# Patient Record
Sex: Male | Born: 1970 | Race: White | Hispanic: No | Marital: Single | Smoking: Current some day smoker
Health system: Southern US, Community
[De-identification: ages and names within clinical notes are randomized; demographics above are authoritative.]

## PROBLEM LIST (undated history)

## (undated) DIAGNOSIS — I509 Heart failure, unspecified: Secondary | ICD-10-CM

## (undated) DIAGNOSIS — M109 Gout, unspecified: Secondary | ICD-10-CM

## (undated) DIAGNOSIS — I10 Essential (primary) hypertension: Secondary | ICD-10-CM

## (undated) HISTORY — PX: HERNIA REPAIR: SHX51

## (undated) HISTORY — DX: Heart failure, unspecified: I50.9

---

## 2007-06-30 ENCOUNTER — Emergency Department (HOSPITAL_COMMUNITY): Admission: EM | Admit: 2007-06-30 | Discharge: 2007-06-30 | Payer: Self-pay | Admitting: Emergency Medicine

## 2008-01-22 ENCOUNTER — Emergency Department (HOSPITAL_COMMUNITY): Admission: EM | Admit: 2008-01-22 | Discharge: 2008-01-22 | Payer: Self-pay | Admitting: Emergency Medicine

## 2008-08-18 ENCOUNTER — Emergency Department (HOSPITAL_COMMUNITY): Admission: EM | Admit: 2008-08-18 | Discharge: 2008-08-19 | Payer: Self-pay | Admitting: Emergency Medicine

## 2008-12-04 ENCOUNTER — Emergency Department (HOSPITAL_COMMUNITY): Admission: EM | Admit: 2008-12-04 | Discharge: 2008-12-04 | Payer: Self-pay | Admitting: Emergency Medicine

## 2009-01-19 ENCOUNTER — Emergency Department (HOSPITAL_COMMUNITY): Admission: EM | Admit: 2009-01-19 | Discharge: 2009-01-19 | Payer: Self-pay | Admitting: Emergency Medicine

## 2010-06-17 ENCOUNTER — Emergency Department (HOSPITAL_COMMUNITY)
Admission: EM | Admit: 2010-06-17 | Discharge: 2010-06-17 | Payer: Self-pay | Source: Home / Self Care | Admitting: Emergency Medicine

## 2010-11-01 LAB — URINALYSIS, ROUTINE W REFLEX MICROSCOPIC
Specific Gravity, Urine: 1.028 (ref 1.005–1.030)
pH: 7.5 (ref 5.0–8.0)

## 2014-10-16 ENCOUNTER — Emergency Department (HOSPITAL_COMMUNITY): Payer: Self-pay

## 2014-10-16 ENCOUNTER — Encounter (HOSPITAL_COMMUNITY): Payer: Self-pay | Admitting: Emergency Medicine

## 2014-10-16 ENCOUNTER — Emergency Department (HOSPITAL_COMMUNITY)
Admission: EM | Admit: 2014-10-16 | Discharge: 2014-10-16 | Disposition: A | Discharge status: Home / Self Care | Payer: Self-pay | Attending: Emergency Medicine | Admitting: Emergency Medicine

## 2014-10-16 DIAGNOSIS — Y9289 Other specified places as the place of occurrence of the external cause: Secondary | ICD-10-CM | POA: Insufficient documentation

## 2014-10-16 DIAGNOSIS — W293XXA Contact with powered garden and outdoor hand tools and machinery, initial encounter: Secondary | ICD-10-CM | POA: Insufficient documentation

## 2014-10-16 DIAGNOSIS — Y998 Other external cause status: Secondary | ICD-10-CM | POA: Insufficient documentation

## 2014-10-16 DIAGNOSIS — S61213A Laceration without foreign body of left middle finger without damage to nail, initial encounter: Secondary | ICD-10-CM | POA: Insufficient documentation

## 2014-10-16 DIAGNOSIS — Z23 Encounter for immunization: Secondary | ICD-10-CM | POA: Insufficient documentation

## 2014-10-16 DIAGNOSIS — Z720 Tobacco use: Secondary | ICD-10-CM | POA: Insufficient documentation

## 2014-10-16 DIAGNOSIS — Y9389 Activity, other specified: Secondary | ICD-10-CM | POA: Insufficient documentation

## 2014-10-16 MED ORDER — CEPHALEXIN 500 MG PO CAPS
500.0000 mg | ORAL_CAPSULE | Freq: Once | ORAL | Status: AC
  Administered 2014-10-16: 500 mg via ORAL
  Filled 2014-10-16: qty 1

## 2014-10-16 MED ORDER — CEPHALEXIN 500 MG PO CAPS: 500.0000 mg | ORAL_CAPSULE | Freq: Four times a day (QID) | ORAL | Status: DC

## 2014-10-16 MED ORDER — OXYCODONE-ACETAMINOPHEN 5-325 MG PO TABS: 1.0000 | ORAL_TABLET | ORAL | Status: DC | PRN

## 2014-10-16 MED ORDER — LIDOCAINE HCL (PF) 1 % IJ SOLN
30.0000 mL | Freq: Once | INTRAMUSCULAR | Status: AC
  Administered 2014-10-16: 30 mL via INTRADERMAL
  Filled 2014-10-16: qty 30

## 2014-10-16 MED ORDER — TETANUS-DIPHTH-ACELL PERTUSSIS 5-2.5-18.5 LF-MCG/0.5 IM SUSP
0.5000 mL | Freq: Once | INTRAMUSCULAR | Status: AC
  Administered 2014-10-16: 0.5 mL via INTRAMUSCULAR
  Filled 2014-10-16: qty 0.5

## 2014-10-16 MED ORDER — OXYCODONE-ACETAMINOPHEN 5-325 MG PO TABS
1.0000 | ORAL_TABLET | Freq: Once | ORAL | Status: AC
Start: 2014-10-16 — End: 2014-10-16
  Administered 2014-10-16: 1 via ORAL
  Filled 2014-10-16: qty 1

## 2014-10-16 NOTE — Discharge Instructions (Signed)
1. Medications: Tylenol or ibuprofen for pain, usual home medications 2. Treatment: ice for swelling, keep wound clean with warm soap and water and keep bandage dry, do not submerge in water for 24 hours 3. Follow Up: Please see Dr. Merlyn LotKuzma in 5 days for wound check or sooner if you have concerns. Return to the emergency department for increased redness, drainage of pus from the wound   WOUND CARE  Keep area clean and dry for 24 hours. Do not remove bandage, if applied.  After 24 hours, remove bandage and wash wound gently with mild soap and warm water. Reapply a new bandage after cleaning wound, if directed.   Continue daily cleansing with soap and water until stitches/staples are removed.  Do not apply any ointments or creams to the wound while stitches/staples are in place, as this may cause delayed healing. Return if you experience any of the following signs of infection: Swelling, redness, pus drainage, streaking, fever >101.0 F  Return if you experience excessive bleeding that does not stop after 15-20 minutes of constant, firm pressure.

## 2014-10-16 NOTE — ED Notes (Signed)
Bed: [id number] Expected date:  Expected time:  Means of arrival:  Comments: For T1

## 2014-10-16 NOTE — ED Notes (Addendum)
Pt using chainsaw to cut a branch and the chainsaw bounced back cutting down left middle finger. Pt diaphoretic and pale.

## 2014-10-16 NOTE — ED Provider Notes (Signed)
CSN: [id number]     Arrival date & time 10/16/14  1506 History   First MD Initiated Contact with Patient 10/16/14 1544     Chief Complaint  Patient presents with  . Finger Injury     (Consider location/radiation/quality/duration/timing/severity/associated sxs/prior Treatment) The history is provided by the patient and medical records. No language interpreter was used.     Kirk Lyons is a 44 y.o. male  with no major medical Hx presents to the Emergency Department complaining of acute laceration to the left little finger onset PTA.  Pt reports he was using a chainsaw when it bounced back and hit his left middle finger.  Pt reports pain to the finger; but no pain to the hand or wrist. He reports full range of motion of all fingers of the hand and wrist. He reports cleaning the wound with water but with large amount of blood reported to the ER for sutures. Unknown last tetanus. Patient denies history of diabetes, steroid use, HIV or other immunosuppression.  He denies history of recurrent infections.  Patient reports he felt "woozy" after it happened but did not pass out.  He reports he was given oxycodone on arrival here in the emergency department which improved his pain significantly and he now feels normal.  Patient denies fever or chills, nausea or vomiting.  History reviewed. No pertinent past medical history. History reviewed. No pertinent past surgical history. No family history on file. History  Substance Use Topics  . Smoking status: Current Some Day Smoker  . Smokeless tobacco: Not on file  . Alcohol Use: Yes     Comment: social    Review of Systems  Constitutional: Negative for fever.  Gastrointestinal: Negative for nausea and vomiting.  Skin: Positive for wound.  Allergic/Immunologic: Negative for immunocompromised state.  Neurological: Negative for weakness and numbness.  Hematological: Does not bruise/bleed easily.  Psychiatric/Behavioral: The patient is not  nervous/anxious.       Allergies  Erythromycin  Home Medications   Prior to Admission medications   Medication Sig Start Date End Date Taking? Authorizing Provider  feeding supplement (ENSURE CLINICAL STRENGTH) LIQD Take 237 mLs by mouth daily as needed (nutrition).   Yes Historical Provider, MD  Multiple Vitamins-Minerals (EMERGEN-C VITAMIN C) PACK Take 1 Package by mouth as needed (immune system support).   Yes Historical Provider, MD  cephALEXin (KEFLEX) 500 MG capsule Take 1 capsule (500 mg total) by mouth 4 (four) times daily. 10/16/14   Rolene Andrades, PA-C  oxyCODONE-acetaminophen (PERCOCET/ROXICET) 5-325 MG per tablet Take 1-2 tablets by mouth every 4 (four) hours as needed for severe pain. 10/16/14   Carman Essick, PA-C   BP 147/102 mmHg  Pulse 80  Temp(Src) 98.1 F (36.7 C) (Oral)  Resp 20  Wt 205 lb (92.987 kg)  SpO2 96% Physical Exam  Constitutional: He is oriented to person, place, and time. He appears well-developed and well-nourished. No distress.  HENT:  Head: Normocephalic and atraumatic.  Eyes: Conjunctivae are normal. No scleral icterus.  Neck: Normal range of motion.  Cardiovascular: Normal rate, regular rhythm, normal heart sounds and intact distal pulses.   No murmur heard. Capillary refill < 3 sec  Pulmonary/Chest: Effort normal. No respiratory distress.  Musculoskeletal: Normal range of motion. He exhibits no edema.  Full range of motion of all fingers on the left hand including left middle finger 9 cm laceration along the medial portion of the left finger  Neurological: He is alert and oriented to person, place,  and time.  Sensation: Intact to dull and sharp throughout finger with subjectively decreased sensation and wound edges Strength: 5/5 with resisted flexion extension abduction and adduction of the left middle finger  Skin: Skin is warm and dry. He is not diaphoretic.  Psychiatric: He has a normal mood and affect.  Nursing note and  vitals reviewed.   ED Course  LACERATION REPAIR Date/Time: 10/16/2014 6:39 PM Performed by: Dierdre Forth Authorized by: Dierdre Forth Consent: Verbal consent obtained. Risks and benefits: risks, benefits and alternatives were discussed Consent given by: patient Patient understanding: patient states understanding of the procedure being performed Patient consent: the patient's understanding of the procedure matches consent given Procedure consent: procedure consent matches procedure scheduled Relevant documents: relevant documents present and verified Site marked: the operative site was marked Imaging studies: imaging studies available Required items: required blood products, implants, devices, and special equipment available Patient identity confirmed: verbally with patient and arm band Time out: Immediately prior to procedure a "time out" was called to verify the correct patient, procedure, equipment, support staff and site/side marked as required. Body area: upper extremity Location details: left long finger Laceration length: 9 cm Foreign bodies: no foreign bodies Tendon involvement: none Nerve involvement: superficial Vascular damage: no Anesthesia: digital block Local anesthetic: lidocaine 1% without epinephrine Anesthetic total: 9 ml Patient sedated: no Preparation: Patient was prepped and draped in the usual sterile fashion. Irrigation solution: saline Irrigation method: syringe Amount of cleaning: extensive Debridement: minimal Wound skin closure material used: 4-0 vicryl rapide. Number of sutures: 10 Technique: simple Approximation: close Approximation difficulty: complex Dressing: pressure dressing and splint Patient tolerance: Patient tolerated the procedure well with no immediate complications   (including critical care time) Labs Review Labs Reviewed - No data to display  Imaging Review Dg Hand Complete Left  10/16/2014   CLINICAL DATA:   Finger injury  EXAM: LEFT HAND - COMPLETE 3+ VIEW  COMPARISON:  None.  FINDINGS: Three views of the left hand submitted. No acute fracture or subluxation. Study is limited by bandage material artifact third finger.  IMPRESSION: No acute fracture or subluxation. Bandage material artifact third finger.   Electronically Signed   By: Natasha Mead M.D.   On: 10/16/2014 15:33     EKG Interpretation None      MDM   Final diagnoses:  Laceration of left middle finger w/o foreign body w/o damage to nail, initial encounter  Contact with chainsaw as cause of accidental injury   Kirk Lyons presents with large laceration to the left middle finger from chainsaw. Nursing note reports patient was diaphoretic and pale on arrival however his skin is warm and dry and he is alert and oriented upon my assessment.  Full range of motion and sensation intact. No evidence of significant nerve damage or tendon displacement. No involvement of the fingernail.  Wound repaired without difficulty. Tetanus updated. Patient will be discharged home with Keflex for prophylaxis.  Dissolvable sutures placed and patient started to follow-up with hand surgery for wound check.  I have personally reviewed patient's vitals, nursing note and any pertinent labs or imaging.  I performed an undressed physical exam.    It has been determined that no acute conditions requiring further emergency intervention are present at this time. The patient/guardian have been advised of the diagnosis and plan. I reviewed all labs and imaging including any potential incidental findings. We have discussed signs and symptoms that warrant return to the ED and they are listed in the  discharge instructions.    Vital signs are stable at discharge.   BP 147/102 mmHg  Pulse 80  Temp(Src) 98.1 F (36.7 C) (Oral)  Resp 20  Wt 205 lb (92.987 kg)  SpO2 96%        Dierdre ForthHannah Jaaliyah Lucatero, PA-C 10/16/14 1844  Tilden FossaElizabeth Rees, MD 10/16/14 2333

## 2015-02-23 ENCOUNTER — Encounter (HOSPITAL_COMMUNITY): Payer: Self-pay | Admitting: Emergency Medicine

## 2015-02-23 ENCOUNTER — Emergency Department (HOSPITAL_COMMUNITY)
Admission: EM | Admit: 2015-02-23 | Discharge: 2015-02-23 | Disposition: A | Discharge status: Home / Self Care | Payer: Self-pay | Attending: Emergency Medicine | Admitting: Emergency Medicine

## 2015-02-23 DIAGNOSIS — K047 Periapical abscess without sinus: Secondary | ICD-10-CM | POA: Insufficient documentation

## 2015-02-23 DIAGNOSIS — Z792 Long term (current) use of antibiotics: Secondary | ICD-10-CM | POA: Insufficient documentation

## 2015-02-23 DIAGNOSIS — Z87891 Personal history of nicotine dependence: Secondary | ICD-10-CM | POA: Insufficient documentation

## 2015-02-23 MED ORDER — TRAMADOL HCL 50 MG PO TABS
50.0000 mg | ORAL_TABLET | Freq: Once | ORAL | Status: AC
  Administered 2015-02-23: 50 mg via ORAL
  Filled 2015-02-23: qty 1

## 2015-02-23 MED ORDER — PENICILLIN V POTASSIUM 500 MG PO TABS
500.0000 mg | ORAL_TABLET | Freq: Once | ORAL | Status: AC
  Administered 2015-02-23: 500 mg via ORAL
  Filled 2015-02-23: qty 1

## 2015-02-23 MED ORDER — PENICILLIN V POTASSIUM 500 MG PO TABS: 500.0000 mg | ORAL_TABLET | Freq: Four times a day (QID) | ORAL | Status: DC

## 2015-02-23 NOTE — ED Notes (Signed)
Pt is c/o dental pain on the left lower side  Pt states he has swelling in his jaw  Pt is c/o pain to his jaw up to his ear and to his eye on the left side

## 2015-02-23 NOTE — ED Notes (Signed)
Bed: WTR9 Expected date:  Expected time:  Means of arrival:  Comments: 

## 2015-02-23 NOTE — Progress Notes (Signed)
EDCM spoke to patient at bedside. Patient confirms he does not have a pcp or insurance living in [place].  EDCM provide patient with pamphlet to [facility], informed patient of services there and walk in times.  EDCM also provided patient with list of pcps who accept self pay patients, list of discount pharmacies and websites needymeds.org and GoodRX.com for medication assistance, phone number to inquire about the orange card, phone number to inquire about Mediciad, phone number to inquire about the Affordable Care Act, financial resources in the community such as local churches, salvation army, urban ministries, and dental assistance for uninsured patients.  Patient thankful for resources.  No further EDCM needs at this time.  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  He reports he has "family money" assisting him.  Patient reports he has his own dentist.  [facility] informed patient that his penicillin rx is free at [facility].

## 2015-02-23 NOTE — Discharge Instructions (Signed)
Dental Abscess A dental abscess is a collection of infected fluid (pus) from a bacterial infection in the inner part of the tooth (pulp). It usually occurs at the end of the tooth's root.  CAUSES   Severe tooth decay.  Trauma to the tooth that allows bacteria to enter into the pulp, such as a broken or chipped tooth. SYMPTOMS   Severe pain in and around the infected tooth.  Swelling and redness around the abscessed tooth or in the mouth or face.  Tenderness.  Pus drainage.  Bad breath.  Bitter taste in the mouth.  Difficulty swallowing.  Difficulty opening the mouth.  Nausea.  Vomiting.  Chills.  Swollen neck glands. DIAGNOSIS   A medical and dental history will be taken.  An examination will be performed by tapping on the abscessed tooth.  X-rays may be taken of the tooth to identify the abscess. TREATMENT The goal of treatment is to eliminate the infection. You may be prescribed antibiotic medicine to stop the infection from spreading. A root canal may be performed to save the tooth. If the tooth cannot be saved, it may be pulled (extracted) and the abscess may be drained.  HOME CARE INSTRUCTIONS  Only take over-the-counter or prescription medicines for pain, fever, or discomfort as directed by your caregiver.  Rinse your mouth (gargle) often with salt water ( tsp salt in 8 oz [250 ml] of warm water) to relieve pain or swelling.  Do not drive after taking pain medicine (narcotics).  Do not apply heat to the outside of your face.  Return to your dentist for further treatment as directed. SEEK MEDICAL CARE IF:  Your pain is not helped by medicine.  Your pain is getting worse instead of better. SEEK IMMEDIATE MEDICAL CARE IF:  You have a fever or persistent symptoms for more than 2-3 days.  You have a fever and your symptoms suddenly get worse.  You have chills or a very bad headache.  You have problems breathing or swallowing.  You have trouble  opening your mouth.  You have swelling in the neck or around the eye. Document Released: 07/03/2005 Document Revised: 03/27/2012 Document Reviewed: 10/11/2010 [facility] Patient Information 2015 [place], [place]. This information is not intended to replace advice given to you by your health care provider. Make sure you discuss any questions you have with your health care provider. You have been given a prescription for penicillin is my understanding that is free at [employer].  You just  need to present the prescription You've also been given a list of resources

## 2015-02-23 NOTE — ED Provider Notes (Signed)
CSN: [id number]     Arrival date & time 02/23/15  2208 History  This chart was scribed for Earley Favor, NP, working with Marily Memos, MD by Octavia Heir, ED Scribe. This patient was seen in room WTR9/WTR9 and the patient's care was started at 10:34 PM.    Chief Complaint  Patient presents with  . Dental Pain      The history is provided by the patient. No language interpreter was used.   HPI Comments: Kirk Lyons is a 44 y.o. male who presents to the Emergency Department complaining of sudden onset, gradual worsening lower left sided dental pain onset 2 weeks ago. He notes associated swelling in his jaw and reports the pain radiates up his left jaw, ear and eye. Pt went to the dentist and was told her had an abscess on the lower left side of his mouth. He was not given an antibiotic and notes he cannot afford to go back.  History reviewed. No pertinent past medical history. History reviewed. No pertinent past surgical history. History reviewed. No pertinent family history. History  Substance Use Topics  . Smoking status: Former Games developer  . Smokeless tobacco: Not on file  . Alcohol Use: Yes     Comment: social    Review of Systems  HENT: Positive for dental problem and facial swelling.   All other systems reviewed and are negative.     Allergies  Erythromycin  Home Medications   Prior to Admission medications   Medication Sig Start Date End Date Taking? Authorizing Provider  cephALEXin (KEFLEX) 500 MG capsule Take 1 capsule (500 mg total) by mouth 4 (four) times daily. 10/16/14   Hannah Muthersbaugh, PA-C  feeding supplement (ENSURE CLINICAL STRENGTH) LIQD Take 237 mLs by mouth daily as needed (nutrition).    Historical Provider, MD  Multiple Vitamins-Minerals (EMERGEN-C VITAMIN C) PACK Take 1 Package by mouth as needed (immune system support).    Historical Provider, MD  oxyCODONE-acetaminophen (PERCOCET/ROXICET) 5-325 MG per tablet Take 1-2 tablets by mouth every 4 (four)  hours as needed for severe pain. 10/16/14   Hannah Muthersbaugh, PA-C   Triage vitals: BP 142/110 mmHg  Pulse 89  Temp(Src) 98.8 F (37.1 C) (Oral)  Resp 18  SpO2 98% Physical Exam  Constitutional: He is oriented to person, place, and time. He appears well-developed and well-nourished. No distress.  HENT:  Head: Normocephalic and atraumatic.  Submental nodes, minimal swelling on the lateral side of the 1st and 2nd molar on left bottom  Eyes: Conjunctivae and EOM are normal.  Neck: Normal range of motion. Neck supple.  Anterior cervical nodes  Cardiovascular: Normal rate, regular rhythm and normal heart sounds.   Pulmonary/Chest: Effort normal and breath sounds normal.  Musculoskeletal: Normal range of motion. He exhibits no edema.  Lymphadenopathy:    He has cervical adenopathy.  Neurological: He is alert and oriented to person, place, and time.  Skin: Skin is warm and dry.  Psychiatric: He has a normal mood and affect. His behavior is normal.  Nursing note and vitals reviewed.   ED Course  Procedures  DIAGNOSTIC STUDIES: Oxygen Saturation is 98% on RA, normal by my interpretation.  COORDINATION OF CARE:  10:36 PM Discussed treatment plan which includes antibiotics and resource list of other dentists with pt at bedside and pt agreed to plan.  Labs Review Labs Reviewed - No data to display  Imaging Review No results found.   EKG Interpretation None     Patient was given  a prescription for penicillin, which from my understanding is free of hair is Agricultural consultant.  He's been informed of this, the case manager has also given him a list of resources MDM   Final diagnoses:  None   I personally performed the services described in this documentation, which was scribed in my presence. The recorded information has been reviewed and is accurate.   Earley Favor, NP 02/23/15 2245  Earley Favor, NP 02/23/15 1610  Marily Memos, MD 02/25/15 [id number]

## 2016-11-23 ENCOUNTER — Emergency Department (HOSPITAL_COMMUNITY)
Admission: EM | Admit: 2016-11-23 | Discharge: 2016-11-23 | Disposition: A | Discharge status: Home / Self Care | Payer: Self-pay | Attending: Emergency Medicine | Admitting: Emergency Medicine

## 2016-11-23 ENCOUNTER — Encounter (HOSPITAL_COMMUNITY): Payer: Self-pay

## 2016-11-23 DIAGNOSIS — F419 Anxiety disorder, unspecified: Secondary | ICD-10-CM | POA: Insufficient documentation

## 2016-11-23 DIAGNOSIS — Z87891 Personal history of nicotine dependence: Secondary | ICD-10-CM | POA: Insufficient documentation

## 2016-11-23 DIAGNOSIS — R0981 Nasal congestion: Secondary | ICD-10-CM | POA: Insufficient documentation

## 2016-11-23 MED ORDER — HYDROXYZINE HCL 50 MG PO TABS: 50.0000 mg | ORAL_TABLET | Freq: Four times a day (QID) | ORAL | 0 refills | Status: DC | PRN

## 2016-11-23 MED ORDER — HYDROXYZINE HCL 25 MG PO TABS
50.0000 mg | ORAL_TABLET | Freq: Once | ORAL | Status: AC
  Administered 2016-11-23: 50 mg via ORAL
  Filled 2016-11-23: qty 2

## 2016-11-23 NOTE — Discharge Instructions (Signed)
Continue to stay well-hydrated. Use vistaril as directed as needed for congestion as well as anxiety. Continue to alternate between Tylenol and Ibuprofen for pain or fever. Use netipot and flonase to help with nasal congestion.  Follow up with [facility] in 5-7 days for recheck of ongoing symptoms and to establish medical care, and for ongoing management of your anxiety and congestion. Return to emergency department for emergent changing or worsening of symptoms.

## 2016-11-23 NOTE — ED Triage Notes (Signed)
Pt from home via EMS with complaints of anxiety x 25 years but states this has gotten worse over the past 3-4 months. Pt states he has untreated sleep apnea and he is scared to sleep which exacerbates his anxiety.
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
## 2016-11-23 NOTE — ED Notes (Signed)
Pt reports greatly decreased anxiety.  Getting ride home w/a friend.

## 2019-05-10 ENCOUNTER — Other Ambulatory Visit: Payer: Self-pay

## 2019-05-10 ENCOUNTER — Encounter: Payer: Self-pay | Admitting: Emergency Medicine

## 2019-05-10 ENCOUNTER — Emergency Department
Admission: EM | Admit: 2019-05-10 | Discharge: 2019-05-10 | Disposition: A | Discharge status: Home / Self Care | Payer: Self-pay | Attending: Emergency Medicine | Admitting: Emergency Medicine

## 2019-05-10 DIAGNOSIS — Z79899 Other long term (current) drug therapy: Secondary | ICD-10-CM | POA: Insufficient documentation

## 2019-05-10 DIAGNOSIS — L03012 Cellulitis of left finger: Secondary | ICD-10-CM | POA: Insufficient documentation

## 2019-05-10 DIAGNOSIS — Z87891 Personal history of nicotine dependence: Secondary | ICD-10-CM | POA: Insufficient documentation

## 2019-05-10 DIAGNOSIS — I10 Essential (primary) hypertension: Secondary | ICD-10-CM | POA: Insufficient documentation

## 2019-05-10 MED ORDER — CEPHALEXIN 500 MG PO CAPS
500.0000 mg | ORAL_CAPSULE | Freq: Once | ORAL | Status: AC
Start: 2019-05-10 — End: 2019-05-10
  Administered 2019-05-10: 21:00:00 500 mg via ORAL
  Filled 2019-05-10: qty 1

## 2019-05-10 MED ORDER — CEPHALEXIN 500 MG PO CAPS: 500.0000 mg | ORAL_CAPSULE | Freq: Three times a day (TID) | ORAL | 0 refills | Status: DC

## 2019-05-10 NOTE — ED Provider Notes (Signed)
[facility] Emergency Department Provider Note ____________________________________________  Time seen: 1936  I have reviewed the triage vital signs and the nursing notes.  HISTORY  Chief Complaint  Hand Pain  HPI Kirk Lyons is a 48 y.o. male presents himself to the ED for evaluation of pain to the  cuticle of the fourth digit on his left hand.  He describes pain over the last 4 days.  He admits that when the cuticle initially got infected, he did with a sterile needle and express purulent drainage.  It was improving until he subsequently bumped the finger, and recollected.  He presents now with significant swelling to the cuticle of the fourth finger.  He denies any other injury at this time.  History reviewed. No pertinent past medical history.  There are no active problems to display for this patient.   Past Surgical History:  Procedure Laterality Date  . HERNIA REPAIR      Prior to Admission medications   Medication Sig Start Date End Date Taking? Authorizing Provider  cephALEXin (KEFLEX) 500 MG capsule Take 1 capsule (500 mg total) by mouth 3 (three) times daily. 05/10/19   Kaarin Pardy, Charlesetta Ivory, PA-C  feeding supplement (ENSURE CLINICAL STRENGTH) LIQD Take 237 mLs by mouth daily as needed (nutrition).    [provider]  Multiple Vitamins-Minerals (EMERGEN-C VITAMIN C) PACK Take 1 Package by mouth as needed (immune system support).    [provider]    Allergies Erythromycin  No family history on file.  Social History Social History   Tobacco Use  . Smoking status: Former Games developer  . Smokeless tobacco: Never Used  Substance Use Topics  . Alcohol use: Yes    Comment: social  . Drug use: No    Review of Systems  Constitutional: Negative for fever. Cardiovascular: Negative for chest pain. Respiratory: Negative for shortness of breath. Musculoskeletal: Negative for back pain.  Left ring finger pain as above. Skin:  Negative for rash. Neurological: Negative for headaches, focal weakness or numbness. ____________________________________________  PHYSICAL EXAM:  VITAL SIGNS: ED Triage Vitals  Enc Vitals Group     BP 05/10/19 1817 (!) 156/100     Pulse Rate 05/10/19 1817 100     Resp 05/10/19 1817 20     Temp 05/10/19 1817 98.7 F (37.1 C)     Temp Source 05/10/19 1817 Oral     SpO2 05/10/19 1817 96 %     Weight 05/10/19 1818 240 lb (108.9 kg)     Height 05/10/19 1818 6\' 2"  (1.88 m)     Head Circumference --      Peak Flow --      Pain Score 05/10/19 1818 6     Pain Loc --      Pain Edu? --      Excl. in GC? --     Constitutional: Alert and oriented. Well appearing and in no distress. Head: Normocephalic and atraumatic. Eyes: Conjunctivae are normal.  Normal extraocular movements Cardiovascular: Normal rate, regular rhythm. Normal distal pulses. Respiratory: Normal respiratory effort. No wheezes/rales/rhonchi. Musculoskeletal: left hand with focal erythema and paronychia to the cuticle. Local edema to the distal finger. Nontender with normal range of motion in all extremities.  Neurologic:  Normal gait without ataxia. Normal speech and language. No gross focal neurologic deficits are appreciated. Skin:  Skin is warm, dry and intact. No rash noted. Psychiatric: Mood and affect are normal. Patient exhibits appropriate insight and judgment. ____________________________________________  PROCEDURES  Keflex 500 mg PO .Marland KitchenIncision and Drainage  Date/Time: 05/10/2019 8:16 PM Performed by: Melvenia Needles, PA-C Authorized by: Melvenia Needles, PA-C   Consent:    Consent obtained:  Verbal   Consent given by:  Patient   Risks discussed:  Bleeding, infection and incomplete drainage   Alternatives discussed:  Alternative treatment Location:    Indications for incision and drainage: paronychia.   Location:  Upper extremity   Upper extremity location:  Finger   Finger location:   L ring finger Pre-procedure details:    Skin preparation:  Chloraprep Anesthesia (see MAR for exact dosages):    Anesthesia method:  None Procedure type:    Complexity:  Simple Procedure details:    Needle aspiration: no     Incision types:  Stab incision   Incision depth:  Dermal   Scalpel size: 18 G needle.   Wound management:  Irrigated with saline   Drainage:  Purulent   Drainage amount:  Scant   Wound treatment:  Wound left open   Packing materials:  None Post-procedure details:    Patient tolerance of procedure:  Tolerated well, no immediate complications  ____________________________________________  INITIAL IMPRESSION / ASSESSMENT AND PLAN / ED COURSE  Patient with ED evaluation of left finger pain and swelling at the cuticle.  Patient presents with focal pus collection to the cuticle and surrounding erythema.  The paronychia is drained using a local incision with a 19-gauge needle.  Patient is placed on Keflex empirically, because his injury has been persistent and recurrent for the last 4 weeks.  He will keep the wound clean, dry, covered, and return to the ED as needed. He will return to the ED for further evaluation of BP. He declined treatment at this time.   Kirk Lyons was evaluated in Emergency Department on 05/10/2019 for the symptoms described in the history of present illness. He was evaluated in the context of the global COVID-19 pandemic, which necessitated consideration that the patient might be at risk for infection with the SARS-CoV-2 virus that causes COVID-19. Institutional protocols and algorithms that pertain to the evaluation of patients at risk for COVID-19 are in a state of rapid change based on information released by regulatory bodies including the CDC and federal and state organizations. These policies and algorithms were followed during the patient's care in the ED. ____________________________________________  FINAL CLINICAL IMPRESSION(S) / ED  DIAGNOSES  Final diagnoses:  Paronychia of left ring finger  Uncontrolled hypertension      Jennye Runquist, Dannielle Karvonen, PA-C 05/10/19 2052    Duffy Bruce, MD 05/12/19 [phone]

## 2019-05-10 NOTE — ED Triage Notes (Signed)
Pain and swelling around 4th digit nailbed L hand x 4 days.

## 2019-05-10 NOTE — Discharge Instructions (Signed)
Keep the wound clean, dry, and covered. Take the antibiotic as directed. Consider epsom-salt soaks to promote healing. Return as needed for worsening swelling to the finger pad.

## 2019-06-11 ENCOUNTER — Encounter: Payer: Self-pay | Admitting: Emergency Medicine

## 2019-06-11 ENCOUNTER — Other Ambulatory Visit: Payer: Self-pay

## 2019-06-11 DIAGNOSIS — Z79899 Other long term (current) drug therapy: Secondary | ICD-10-CM | POA: Insufficient documentation

## 2019-06-11 DIAGNOSIS — M549 Dorsalgia, unspecified: Secondary | ICD-10-CM | POA: Insufficient documentation

## 2019-06-11 DIAGNOSIS — Z87891 Personal history of nicotine dependence: Secondary | ICD-10-CM | POA: Insufficient documentation

## 2019-06-11 DIAGNOSIS — I10 Essential (primary) hypertension: Secondary | ICD-10-CM | POA: Insufficient documentation

## 2019-06-11 DIAGNOSIS — J189 Pneumonia, unspecified organism: Secondary | ICD-10-CM | POA: Insufficient documentation

## 2019-06-11 LAB — BASIC METABOLIC PANEL
Anion gap: 11 (ref 5–15)
BUN: 12 mg/dL (ref 6–20)
CO2: 25 mmol/L (ref 22–32)
Calcium: 8.9 mg/dL (ref 8.9–10.3)
Chloride: 104 mmol/L (ref 98–111)
Creatinine, Ser: 1.02 mg/dL (ref 0.61–1.24)
GFR calc Af Amer: >60 mL/min (ref >60)
GFR calc non Af Amer: >60 mL/min (ref >60)
Glucose, Bld: 138 mg/dL — ABNORMAL HIGH (ref 70–99)
Potassium: 3.8 mmol/L (ref 3.5–5.1)
Sodium: 140 mmol/L (ref 135–145)

## 2019-06-11 LAB — CBC
HCT: 47.6 % (ref 39.0–52.0)
Hemoglobin: 16.2 g/dL (ref 13.0–17.0)
MCH: 32 pg (ref 26.0–34.0)
MCHC: 34 g/dL (ref 30.0–36.0)
MCV: 94.1 fL (ref 80.0–100.0)
Platelets: 342 10*3/uL (ref 150–400)
RBC: 5.06 MIL/uL (ref 4.22–5.81)
RDW: 11.9 % (ref 11.5–15.5)
WBC: 11.1 10*3/uL — ABNORMAL HIGH (ref 4.0–10.5)
nRBC: 0 % (ref 0.0–0.2)

## 2019-06-11 LAB — TROPONIN I (HIGH SENSITIVITY): Troponin I (High Sensitivity): 13 ng/L (ref <18)

## 2019-06-11 NOTE — ED Triage Notes (Signed)
Pt c/o mid back pain, states hx of pneumonia and feels the same. Pt states fever this am. Denies SOB

## 2019-06-12 ENCOUNTER — Emergency Department
Admission: EM | Admit: 2019-06-12 | Discharge: 2019-06-12 | Disposition: A | Discharge status: Home / Self Care | Payer: Self-pay | Attending: Emergency Medicine | Admitting: Emergency Medicine

## 2019-06-12 ENCOUNTER — Emergency Department: Payer: Self-pay

## 2019-06-12 DIAGNOSIS — R509 Fever, unspecified: Secondary | ICD-10-CM

## 2019-06-12 DIAGNOSIS — R0602 Shortness of breath: Secondary | ICD-10-CM

## 2019-06-12 DIAGNOSIS — I10 Essential (primary) hypertension: Secondary | ICD-10-CM

## 2019-06-12 DIAGNOSIS — J189 Pneumonia, unspecified organism: Secondary | ICD-10-CM

## 2019-06-12 LAB — TROPONIN I (HIGH SENSITIVITY): Troponin I (High Sensitivity): 10 ng/L (ref <18)

## 2019-06-12 LAB — URINALYSIS, COMPLETE (UACMP) WITH MICROSCOPIC
Bacteria, UA: NONE SEEN
Bilirubin Urine: NEGATIVE
Glucose, UA: NEGATIVE mg/dL
Hgb urine dipstick: NEGATIVE
Ketones, ur: NEGATIVE mg/dL
Leukocytes,Ua: NEGATIVE
Nitrite: NEGATIVE
Protein, ur: 100 mg/dL — AB
Specific Gravity, Urine: 1.024 (ref 1.005–1.030)
pH: 5 (ref 5.0–8.0)

## 2019-06-12 LAB — LACTIC ACID, PLASMA: Lactic Acid, Venous: 1.5 mmol/L (ref 0.5–1.9)

## 2019-06-12 MED ORDER — HYDROCOD POLST-CPM POLST ER 10-8 MG/5ML PO SUER
5.0000 mL | Freq: Once | ORAL | Status: AC
  Administered 2019-06-12: 5 mL via ORAL
  Filled 2019-06-12: qty 5

## 2019-06-12 MED ORDER — HYDROCOD POLST-CPM POLST ER 10-8 MG/5ML PO SUER: 5.0000 mL | Freq: Two times a day (BID) | ORAL | 0 refills | Status: DC

## 2019-06-12 MED ORDER — SODIUM CHLORIDE 0.9 % IV BOLUS
1000.0000 mL | Freq: Once | INTRAVENOUS | Status: AC
  Administered 2019-06-12: 06:00:00 1000 mL via INTRAVENOUS

## 2019-06-12 MED ORDER — ALBUTEROL SULFATE HFA 108 (90 BASE) MCG/ACT IN AERS: 2.0000 | INHALATION_SPRAY | RESPIRATORY_TRACT | 0 refills | Status: DC | PRN

## 2019-06-12 MED ORDER — SODIUM CHLORIDE 0.9 % IV SOLN
1.0000 g | Freq: Once | INTRAVENOUS | Status: AC
  Administered 2019-06-12: 06:00:00 1 g via INTRAVENOUS
  Filled 2019-06-12: qty 10

## 2019-06-12 MED ORDER — AMOXICILLIN-POT CLAVULANATE 875-125 MG PO TABS: 1.0000 | ORAL_TABLET | Freq: Two times a day (BID) | ORAL | 0 refills | Status: DC

## 2019-06-12 NOTE — ED Notes (Signed)
Pt placed on 2L due to desat while sleeping. Pt awakens easily, once again states hx of sleep apnea.

## 2019-06-12 NOTE — ED Notes (Signed)
Pt awake and calling for a ride at this time.

## 2019-06-12 NOTE — ED Notes (Signed)
Pt verbalizes understanding of d/c instructions, medications and follow up 

## 2019-06-12 NOTE — Discharge Instructions (Addendum)
1.  Take antibiotic as prescribed (Augmentin 875 mg twice daily x7 days). 2.  Please recheck your blood pressure after you are feeling better. 3.  You may use Tussionex as needed for cough. 4.  Use Albuterol inhaler 2 puffs every 4 hours as needed for cough/wheezing/difficulty breathing. 5.  Return to the ER for worsening symptoms, persistent vomiting, difficulty breathing or other concerns.

## 2019-06-12 NOTE — ED Notes (Addendum)
This RN at bedside at this time obtaining D/C VS. Pt noted to be 88-92% on RA. EDP made aware. Per EDP leave pt off O2 and he will come to bedside to assess patient. EDP at also aware that patient remains hypertensive at this time, per EDP follow up with PCP.

## 2019-06-12 NOTE — ED Notes (Signed)
This RN to bedside at this time, introduced self to patient. Pt noted to be asleep, states hx of sleep apnea, c/o HA, states "it's everyday when I wake up I have a headache because of the sleep apnea". Pt denies any further needs, upon awakening pt's O2 sats back to 95%.

## 2019-06-12 NOTE — ED Provider Notes (Signed)
[facility] Emergency Department Provider Note   ____________________________________________   First MD Initiated Contact with Patient 06/12/19 [phone]     (approximate)  I have reviewed the triage vital signs and the nursing notes.   HISTORY  Chief Complaint Back Pain and Fever    HPI Kirk Lyons is a 48 y.o. male who presents to the ED from home with a chief complaint of mid back pain.  Patient reports a 1.5-week history of cough and chest tightness.  Subjective fever and mid back pain today.  History of pneumonia with similar symptoms.  Denies COVID-19 exposure.  Denies wheezing, shortness of breath, abdominal pain, nausea, vomiting or diarrhea.  Denies recent travel or trauma.       Past medical history Pneumonia  There are no active problems to display for this patient.   Past Surgical History:  Procedure Laterality Date  . HERNIA REPAIR      Prior to Admission medications   Medication Sig Start Date End Date Taking? Authorizing Provider  albuterol (VENTOLIN HFA) 108 (90 Base) MCG/ACT inhaler Inhale 2 puffs into the lungs every 4 (four) hours as needed for wheezing or shortness of breath. 06/12/19   Irean Hong, MD  amoxicillin-clavulanate (AUGMENTIN) 875-125 MG tablet Take 1 tablet by mouth 2 (two) times daily. 06/12/19   Irean Hong, MD  cephALEXin (KEFLEX) 500 MG capsule Take 1 capsule (500 mg total) by mouth 3 (three) times daily. 05/10/19   Menshew, Charlesetta Ivory, PA-C  chlorpheniramine-HYDROcodone (TUSSIONEX PENNKINETIC ER) 10-8 MG/5ML SUER Take 5 mLs by mouth 2 (two) times daily. 06/12/19   Irean Hong, MD  feeding supplement (ENSURE CLINICAL STRENGTH) LIQD Take 237 mLs by mouth daily as needed (nutrition).    [provider]  Multiple Vitamins-Minerals (EMERGEN-C VITAMIN C) PACK Take 1 Package by mouth as needed (immune system support).    [provider]    Allergies Erythromycin  No family history on file.   Social History Social History   Tobacco Use  . Smoking status: Former Games developer  . Smokeless tobacco: Never Used  Substance Use Topics  . Alcohol use: Yes    Comment: social  . Drug use: No    Review of Systems  Constitutional: Positive for subjective fever. Eyes: No visual changes. ENT: No sore throat. Cardiovascular: Positive for chest tightness. Respiratory: Positive for nonproductive cough.  Denies shortness of breath. Gastrointestinal: No abdominal pain.  No nausea, no vomiting.  No diarrhea.  No constipation. Genitourinary: Negative for dysuria. Musculoskeletal: Negative for back pain. Skin: Negative for rash. Neurological: Negative for headaches, focal weakness or numbness.   ____________________________________________   PHYSICAL EXAM:  VITAL SIGNS: ED Triage Vitals  Enc Vitals Group     BP 06/11/19 2207 (!) 147/98     Pulse Rate 06/11/19 2207 (!) 115     Resp 06/11/19 2207 16     Temp 06/11/19 2207 98.4 F (36.9 C)     Temp Source 06/11/19 2207 Oral     SpO2 06/11/19 2207 94 %     Weight --      Height --      Head Circumference --      Peak Flow --      Pain Score 06/11/19 2205 3     Pain Loc --      Pain Edu? --      Excl. in GC? --     Constitutional: Alert and oriented. Well appearing and in  no acute distress. Eyes: Conjunctivae are normal. PERRL. EOMI. Head: Atraumatic. Nose: No congestion/rhinnorhea. Mouth/Throat: Mucous membranes are moist.  Oropharynx non-erythematous. Neck: No stridor.  Supple neck without meningismus. Cardiovascular: Normal rate, regular rhythm. Grossly normal heart sounds.  Good peripheral circulation. Respiratory: Normal respiratory effort.  No retractions. Lungs slightly diminished bibasilarly. Gastrointestinal: Soft and nontender to light or deep palpation. No distention. No abdominal bruits. No CVA tenderness. Musculoskeletal: No lower extremity tenderness nor edema.  No joint effusions. Neurologic:  Normal speech  and language. No gross focal neurologic deficits are appreciated. No gait instability. Skin:  Skin is warm, dry and intact. No rash noted.  No petechiae. Psychiatric: Mood and affect are normal. Speech and behavior are normal.  ____________________________________________   LABS (all labs ordered are listed, but only abnormal results are displayed)  Labs Reviewed  BASIC METABOLIC PANEL - Abnormal; Notable for the following components:      Result Value   Glucose, Bld 138 (*)    All other components within normal limits  CBC - Abnormal; Notable for the following components:   WBC 11.1 (*)    All other components within normal limits  URINALYSIS, COMPLETE (UACMP) WITH MICROSCOPIC - Abnormal; Notable for the following components:   Color, Urine YELLOW (*)    APPearance HAZY (*)    Protein, ur 100 (*)    All other components within normal limits  CULTURE, BLOOD (ROUTINE X 2)  CULTURE, BLOOD (ROUTINE X 2)  LACTIC ACID, PLASMA  LACTIC ACID, PLASMA  TROPONIN I (HIGH SENSITIVITY)  TROPONIN I (HIGH SENSITIVITY)  TROPONIN I (HIGH SENSITIVITY)   ____________________________________________  EKG  ED ECG REPORT I, , J, the attending physician, personally viewed and interpreted this ECG.   Date: 06/12/2019  EKG Time: 2210  Rate: 117  Rhythm: sinus tachycardia  Axis: Normal  Intervals:none  ST&T Change: Nonspecific  ____________________________________________  RADIOLOGY  ED MD interpretation: Bibasilar pneumonia  Official radiology report(s): Dg Chest 1 View  Result Date: 06/12/2019 CLINICAL DATA:  Mid back pain, history of pneumonia with similar sensation EXAM: CHEST  1 VIEW COMPARISON:  Radiograph 08/18/2008 FINDINGS: Streaky areas of opacity are present in the bases with some mild airways thickening. More bandlike opacity in the right lung base is compatible with subsegmental atelectasis. No pneumothorax or effusion. The cardiomediastinal contours are unremarkable.  No acute osseous or soft tissue abnormality. IMPRESSION: 1. Streaky areas of opacity in the lung bases could reflect atelectasis or early infection. 2. Subsegmental atelectasis in the right lung base. Electronically Signed   By: Lovena Le M.D.   On: 06/12/2019 04:33    ____________________________________________   PROCEDURES  Procedure(s) performed (including Critical Care):  Procedures   ____________________________________________   INITIAL IMPRESSION / ASSESSMENT AND PLAN / ED COURSE  As part of my medical decision making, I reviewed the following data within the [facility] notes reviewed and incorporated, Labs reviewed, EKG interpreted, Old chart reviewed, Radiograph reviewed and Notes from prior ED visits     Kirk Lyons was evaluated in Emergency Department on 06/12/2019 for the symptoms described in the history of present illness. He was evaluated in the context of the global COVID-19 pandemic, which necessitated consideration that the patient might be at risk for infection with the SARS-CoV-2 virus that causes COVID-19. Institutional protocols and algorithms that pertain to the evaluation of patients at risk for COVID-19 are in a state of rapid change based on information released by regulatory bodies including the CDC  and federal and state organizations. These policies and algorithms were followed during the patient's care in the ED.    48 year old otherwise healthy male who presents to the ED with a 1.5-week history of cough, subjective fever and mid back pain today. Differential includes, but is not limited to, viral syndrome, bronchitis including COPD exacerbation, pneumonia, reactive airway disease including asthma, CHF including exacerbation with or without pulmonary/interstitial edema, pneumothorax, ACS, thoracic trauma, and pulmonary embolism.  Laboratory results unremarkable.  Will repeat times troponin.  Chest x-ray suspicious for  developing bibasilar infiltrates.  Will obtain blood cultures x2, lactic acid.  Initiate IV antibiotics with Rocephin (patient is allergic to Ezithromycin so we will hold off giving Azithromycin).   Clinical Course as of Jun 11 700  Thu Jun 12, 2019  [id number] Repeat troponin unremarkable.  Patient will be discharged after completion of IV antibiotics.  He will be discharged home on Augmentin, Tussionex, albuterol inhaler as needed.  He will have his blood pressure rechecked once he is over this illness.  Strict return precautions given.  Patient verbalizes understanding and agrees with plan of care.   [JS]    Clinical Course User Index [JS] Irean HongSung,  J, MD     ____________________________________________   FINAL CLINICAL IMPRESSION(S) / ED DIAGNOSES  Final diagnoses:  Community acquired pneumonia, unspecified laterality  Essential hypertension     ED Discharge Orders         Ordered    amoxicillin-clavulanate (AUGMENTIN) 875-125 MG tablet  2 times daily     06/12/19 0655    albuterol (VENTOLIN HFA) 108 (90 Base) MCG/ACT inhaler  Every 4 hours PRN     06/12/19 0656    chlorpheniramine-HYDROcodone (TUSSIONEX PENNKINETIC ER) 10-8 MG/5ML SUER  2 times daily     06/12/19 [id number]           Note:  This document was prepared using Dragon voice recognition software and may include unintentional dictation errors.   Irean HongSung,  J, MD 06/12/19 [phone]

## 2019-06-17 LAB — CULTURE, BLOOD (ROUTINE X 2)
Culture: NO GROWTH
Culture: NO GROWTH
Special Requests: ADEQUATE
Special Requests: ADEQUATE

## 2019-07-27 ENCOUNTER — Other Ambulatory Visit: Payer: Self-pay

## 2019-07-27 ENCOUNTER — Emergency Department: Payer: 59

## 2019-07-27 DIAGNOSIS — Z87891 Personal history of nicotine dependence: Secondary | ICD-10-CM | POA: Insufficient documentation

## 2019-07-27 DIAGNOSIS — Z20822 Contact with and (suspected) exposure to covid-19: Secondary | ICD-10-CM | POA: Insufficient documentation

## 2019-07-27 DIAGNOSIS — I10 Essential (primary) hypertension: Secondary | ICD-10-CM | POA: Diagnosis not present

## 2019-07-27 DIAGNOSIS — R0602 Shortness of breath: Secondary | ICD-10-CM | POA: Insufficient documentation

## 2019-07-27 DIAGNOSIS — H9209 Otalgia, unspecified ear: Secondary | ICD-10-CM | POA: Diagnosis present

## 2019-07-27 DIAGNOSIS — Z79899 Other long term (current) drug therapy: Secondary | ICD-10-CM | POA: Diagnosis not present

## 2019-07-27 DIAGNOSIS — J029 Acute pharyngitis, unspecified: Secondary | ICD-10-CM | POA: Diagnosis not present

## 2019-07-27 DIAGNOSIS — H6991 Unspecified Eustachian tube disorder, right ear: Secondary | ICD-10-CM | POA: Insufficient documentation

## 2019-07-27 NOTE — ED Triage Notes (Signed)
Patient states he is short of breath, has bilateral ear pain and history of pneumonia.

## 2019-07-28 ENCOUNTER — Emergency Department
Admission: EM | Admit: 2019-07-28 | Discharge: 2019-07-28 | Disposition: A | Discharge status: Home / Self Care | Payer: 59 | Attending: Emergency Medicine | Admitting: Emergency Medicine

## 2019-07-28 DIAGNOSIS — H6981 Other specified disorders of Eustachian tube, right ear: Secondary | ICD-10-CM

## 2019-07-28 DIAGNOSIS — I10 Essential (primary) hypertension: Secondary | ICD-10-CM

## 2019-07-28 DIAGNOSIS — R0602 Shortness of breath: Secondary | ICD-10-CM

## 2019-07-28 DIAGNOSIS — J029 Acute pharyngitis, unspecified: Secondary | ICD-10-CM

## 2019-07-28 LAB — CBC
HCT: 50 % (ref 39.0–52.0)
Hemoglobin: 17.1 g/dL — ABNORMAL HIGH (ref 13.0–17.0)
MCH: 32 pg (ref 26.0–34.0)
MCHC: 34.2 g/dL (ref 30.0–36.0)
MCV: 93.5 fL (ref 80.0–100.0)
Platelets: 288 10*3/uL (ref 150–400)
RBC: 5.35 MIL/uL (ref 4.22–5.81)
RDW: 12.1 % (ref 11.5–15.5)
WBC: 10 10*3/uL (ref 4.0–10.5)
nRBC: 0 % (ref 0.0–0.2)

## 2019-07-28 LAB — COMPREHENSIVE METABOLIC PANEL
ALT: 37 U/L (ref 0–44)
AST: 33 U/L (ref 15–41)
Albumin: 4.1 g/dL (ref 3.5–5.0)
Alkaline Phosphatase: 62 U/L (ref 38–126)
Anion gap: 13 (ref 5–15)
BUN: 14 mg/dL (ref 6–20)
CO2: 23 mmol/L (ref 22–32)
Calcium: 9.2 mg/dL (ref 8.9–10.3)
Chloride: 103 mmol/L (ref 98–111)
Creatinine, Ser: 1.3 mg/dL — ABNORMAL HIGH (ref 0.61–1.24)
GFR calc Af Amer: >60 mL/min (ref >60)
GFR calc non Af Amer: >60 mL/min (ref >60)
Glucose, Bld: 180 mg/dL — ABNORMAL HIGH (ref 70–99)
Potassium: 4.1 mmol/L (ref 3.5–5.1)
Sodium: 139 mmol/L (ref 135–145)
Total Bilirubin: 0.7 mg/dL (ref 0.3–1.2)
Total Protein: 7.5 g/dL (ref 6.5–8.1)

## 2019-07-28 LAB — TROPONIN I (HIGH SENSITIVITY)
Troponin I (High Sensitivity): 12 ng/L (ref <18)
Troponin I (High Sensitivity): 13 ng/L (ref <18)

## 2019-07-28 MED ORDER — AMOXICILLIN 500 MG PO CAPS: 500.0000 mg | ORAL_CAPSULE | Freq: Three times a day (TID) | ORAL | 0 refills | Status: DC

## 2019-07-28 MED ORDER — HYDROCOD POLST-CPM POLST ER 10-8 MG/5ML PO SUER: 5.0000 mL | Freq: Two times a day (BID) | ORAL | 0 refills | Status: DC

## 2019-07-28 MED ORDER — PREDNISONE 20 MG PO TABS
60.0000 mg | ORAL_TABLET | Freq: Once | ORAL | Status: AC
  Administered 2019-07-28: 60 mg via ORAL
  Filled 2019-07-28: qty 3

## 2019-07-28 MED ORDER — METHYLPREDNISOLONE 4 MG PO TBPK: ORAL_TABLET | ORAL | 0 refills | Status: DC

## 2019-07-28 MED ORDER — AMOXICILLIN 500 MG PO CAPS
500.0000 mg | ORAL_CAPSULE | Freq: Once | ORAL | Status: AC
  Administered 2019-07-28: 500 mg via ORAL
  Filled 2019-07-28: qty 1

## 2019-07-28 NOTE — ED Provider Notes (Signed)
[facility] Emergency Department Provider Note   ____________________________________________   First MD Initiated Contact with Patient 07/28/19 0157     (approximate)  I have reviewed the triage vital signs and the nursing notes.   HISTORY  Chief Complaint Otalgia and Shortness of Breath ("I'm panting")    HPI Kirk Lyons is a 49 y.o. male who presents to the ED from home with a chief complaint of shortness of breath, ear pain, sore throat for several days.  States he has a history of pneumonia and was seen in the ED approximately 1 month ago.  Prescribed doxycycline, prednisone and amlodipine.  He only took the doxycycline because he did not have insurance at the time.  The other prescriptions are still at the pharmacy.  His insurance kicked in today and he will be able to pick the other medicines up.  Also complaining of cough.  Denies fever, chest pain, abdominal pain, nausea, vomiting, diarrhea.  To his knowledge, patient has not had Covid 19 exposure.      Past medical history Hypertension  There are no problems to display for this patient.   Past Surgical History:  Procedure Laterality Date  . HERNIA REPAIR      Prior to Admission medications   Medication Sig Start Date End Date Taking? Authorizing Provider  albuterol (VENTOLIN HFA) 108 (90 Base) MCG/ACT inhaler Inhale 2 puffs into the lungs every 4 (four) hours as needed for wheezing or shortness of breath. 06/12/19   Irean Hong, MD  amoxicillin-clavulanate (AUGMENTIN) 875-125 MG tablet Take 1 tablet by mouth 2 (two) times daily. 06/12/19   Irean Hong, MD  cephALEXin (KEFLEX) 500 MG capsule Take 1 capsule (500 mg total) by mouth 3 (three) times daily. 05/10/19   Menshew, Charlesetta Ivory, PA-C  chlorpheniramine-HYDROcodone (TUSSIONEX PENNKINETIC ER) 10-8 MG/5ML SUER Take 5 mLs by mouth 2 (two) times daily. 06/12/19   Irean Hong, MD  feeding supplement (ENSURE CLINICAL STRENGTH) LIQD  Take 237 mLs by mouth daily as needed (nutrition).    [provider]  Multiple Vitamins-Minerals (EMERGEN-C VITAMIN C) PACK Take 1 Package by mouth as needed (immune system support).    [provider]    Allergies Erythromycin  No family history on file.  Social History Social History   Tobacco Use  . Smoking status: Former Games developer  . Smokeless tobacco: Never Used  Substance Use Topics  . Alcohol use: Yes    Comment: social  . Drug use: No    Review of Systems  Constitutional: No fever/chills Eyes: No visual changes. ENT: Positive for ear pain and sore throat. Cardiovascular: Denies chest pain. Respiratory: Positive for cough and shortness of breath. Gastrointestinal: No abdominal pain.  No nausea, no vomiting.  No diarrhea.  No constipation. Genitourinary: Negative for dysuria. Musculoskeletal: Negative for back pain. Skin: Negative for rash. Neurological: Negative for headaches, focal weakness or numbness.   ____________________________________________   PHYSICAL EXAM:  VITAL SIGNS: ED Triage Vitals [07/27/19 2335]  Enc Vitals Group     BP (!) 151/106     Pulse Rate (!) 117     Resp 20     Temp 97.7 F (36.5 C)     Temp Source Oral     SpO2 94 %     Weight 250 lb (113.4 kg)     Height 6\' 2"  (1.88 m)     Head Circumference      Peak Flow  Pain Score 4     Pain Loc      Pain Edu?      Excl. in GC?     Constitutional: Alert and oriented. Well appearing and in no acute distress. Eyes: Conjunctivae are normal. PERRL. EOMI. Head: Atraumatic. Ears: Fluid behind right TM. Nose: No congestion/rhinnorhea. Mouth/Throat: Mucous membranes are moist.  Oropharynx moderately erythematous without tonsillar exudate, swelling or peritonsillar abscess.  There is no hoarse or muffled voice.  There is no drooling. Neck: No stridor.  Supple neck without meningismus. Hematological/Lymphatic/Immunilogical: Shotty anterior right cervical  lymphadenopathy. Cardiovascular: Normal rate, regular rhythm. Grossly normal heart sounds.  Good peripheral circulation. Respiratory: Normal respiratory effort.  No retractions. Lungs CTAB. Gastrointestinal: Soft and nontender. No distention. No abdominal bruits. No CVA tenderness. Musculoskeletal: No lower extremity tenderness nor edema.  No joint effusions. Neurologic:  Normal speech and language. No gross focal neurologic deficits are appreciated. No gait instability. Skin:  Skin is warm, dry and intact. No rash noted.  No petechiae. Psychiatric: Mood and affect are normal. Speech and behavior are normal.  ____________________________________________   LABS (all labs ordered are listed, but only abnormal results are displayed)  Labs Reviewed  CBC - Abnormal; Notable for the following components:      Result Value   Hemoglobin 17.1 (*)    All other components within normal limits  COMPREHENSIVE METABOLIC PANEL - Abnormal; Notable for the following components:   Glucose, Bld 180 (*)    Creatinine, Ser 1.30 (*)    All other components within normal limits  NOVEL CORONAVIRUS, NAA (HOSP ORDER, SEND-OUT TO REF LAB; TAT 18-24 HRS)  TROPONIN I (HIGH SENSITIVITY)  TROPONIN I (HIGH SENSITIVITY)   ____________________________________________  EKG  ED ECG REPORT I, Latria Mccarron J, the attending physician, personally viewed and interpreted this ECG.   Date: 07/28/2019  EKG Time: 2335  Rate: 122  Rhythm: sinus tachycardia  Axis: Normal  Intervals:none  ST&T Change: Nonspecific  ____________________________________________  RADIOLOGY  ED MD interpretation: No acute cardiopulmonary process  Official radiology report(s): DG Chest 2 View  Result Date: 07/28/2019 CLINICAL DATA:  Shortness of breath EXAM: CHEST - 2 VIEW COMPARISON:  06/12/2019 FINDINGS: The heart size and mediastinal contours are within normal limits. Both lungs are clear. The visualized skeletal structures are  unremarkable. IMPRESSION: No active cardiopulmonary disease. Electronically Signed   By: Deatra Robinson M.D.   On: 07/28/2019 00:09    ____________________________________________   PROCEDURES  Procedure(s) performed (including Critical Care):  Procedures   ____________________________________________   INITIAL IMPRESSION / ASSESSMENT AND PLAN / ED COURSE  As part of my medical decision making, I reviewed the following data within the electronic MEDICAL RECORD NUMBER Nursing notes reviewed and incorporated, Labs reviewed, EKG interpreted, Old chart reviewed, Radiograph reviewed and Notes from prior ED visits     Kirk Lyons was evaluated in Emergency Department on 07/28/2019 for the symptoms described in the history of present illness. He was evaluated in the context of the global COVID-19 pandemic, which necessitated consideration that the patient might be at risk for infection with the SARS-CoV-2 virus that causes COVID-19. Institutional protocols and algorithms that pertain to the evaluation of patients at risk for COVID-19 are in a state of rapid change based on information released by regulatory bodies including the CDC and federal and state organizations. These policies and algorithms were followed during the patient's care in the ED.    49 year old male who presents with sore throat, ear pain,  cough and shortness of breath. Differential includes, but is not limited to, viral syndrome, bronchitis including COPD exacerbation, pneumonia, reactive airway disease including asthma, CHF including exacerbation with or without pulmonary/interstitial edema, pneumothorax, ACS, thoracic trauma, and pulmonary embolism.  Laboratory results notable for AKI compared to previous.  Repeat troponins unremarkable.  Chest x-ray does not indicate pneumonia.  Will place on prednisone for eustachian tube dysfunction, Tussionex for cough, amoxicillin for pharyngitis.  Obtain send out Covid swab.  Encourage  patient to pick up his amlodipine prescription from the pharmacy and to started for hypertension.  Strict return precautions given.  Patient verbalizes understanding and agrees with plan of care.      ____________________________________________   FINAL CLINICAL IMPRESSION(S) / ED DIAGNOSES  Final diagnoses:  Eustachian tube dysfunction, right  Pharyngitis, unspecified etiology  Essential hypertension  SOB (shortness of breath)     ED Discharge Orders    None       Note:  This document was prepared using Dragon voice recognition software and may include unintentional dictation errors.   Paulette Blanch, MD 07/28/19 [phone]

## 2019-07-28 NOTE — Discharge Instructions (Signed)
1.  Quarantine yourself until you receive the results of your COVID-19 swab. 2.  Take antibiotic as prescribed (Amoxicillin 500 mg 3 times daily x7 days). 3.  Take steroid as prescribed (Medrol Dosepak). 4.  You may take cough medicine as needed (Tussionex). 5.  I encourage you to pick up your blood pressure medicine from the pharmacy. 6.  Return to the ER for worsening symptoms, persistent vomiting, difficulty breathing or other concerns.

## 2019-07-29 LAB — NOVEL CORONAVIRUS, NAA (HOSP ORDER, SEND-OUT TO REF LAB; TAT 18-24 HRS): SARS-CoV-2, NAA: NOT DETECTED

## 2019-08-01 ENCOUNTER — Other Ambulatory Visit: Payer: Self-pay

## 2019-08-01 ENCOUNTER — Emergency Department
Admission: EM | Admit: 2019-08-01 | Discharge: 2019-08-01 | Disposition: A | Discharge status: Home / Self Care | Payer: 59 | Attending: Emergency Medicine | Admitting: Emergency Medicine

## 2019-08-01 DIAGNOSIS — M10071 Idiopathic gout, right ankle and foot: Secondary | ICD-10-CM | POA: Insufficient documentation

## 2019-08-01 DIAGNOSIS — M79674 Pain in right toe(s): Secondary | ICD-10-CM | POA: Diagnosis present

## 2019-08-01 DIAGNOSIS — Z79899 Other long term (current) drug therapy: Secondary | ICD-10-CM | POA: Insufficient documentation

## 2019-08-01 DIAGNOSIS — F17200 Nicotine dependence, unspecified, uncomplicated: Secondary | ICD-10-CM | POA: Insufficient documentation

## 2019-08-01 LAB — CBC WITH DIFFERENTIAL/PLATELET
Abs Immature Granulocytes: 0.04 10*3/uL (ref 0.00–0.07)
Basophils Absolute: 0.1 10*3/uL (ref 0.0–0.1)
Basophils Relative: 1 %
Eosinophils Absolute: 0.2 10*3/uL (ref 0.0–0.5)
Eosinophils Relative: 2 %
HCT: 48 % (ref 39.0–52.0)
Hemoglobin: 16.5 g/dL (ref 13.0–17.0)
Immature Granulocytes: 0 %
Lymphocytes Relative: 24 %
Lymphs Abs: 2.4 10*3/uL (ref 0.7–4.0)
MCH: 32.3 pg (ref 26.0–34.0)
MCHC: 34.4 g/dL (ref 30.0–36.0)
MCV: 93.9 fL (ref 80.0–100.0)
Monocytes Absolute: 0.9 10*3/uL (ref 0.1–1.0)
Monocytes Relative: 9 %
Neutro Abs: 6.3 10*3/uL (ref 1.7–7.7)
Neutrophils Relative %: 64 %
Platelets: 275 10*3/uL (ref 150–400)
RBC: 5.11 MIL/uL (ref 4.22–5.81)
RDW: 11.9 % (ref 11.5–15.5)
WBC: 10.1 10*3/uL (ref 4.0–10.5)
nRBC: 0 % (ref 0.0–0.2)

## 2019-08-01 LAB — URIC ACID: Uric Acid, Serum: 9.1 mg/dL — ABNORMAL HIGH (ref 3.7–8.6)

## 2019-08-01 MED ORDER — PREDNISONE 20 MG PO TABS
60.0000 mg | ORAL_TABLET | Freq: Once | ORAL | Status: AC
  Administered 2019-08-01: 60 mg via ORAL
  Filled 2019-08-01: qty 3

## 2019-08-01 MED ORDER — COLCHICINE 0.6 MG PO TABS: 0.6000 mg | ORAL_TABLET | Freq: Two times a day (BID) | ORAL | 2 refills | Status: DC

## 2019-08-01 NOTE — ED Provider Notes (Signed)
Emergency Department Provider Note  ____________________________________________  Time seen: Approximately 4:23 PM  I have reviewed the triage vital signs and the nursing notes.   HISTORY  Chief Complaint Toe Pain   Historian Patient     HPI Kirk Lyons is a 49 y.o. male presents to the emergency department with right great toe pain for the past 2 days.  Patient has noticed redness and swelling of the right great toe.  He denies any history of gout.  States that his diet is primarily vegetarian and he drinks and smokes alcohol socially.  Denies experiencing similar symptoms in the past.  Denies taking hydrochlorothiazide or other antihypertensive medications.  Denies fever or chills at home.  No other alleviating measures have been attempted.   No past medical history on file.   Immunizations up to date:  Yes.     No past medical history on file.  There are no problems to display for this patient.   Past Surgical History:  Procedure Laterality Date  . HERNIA REPAIR      Prior to Admission medications   Medication Sig Start Date End Date Taking? Authorizing Provider  albuterol (VENTOLIN HFA) 108 (90 Base) MCG/ACT inhaler Inhale 2 puffs into the lungs every 4 (four) hours as needed for wheezing or shortness of breath. 06/12/19   Paulette Blanch, MD  amoxicillin (AMOXIL) 500 MG capsule Take 1 capsule (500 mg total) by mouth 3 (three) times daily. 07/28/19   Paulette Blanch, MD  amoxicillin-clavulanate (AUGMENTIN) 875-125 MG tablet Take 1 tablet by mouth 2 (two) times daily. 06/12/19   Paulette Blanch, MD  cephALEXin (KEFLEX) 500 MG capsule Take 1 capsule (500 mg total) by mouth 3 (three) times daily. 05/10/19   Menshew, Dannielle Karvonen, PA-C  chlorpheniramine-HYDROcodone (TUSSIONEX PENNKINETIC ER) 10-8 MG/5ML SUER Take 5 mLs by mouth 2 (two) times daily. 07/28/19   Paulette Blanch, MD  colchicine 0.6 MG tablet Take 1 tablet (0.6 mg total) by mouth 2 (two) times daily for 7 days.  08/01/19 08/08/19  Lannie Fields, PA-C  feeding supplement (ENSURE CLINICAL STRENGTH) LIQD Take 237 mLs by mouth daily as needed (nutrition).    [provider]  methylPREDNISolone (MEDROL DOSEPAK) 4 MG TBPK tablet Take as directed 07/28/19   Paulette Blanch, MD  Multiple Vitamins-Minerals (EMERGEN-C VITAMIN C) PACK Take 1 Package by mouth as needed (immune system support).    [provider]    Allergies Erythromycin  No family history on file.  Social History Social History   Tobacco Use  . Smoking status: Former Research scientist (life sciences)  . Smokeless tobacco: Never Used  Substance Use Topics  . Alcohol use: Yes    Comment: social  . Drug use: No     Review of Systems  Constitutional: No fever/chills Eyes:  No discharge ENT: No upper respiratory complaints. Respiratory: no cough. No SOB/ use of accessory muscles to breath Gastrointestinal:   No nausea, no vomiting.  No diarrhea.  No constipation. Musculoskeletal: Patient has right great toe pain.  Skin: Negative for rash, abrasions, lacerations, ecchymosis.   ____________________________________________   PHYSICAL EXAM:  VITAL SIGNS: ED Triage Vitals  Enc Vitals Group     BP 08/01/19 1526 (!) 137/104     Pulse Rate 08/01/19 1526 (!) 107     Resp 08/01/19 1526 16     Temp 08/01/19 1526 98.2 F (36.8 C)     Temp Source 08/01/19 1526 Oral     SpO2 08/01/19  1526 96 %     Weight --      Height --      Head Circumference --      Peak Flow --      Pain Score 08/01/19 1527 7     Pain Loc --      Pain Edu? --      Excl. in GC? --      Constitutional: Alert and oriented. Well appearing and in no acute distress. Eyes: Conjunctivae are normal. PERRL. EOMI. Head: Atraumatic. ENT: Cardiovascular: Normal rate, regular rhythm. Normal S1 and S2.  Good peripheral circulation. Respiratory: Normal respiratory effort without tachypnea or retractions. Lungs CTAB. Good air entry to the bases with no decreased or absent breath  sounds Gastrointestinal: Bowel sounds x 4 quadrants. Soft and nontender to palpation. No guarding or rigidity. No distention. Musculoskeletal: Full range of motion to all extremities. No obvious deformities noted Neurologic:  Normal for age. No gross focal neurologic deficits are appreciated.  Skin: Patient has erythema and edema of right great toe.  Psychiatric: Mood and affect are normal for age. Speech and behavior are normal.   ____________________________________________   LABS (all labs ordered are listed, but only abnormal results are displayed)  Labs Reviewed  URIC ACID - Abnormal; Notable for the following components:      Result Value   Uric Acid, Serum 9.1 (*)    All other components within normal limits  CBC WITH DIFFERENTIAL/PLATELET   ____________________________________________  EKG   ____________________________________________  RADIOLOGY   No results found.  ____________________________________________    PROCEDURES  Procedure(s) performed:     Procedures     Medications  predniSONE (DELTASONE) tablet 60 mg (60 mg Oral Given 08/01/19 1728)     ____________________________________________   INITIAL IMPRESSION / ASSESSMENT AND PLAN / ED COURSE  Pertinent labs & imaging results that were available during my care of the patient were reviewed by me and considered in my medical decision making (see chart for details).      Assessment and Plan: Right great toe pain.  49 year old male presents to the emergency department with right great toe pain for the past 2 days as well as erythema and edema.  Patient was mildly tachycardic at triage but vital signs were otherwise reassuring.  Differential diagnosis includes gout versus cellulitis.  We will obtain uric acid level and CBC and will reassess.  No leukocytosis on CBC.  Uric acid level was elevated.  Clinical and laboratory findings suggest gout.  Patient was discharged with colchicine.   Return precautions were given to return with new or worsening symptoms.  A work note was provided.  ____________________________________________  FINAL CLINICAL IMPRESSION(S) / ED DIAGNOSES  Final diagnoses:  Acute idiopathic gout involving toe of right foot      NEW MEDICATIONS STARTED DURING THIS VISIT:  ED Discharge Orders         Ordered    colchicine 0.6 MG tablet  2 times daily     08/01/19 1720              This chart was dictated using voice recognition software/Kirk. Despite best efforts to proofread, errors can occur which can change the meaning. Any change was purely unintentional.     Orvil Feil, PA-C 08/01/19 1739    Concha Se, MD 08/01/19 Paulo Fruit

## 2019-08-01 NOTE — ED Notes (Signed)
Pt states right toe pain

## 2019-08-01 NOTE — ED Triage Notes (Signed)
Pt reports that he woke up 2 days ago with great right toe pain, pt states that it hurts to even have the bed sheet touch it, denies hx of gout, denies hx of injury

## 2019-09-10 ENCOUNTER — Other Ambulatory Visit: Payer: Self-pay

## 2019-09-10 ENCOUNTER — Emergency Department
Admission: EM | Admit: 2019-09-10 | Discharge: 2019-09-10 | Disposition: A | Discharge status: Home / Self Care | Payer: 59 | Attending: Emergency Medicine | Admitting: Emergency Medicine

## 2019-09-10 DIAGNOSIS — M109 Gout, unspecified: Secondary | ICD-10-CM | POA: Diagnosis not present

## 2019-09-10 DIAGNOSIS — I10 Essential (primary) hypertension: Secondary | ICD-10-CM | POA: Diagnosis not present

## 2019-09-10 DIAGNOSIS — Z87891 Personal history of nicotine dependence: Secondary | ICD-10-CM | POA: Insufficient documentation

## 2019-09-10 DIAGNOSIS — M79671 Pain in right foot: Secondary | ICD-10-CM | POA: Diagnosis present

## 2019-09-10 DIAGNOSIS — Z79899 Other long term (current) drug therapy: Secondary | ICD-10-CM | POA: Diagnosis not present

## 2019-09-10 HISTORY — DX: Gout, unspecified: M10.9

## 2019-09-10 HISTORY — DX: Essential (primary) hypertension: I10

## 2019-09-10 MED ORDER — COLCHICINE 0.6 MG PO TABS
1.8000 mg | ORAL_TABLET | Freq: Once | ORAL | Status: AC
  Administered 2019-09-10: 1.8 mg via ORAL
  Filled 2019-09-10: qty 3

## 2019-09-10 MED ORDER — COLCHICINE 0.6 MG PO TABS: 0.6000 mg | ORAL_TABLET | Freq: Every day | ORAL | 1 refills | Status: DC

## 2019-09-10 MED ORDER — PREDNISONE 50 MG PO TABS: 50.0000 mg | ORAL_TABLET | Freq: Every day | ORAL | 0 refills | Status: DC

## 2019-09-10 NOTE — ED Provider Notes (Signed)
[facility] Emergency Department Provider Note  ____________________________________________  Time seen: Approximately 7:57 PM  I have reviewed the triage vital signs and the nursing notes.   HISTORY  Chief Complaint Foot Pain    HPI Kirk Lyons is a 49 y.o. male who presents the emergency department complaining of gout to the great toe of the right foot.  Patient has a history of gout, states that he is out of the colchicine.  Patient states that he was attempting to manage it at home with Tylenol Motrin, however the pain persisted.  Patient states that given the amount of pain to the great toe he has been able to walk or work.  He is also requesting a work note.  Patient states that in the past he has taken colchicine which typically rapidly resolves his symptoms.  Patient denies any other complaint at this time.         Past Medical History:  Diagnosis Date  . Gout   . Hypertension     There are no problems to display for this patient.   Past Surgical History:  Procedure Laterality Date  . HERNIA REPAIR      Prior to Admission medications   Medication Sig Start Date End Date Taking? Authorizing Provider  albuterol (VENTOLIN HFA) 108 (90 Base) MCG/ACT inhaler Inhale 2 puffs into the lungs every 4 (four) hours as needed for wheezing or shortness of breath. 06/12/19   Irean Hong, MD  amoxicillin (AMOXIL) 500 MG capsule Take 1 capsule (500 mg total) by mouth 3 (three) times daily. 07/28/19   Irean Hong, MD  amoxicillin-clavulanate (AUGMENTIN) 875-125 MG tablet Take 1 tablet by mouth 2 (two) times daily. 06/12/19   Irean Hong, MD  cephALEXin (KEFLEX) 500 MG capsule Take 1 capsule (500 mg total) by mouth 3 (three) times daily. 05/10/19   Menshew, Charlesetta Ivory, PA-C  chlorpheniramine-HYDROcodone (TUSSIONEX PENNKINETIC ER) 10-8 MG/5ML SUER Take 5 mLs by mouth 2 (two) times daily. 07/28/19   Irean Hong, MD  colchicine 0.6 MG tablet Take 1 tablet  (0.6 mg total) by mouth daily. Take 1 tab daily for at least 6 more days. If  Symptoms persist past 6 days continue to use until prescription is finished 09/10/19   Atalia Litzinger, Christiane Ha D, PA-C  feeding supplement (ENSURE CLINICAL STRENGTH) LIQD Take 237 mLs by mouth daily as needed (nutrition).    [provider]  methylPREDNISolone (MEDROL DOSEPAK) 4 MG TBPK tablet Take as directed 07/28/19   Irean Hong, MD  Multiple Vitamins-Minerals (EMERGEN-C VITAMIN C) PACK Take 1 Package by mouth as needed (immune system support).    [provider]  predniSONE (DELTASONE) 50 MG tablet Take 1 tablet (50 mg total) by mouth daily with breakfast. 09/10/19   Ahlia Lemanski, Delorise Royals, PA-C    Allergies Erythromycin  No family history on file.  Social History Social History   Tobacco Use  . Smoking status: Former Games developer  . Smokeless tobacco: Never Used  Substance Use Topics  . Alcohol use: Yes    Comment: social  . Drug use: No     Review of Systems  Constitutional: No fever/chills Eyes: No visual changes. No discharge ENT: No upper respiratory complaints. Cardiovascular: no chest pain. Respiratory: no cough. No SOB. Gastrointestinal: No abdominal pain.  No nausea, no vomiting.  No diarrhea.  No constipation. Musculoskeletal: Pain, swelling, erythema to the MTP joint of the great toe right foot, gout flare Skin: Negative for  rash, abrasions, lacerations, ecchymosis. Neurological: Negative for headaches, focal weakness or numbness. 10-point ROS otherwise negative.  ____________________________________________   PHYSICAL EXAM:  VITAL SIGNS: ED Triage Vitals  Enc Vitals Group     BP 09/10/19 1826 (!) 147/97     Pulse Rate 09/10/19 1826 (!) 114     Resp 09/10/19 1826 20     Temp 09/10/19 1826 98.3 F (36.8 C)     Temp Source 09/10/19 1826 Oral     SpO2 09/10/19 1826 95 %     Weight 09/10/19 1827 247 lb (112 kg)     Height 09/10/19 1827 6\' 2"  (1.88 m)     Head  Circumference --      Peak Flow --      Pain Score 09/10/19 1849 5     Pain Loc --      Pain Edu? --      Excl. in [place]? --      Constitutional: Alert and oriented. Well appearing and in no acute distress. Eyes: Conjunctivae are normal. PERRL. EOMI. Head: Atraumatic. ENT:      Ears:       Nose: No congestion/rhinnorhea.      Mouth/Throat: Mucous membranes are moist.  Neck: No stridor.    Cardiovascular: Normal rate, regular rhythm. Normal S1 and S2.  Good peripheral circulation. Respiratory: Normal respiratory effort without tachypnea or retractions. Lungs CTAB. Good air entry to the bases with no decreased or absent breath sounds. Musculoskeletal: Full range of motion to all extremities. No gross deformities appreciated.  Visualization of the right foot reveals erythema and edema about the MTP joint of the great toe right foot.  No overlying skin changes concerning for cellulitis.  Area is tender to palpation.  No palpable abnormalities.  Sensation intact all digits.  Capillary refill less than 2 seconds all digits. Neurologic:  Normal speech and language. No gross focal neurologic deficits are appreciated.  Skin:  Skin is warm, dry and intact. No rash noted. Psychiatric: Mood and affect are normal. Speech and behavior are normal. Patient exhibits appropriate insight and judgement.   ____________________________________________   LABS (all labs ordered are listed, but only abnormal results are displayed)  Labs Reviewed - No data to display ____________________________________________  EKG   ____________________________________________  RADIOLOGY   No results found.  ____________________________________________    PROCEDURES  Procedure(s) performed:    Procedures    Medications  colchicine tablet 1.8 mg (has no administration in time range)     ____________________________________________   INITIAL IMPRESSION / ASSESSMENT AND PLAN / ED COURSE  Pertinent  labs & imaging results that were available during my care of the patient were reviewed by me and considered in my medical decision making (see chart for details).  Review of the [place] CSRS was performed in accordance of the [place] prior to dispensing any controlled drugs.           Patient's diagnosis is consistent with gout of the great toe right foot.  Patient presented to emergency department complaining of scalp pain to the great toe.  Physical exam is consistent with same.  Differential includes cellulitis, septic joint, contusion or fracture.  Patient is out of his colchicine.  He states that this typically resolves his symptoms.  Patient is given first dosing of colchicine here in the emergency department, I will prescribe colchicine for at home use as well.  Patient will also be provided short course of steroids.  Follow-up primary care as needed.. Patient is  given ED precautions to return to the ED for any worsening or new symptoms.     ____________________________________________  FINAL CLINICAL IMPRESSION(S) / ED DIAGNOSES  Final diagnoses:  Acute gout involving toe of right foot, unspecified cause      NEW MEDICATIONS STARTED DURING THIS VISIT:  ED Discharge Orders         Ordered    colchicine 0.6 MG tablet  Daily     09/10/19 1959    predniSONE (DELTASONE) 50 MG tablet  Daily with breakfast     09/10/19 1959              This chart was dictated using voice recognition software/Dragon. Despite best efforts to proofread, errors can occur which can change the meaning. Any change was purely unintentional.    Lanette Hampshire 09/10/19 Nicholes Mango, MD 09/10/19 2356

## 2019-09-10 NOTE — ED Triage Notes (Signed)
Pt to the er for pain to the right big toe. Pt has a hx of gout and used to take colchicine but RX ran out. Pt states he has had to take prednisone in the past. Pt has swelling to the medial side of the left great toe.

## 2019-09-17 ENCOUNTER — Encounter: Payer: Self-pay | Admitting: Emergency Medicine

## 2019-09-17 ENCOUNTER — Emergency Department
Admission: EM | Admit: 2019-09-17 | Discharge: 2019-09-17 | Disposition: A | Discharge status: Home / Self Care | Payer: 59 | Attending: Emergency Medicine | Admitting: Emergency Medicine

## 2019-09-17 ENCOUNTER — Other Ambulatory Visit: Payer: Self-pay

## 2019-09-17 DIAGNOSIS — Z79899 Other long term (current) drug therapy: Secondary | ICD-10-CM | POA: Diagnosis not present

## 2019-09-17 DIAGNOSIS — I10 Essential (primary) hypertension: Secondary | ICD-10-CM | POA: Diagnosis not present

## 2019-09-17 DIAGNOSIS — R21 Rash and other nonspecific skin eruption: Secondary | ICD-10-CM

## 2019-09-17 DIAGNOSIS — Z87891 Personal history of nicotine dependence: Secondary | ICD-10-CM | POA: Diagnosis not present

## 2019-09-17 MED ORDER — AMOXICILLIN 500 MG PO CAPS
500.0000 mg | ORAL_CAPSULE | Freq: Once | ORAL | Status: AC
  Administered 2019-09-17: 21:00:00 500 mg via ORAL
  Filled 2019-09-17: qty 1

## 2019-09-17 MED ORDER — DEXAMETHASONE SODIUM PHOSPHATE 10 MG/ML IJ SOLN
10.0000 mg | Freq: Once | INTRAMUSCULAR | Status: AC
  Administered 2019-09-17: 10 mg via INTRAMUSCULAR
  Filled 2019-09-17: qty 1

## 2019-09-17 MED ORDER — AMOXICILLIN 500 MG PO TABS: 500.0000 mg | ORAL_TABLET | Freq: Three times a day (TID) | ORAL | 0 refills | Status: DC

## 2019-09-17 NOTE — ED Provider Notes (Signed)
[facility] Emergency Department Provider Note  ____________________________________________  Time seen: Approximately 8:16 PM  I have reviewed the triage vital signs and the nursing notes.   HISTORY  Chief Complaint Rash   HPI Kirk Lyons is a 49 y.o. male who presents to the emergency department for treatment and evaluation of rash that started approximately 5 days ago.  The rash is very pruritic.  It started on his chest and abdomen and has spread to his neck, back, and upper extremities.  He was recently treated for gout with colchicine.  Otherwise, no new medications or exposure to new hygiene products.  No one else in the house has similar symptoms.  Past Medical History:  Diagnosis Date  . Gout   . Hypertension     There are no problems to display for this patient.   Past Surgical History:  Procedure Laterality Date  . HERNIA REPAIR      Prior to Admission medications   Medication Sig Start Date End Date Taking? Authorizing Provider  albuterol (VENTOLIN HFA) 108 (90 Base) MCG/ACT inhaler Inhale 2 puffs into the lungs every 4 (four) hours as needed for wheezing or shortness of breath. 06/12/19   Paulette Blanch, MD  amoxicillin (AMOXIL) 500 MG tablet Take 1 tablet (500 mg total) by mouth 3 (three) times daily. 09/17/19   Shardae Kleinman, Johnette Abraham B, FNP  amoxicillin-clavulanate (AUGMENTIN) 875-125 MG tablet Take 1 tablet by mouth 2 (two) times daily. 06/12/19   Paulette Blanch, MD  cephALEXin (KEFLEX) 500 MG capsule Take 1 capsule (500 mg total) by mouth 3 (three) times daily. 05/10/19   Menshew, Dannielle Karvonen, PA-C  chlorpheniramine-HYDROcodone (TUSSIONEX PENNKINETIC ER) 10-8 MG/5ML SUER Take 5 mLs by mouth 2 (two) times daily. 07/28/19   Paulette Blanch, MD  colchicine 0.6 MG tablet Take 1 tablet (0.6 mg total) by mouth daily. Take 1 tab daily for at least 6 more days. If  Symptoms persist past 6 days continue to use until prescription is finished 09/10/19    Cuthriell, Roderic Palau D, PA-C  feeding supplement (ENSURE CLINICAL STRENGTH) LIQD Take 237 mLs by mouth daily as needed (nutrition).    [provider]  methylPREDNISolone (MEDROL DOSEPAK) 4 MG TBPK tablet Take as directed 07/28/19   Paulette Blanch, MD  Multiple Vitamins-Minerals (EMERGEN-C VITAMIN C) PACK Take 1 Package by mouth as needed (immune system support).    [provider]  predniSONE (DELTASONE) 50 MG tablet Take 1 tablet (50 mg total) by mouth daily with breakfast. 09/10/19   Cuthriell, Charline Bills, PA-C    Allergies Erythromycin  No family history on file.  Social History Social History   Tobacco Use  . Smoking status: Former Research scientist (life sciences)  . Smokeless tobacco: Never Used  Substance Use Topics  . Alcohol use: Yes    Comment: social  . Drug use: No    Review of Systems  Constitutional: Negative for fever. Respiratory: Negative for cough or shortness of breath.  Musculoskeletal: Negative for myalgias Skin: Positive for diffuse upper body rash Neurological: Negative for numbness or paresthesias. ____________________________________________   PHYSICAL EXAM:  VITAL SIGNS: ED Triage Vitals  Enc Vitals Group     BP 09/17/19 2007 (!) 142/100     Pulse Rate 09/17/19 2007 (!) 114     Resp 09/17/19 2007 (!) 29     Temp 09/17/19 2007 98 F (36.7 C)     Temp Source 09/17/19 2007 Oral     SpO2 09/17/19 2007  97 %     Weight 09/17/19 2005 270 lb (122.5 kg)     Height 09/17/19 2005 6\' 2"  (1.88 m)     Head Circumference --      Peak Flow --      Pain Score 09/17/19 2005 0     Pain Loc --      Pain Edu? --      Excl. in GC? --      Constitutional: Overall well appearing. Eyes: Conjunctivae are clear without discharge or drainage. Nose: No rhinorrhea noted. Mouth/Throat: Airway is patent.  Posterior oropharynx appears erythematous. Neck: No stridor. Unrestricted range of motion observed. Cardiovascular: Capillary refill is <3 seconds.  Respiratory:  Respirations are even and unlabored.. Musculoskeletal: Unrestricted range of motion observed. Neurologic: Awake, alert, and oriented x 4.  Skin: Sandpaperlike, erythematous maculopapular rash noted over trunk and upper extremities.  ____________________________________________   LABS (all labs ordered are listed, but only abnormal results are displayed)  Labs Reviewed - No data to display ____________________________________________  EKG  Not indicated. ____________________________________________  RADIOLOGY  Not indicated ____________________________________________   PROCEDURES  Procedures ____________________________________________   INITIAL IMPRESSION / ASSESSMENT AND PLAN / ED COURSE  HERSH MINNEY is a 49 y.o. male presenting to the emergency department for treatment and evaluation of rash as described above.  Plan will be to give Decadron injection while here and to cover for potential bacterial source due to the appearance of a scarlatiniform rash.  He will also be advised to take Benadryl to help with the itching.  He is to follow-up with primary care provider for symptoms that are not improving over the next couple days.  Differential diagnosis also included medication induced ITP, however rash does not appear as petechiae pityriasis rosea, viral exanthem   Medications  dexamethasone (DECADRON) injection 10 mg (10 mg Intramuscular Given 09/17/19 2038)  amoxicillin (AMOXIL) capsule 500 mg (500 mg Oral Given 09/17/19 2038)     Pertinent labs & imaging results that were available during my care of the patient were reviewed by me and considered in my medical decision making (see chart for details).  ____________________________________________   FINAL CLINICAL IMPRESSION(S) / ED DIAGNOSES  Final diagnoses:  Rash and nonspecific skin eruption    ED Discharge Orders         Ordered    amoxicillin (AMOXIL) 500 MG tablet  3 times daily     09/17/19 2032            Note:  This document was prepared using Dragon voice recognition software and may include unintentional dictation errors.   2033, FNP 09/17/19 2117    2118, MD 09/17/19 [phone]

## 2019-09-17 NOTE — ED Notes (Signed)
Pt to the er for pinpoint red rash over his arms bilaterally, chest and back. Pt recently placed on colchicine for gout. Pt has stopped taking the gout.

## 2019-09-17 NOTE — Discharge Instructions (Signed)
Please take benadryl for itching. Apply Aquaphor or similar ointment to the rash Pick up the prescription for amoxicillin and take it until finished. Return to the ER for symptoms that change or worsen if unable to schedule an appointment.

## 2019-09-17 NOTE — ED Triage Notes (Signed)
Patient ambulatory to triage with steady gait, without difficulty or distress noted; pt reports itchy generalize rash with no known cause since last Friday; using topical benadryl without relief

## 2019-10-05 ENCOUNTER — Emergency Department
Admission: EM | Admit: 2019-10-05 | Discharge: 2019-10-05 | Disposition: A | Discharge status: Home / Self Care | Payer: 59 | Attending: Emergency Medicine | Admitting: Emergency Medicine

## 2019-10-05 ENCOUNTER — Emergency Department: Payer: 59

## 2019-10-05 ENCOUNTER — Encounter: Payer: Self-pay | Admitting: Emergency Medicine

## 2019-10-05 ENCOUNTER — Other Ambulatory Visit: Payer: Self-pay

## 2019-10-05 DIAGNOSIS — Z87891 Personal history of nicotine dependence: Secondary | ICD-10-CM | POA: Insufficient documentation

## 2019-10-05 DIAGNOSIS — Z79899 Other long term (current) drug therapy: Secondary | ICD-10-CM | POA: Insufficient documentation

## 2019-10-05 DIAGNOSIS — I10 Essential (primary) hypertension: Secondary | ICD-10-CM | POA: Insufficient documentation

## 2019-10-05 DIAGNOSIS — M79674 Pain in right toe(s): Secondary | ICD-10-CM | POA: Diagnosis present

## 2019-10-05 DIAGNOSIS — R21 Rash and other nonspecific skin eruption: Secondary | ICD-10-CM | POA: Insufficient documentation

## 2019-10-05 DIAGNOSIS — M109 Gout, unspecified: Secondary | ICD-10-CM | POA: Diagnosis not present

## 2019-10-05 LAB — COMPREHENSIVE METABOLIC PANEL
ALT: 32 U/L (ref 0–44)
AST: 22 U/L (ref 15–41)
Albumin: 4.3 g/dL (ref 3.5–5.0)
Alkaline Phosphatase: 62 U/L (ref 38–126)
Anion gap: 9 (ref 5–15)
BUN: 16 mg/dL (ref 6–20)
CO2: 26 mmol/L (ref 22–32)
Calcium: 9.6 mg/dL (ref 8.9–10.3)
Chloride: 103 mmol/L (ref 98–111)
Creatinine, Ser: 1.04 mg/dL (ref 0.61–1.24)
GFR calc Af Amer: >60 mL/min (ref >60)
GFR calc non Af Amer: >60 mL/min (ref >60)
Glucose, Bld: 131 mg/dL — ABNORMAL HIGH (ref 70–99)
Potassium: 4.1 mmol/L (ref 3.5–5.1)
Sodium: 138 mmol/L (ref 135–145)
Total Bilirubin: 0.8 mg/dL (ref 0.3–1.2)
Total Protein: 7.5 g/dL (ref 6.5–8.1)

## 2019-10-05 LAB — CBC WITH DIFFERENTIAL/PLATELET
Abs Immature Granulocytes: 0.04 10*3/uL (ref 0.00–0.07)
Basophils Absolute: 0.1 10*3/uL (ref 0.0–0.1)
Basophils Relative: 1 %
Eosinophils Absolute: 0.3 10*3/uL (ref 0.0–0.5)
Eosinophils Relative: 2 %
HCT: 45.7 % (ref 39.0–52.0)
Hemoglobin: 15.2 g/dL (ref 13.0–17.0)
Immature Granulocytes: 0 %
Lymphocytes Relative: 21 %
Lymphs Abs: 2.7 10*3/uL (ref 0.7–4.0)
MCH: 31.9 pg (ref 26.0–34.0)
MCHC: 33.3 g/dL (ref 30.0–36.0)
MCV: 95.8 fL (ref 80.0–100.0)
Monocytes Absolute: 1.3 10*3/uL — ABNORMAL HIGH (ref 0.1–1.0)
Monocytes Relative: 10 %
Neutro Abs: 8.3 10*3/uL — ABNORMAL HIGH (ref 1.7–7.7)
Neutrophils Relative %: 66 %
Platelets: 288 10*3/uL (ref 150–400)
RBC: 4.77 MIL/uL (ref 4.22–5.81)
RDW: 12 % (ref 11.5–15.5)
WBC: 12.6 10*3/uL — ABNORMAL HIGH (ref 4.0–10.5)
nRBC: 0 % (ref 0.0–0.2)

## 2019-10-05 LAB — URIC ACID: Uric Acid, Serum: 8.8 mg/dL — ABNORMAL HIGH (ref 3.7–8.6)

## 2019-10-05 MED ORDER — OXYCODONE-ACETAMINOPHEN 7.5-325 MG PO TABS
1.0000 | ORAL_TABLET | Freq: Four times a day (QID) | ORAL | Status: DC | PRN
  Administered 2019-10-05: 1 via ORAL
  Filled 2019-10-05: qty 1

## 2019-10-05 MED ORDER — OXYCODONE-ACETAMINOPHEN 5-325 MG PO TABS: 1.0000 | ORAL_TABLET | Freq: Four times a day (QID) | ORAL | 0 refills | Status: DC | PRN

## 2019-10-05 MED ORDER — PREDNISONE 10 MG PO TABS: ORAL_TABLET | ORAL | 0 refills | Status: DC

## 2019-10-05 MED ORDER — TRIAMCINOLONE ACETONIDE 0.5 % EX OINT: 1.0000 "application " | TOPICAL_OINTMENT | Freq: Two times a day (BID) | CUTANEOUS | 1 refills | Status: DC

## 2019-10-05 MED ORDER — KETOROLAC TROMETHAMINE 30 MG/ML IJ SOLN
30.0000 mg | Freq: Once | INTRAMUSCULAR | Status: AC
  Administered 2019-10-05: 30 mg via INTRAVENOUS
  Filled 2019-10-05: qty 1

## 2019-10-05 NOTE — ED Notes (Signed)
See triage note  Presents with pain and swelling to right great toe and lateral foot  Denies any injury  But state has a hx of gout  Pain started late last pm

## 2019-10-05 NOTE — ED Provider Notes (Signed)
[facility] Emergency Department Provider Note  ____________________________________________   None    (approximate)  I have reviewed the triage vital signs and the nursing notes.   HISTORY  Chief Complaint Foot Pain and Rash   HPI Kirk Lyons is a 49 y.o. male presents to the ED with complaint of swelling and pain of his right great toe that started suddenly last evening.  He denies any recent injury.  He states that he has had horses stepped on his foot in the past but has never had it x-rayed.  He reports that he was told that this was gout.  He states that the last time he was in the ED he was given an injection and states he has had a rash on his arms since that time.  He denies any difficulty breathing with the rash and states that on his abdomen the rash has decreased somewhat.  Currently rates his pain as 9/10.       Past Medical History:  Diagnosis Date  . Gout   . Hypertension     There are no problems to display for this patient.   Past Surgical History:  Procedure Laterality Date  . HERNIA REPAIR      Prior to Admission medications   Medication Sig Start Date End Date Taking? Authorizing Provider  albuterol (VENTOLIN HFA) 108 (90 Base) MCG/ACT inhaler Inhale 2 puffs into the lungs every 4 (four) hours as needed for wheezing or shortness of breath. 06/12/19   Irean Hong, MD  feeding supplement (ENSURE CLINICAL STRENGTH) LIQD Take 237 mLs by mouth daily as needed (nutrition).    [provider]  Multiple Vitamins-Minerals (EMERGEN-C VITAMIN C) PACK Take 1 Package by mouth as needed (immune system support).    [provider]  oxyCODONE-acetaminophen (PERCOCET) 5-325 MG tablet Take 1 tablet by mouth every 6 (six) hours as needed for severe pain. 10/05/19 10/04/20  Tommi Rumps, PA-C  predniSONE (DELTASONE) 10 MG tablet Take 6 tablets  today, on day 2 take 5 tablets, day 3 take 4 tablets, day 4 take 3 tablets, day 5  take  2 tablets and 1 tablet the last day 10/05/19   Tommi Rumps, PA-C  triamcinolone ointment (KENALOG) 0.5 % Apply 1 application topically 2 (two) times daily. 10/05/19   Tommi Rumps, PA-C    Allergies Erythromycin  No family history on file.  Social History Social History   Tobacco Use  . Smoking status: Former Games developer  . Smokeless tobacco: Never Used  Substance Use Topics  . Alcohol use: Yes    Comment: social  . Drug use: No    Review of Systems Constitutional: No fever/chills Cardiovascular: Denies chest pain. Respiratory: Denies shortness of breath. Gastrointestinal: No abdominal pain.  No nausea, no vomiting. Genitourinary: Negative for dysuria. Musculoskeletal: Positive for right great toe pain. Skin: Positive for erythema right foot. Neurological: Negative for headaches, focal weakness or numbness. ____________________________________________   PHYSICAL EXAM:  VITAL SIGNS: ED Triage Vitals  Enc Vitals Group     BP 10/05/19 1017 (!) 161/118     Pulse Rate 10/05/19 1017 (!) 115     Resp 10/05/19 1017 16     Temp 10/05/19 1017 97.6 F (36.4 C)     Temp Source 10/05/19 1017 Oral     SpO2 10/05/19 1017 94 %     Weight 10/05/19 1018 270 lb (122.5 kg)     Height 10/05/19 1018 6\' 2"  (1.88  m)     Head Circumference --      Peak Flow --      Pain Score 10/05/19 1017 9     Pain Loc --      Pain Edu? --      Excl. in [place]? --     Constitutional: Alert and oriented. Well appearing and in no acute distress. Eyes: Conjunctivae are normal.  Head: Atraumatic. Nose: No congestion/rhinnorhea. Neck: No stridor.   Cardiovascular: Normal rate, regular rhythm. Grossly normal heart sounds.  Good peripheral circulation. Respiratory: Normal respiratory effort.  No retractions. Lungs CTAB. Gastrointestinal: Soft and nontender. No distention. Musculoskeletal: Right foot there is moderate tenderness, erythema and edema noted at the MP joint of the first digit.   Patient is unable to tolerate touching in this area or the sheet to touch his foot.  Pulses present.  Skin is intact.  Warm to touch.  No drainage is present. Neurologic:  Normal speech and language. No gross focal neurologic deficits are appreciated.  Skin:  Skin is warm, dry and intact.  Patient also has a diffuse papular erythematous rash to his upper arms bilaterally with some involvement on his abdomen.  His back has completely cleared.  No vesicles, drainage, warmth is noted. Psychiatric: Mood and affect are normal. Speech and behavior are normal.  ____________________________________________   LABS (all labs ordered are listed, but only abnormal results are displayed)  Labs Reviewed  CBC WITH DIFFERENTIAL/PLATELET - Abnormal; Notable for the following components:      Result Value   WBC 12.6 (*)    Neutro Abs 8.3 (*)    Monocytes Absolute 1.3 (*)    All other components within normal limits  COMPREHENSIVE METABOLIC PANEL - Abnormal; Notable for the following components:   Glucose, Bld 131 (*)    All other components within normal limits  URIC ACID - Abnormal; Notable for the following components:   Uric Acid, Serum 8.8 (*)    All other components within normal limits     RADIOLOGY  Official radiology report(s): DG Foot Complete Right  Result Date: 10/05/2019 CLINICAL DATA:  Presents with pain and swelling to right great toe, 1st MTP joint and medial foot. Denies any injury. But state has a hx of gout. EXAM: RIGHT FOOT COMPLETE - 3+ VIEW COMPARISON:  Right foot radiographs 01/22/2008 FINDINGS: There is no evidence of fracture or dislocation. There are mild degenerative changes at the toe IP joint. No significant degenerative or erosive changes at the first MTP joint. There is soft tissue swelling about the first MCP joint. IMPRESSION: Soft tissue swelling at the first MTP joint without underlying osseous abnormality. Electronically Signed   By: Audie Pinto M.D.   On:  10/05/2019 12:37    ____________________________________________   PROCEDURES  Procedure(s) performed (including Critical Care):  Procedures   ____________________________________________   INITIAL IMPRESSION / ASSESSMENT AND PLAN / ED COURSE  As part of my medical decision making, I reviewed the following data within the electronic MEDICAL RECORD NUMBER Notes from prior ED visits and [place] Controlled Substance Database  49 year old male presents to the ED with complaint of sudden onset last evening of his right great toe becoming extremely painful and swelling.  Patient denies any recent injury.  He states he has a history of gout.  He also on his last visit was given some medicine that he believes caused the rash to his arms.  He states that it continues to itch without any improvement.  He  denies any difficulty breathing.  Patient also states that the colchicine that he was given a prescription for was over $100 and he is unable to afford this today.  Patient was given Percocet p.o. while in the ED along with IV Toradol.  Pain was controlled and patient was reassured that his lab work indicates that he does have uric acid elevation.  X-ray was negative for acute bony injury.  Patient is encouraged to follow-up with his PCP for further medication.  He was discharged on a prescription for Percocet every 6 hours as needed for pain, prednisone 6-day taper and Kenalog cream to apply to his arms for his rash.  ____________________________________________   FINAL CLINICAL IMPRESSION(S) / ED DIAGNOSES  Final diagnoses:  Acute gout involving toe of right foot, unspecified cause     ED Discharge Orders         Ordered    oxyCODONE-acetaminophen (PERCOCET) 5-325 MG tablet  Every 6 hours PRN     10/05/19 1315    predniSONE (DELTASONE) 10 MG tablet     10/05/19 1315    triamcinolone ointment (KENALOG) 0.5 %  2 times daily     10/05/19 1329           Note:  This document was prepared using  Dragon voice recognition software and may include unintentional dictation errors.    Tommi Rumps, PA-C 10/05/19 1524    Sharyn Creamer, MD 10/06/19 2138

## 2019-10-05 NOTE — ED Triage Notes (Signed)
Pt to ED via POV c/o right foot pain. Pt states that he has hx/o gout. Pt also having issue with rash on his arms and abdomen. Pt states that rash has been there for several months. Pt is in NAD

## 2019-10-05 NOTE — Discharge Instructions (Addendum)
Follow-up with your primary care provider or can no clinic acute care if any continued problems.  Your blood pressure was elevated in triage at 161/118 which may be from pain.  You should have your blood pressure rechecked by your primary care provider.  A prescription for prednisone for the next 6 days was sent to your pharmacy.  Take this medication as instructed starting today and tapering down by 1 tablet each day.  Also a prescription for pain medication was sent to the pharmacy.  This is 1 every 6 hours as needed for pain.  Also elevate your foot often which will reduce some of the swelling and pain.  A list of foods that are common to increase your chances for gout is also listed.  Avoid these foods of all possible.

## 2019-10-27 ENCOUNTER — Emergency Department
Admission: EM | Admit: 2019-10-27 | Discharge: 2019-10-27 | Disposition: A | Discharge status: Home / Self Care | Payer: 59 | Attending: Emergency Medicine | Admitting: Emergency Medicine

## 2019-10-27 ENCOUNTER — Encounter: Payer: Self-pay | Admitting: Emergency Medicine

## 2019-10-27 ENCOUNTER — Emergency Department: Payer: 59

## 2019-10-27 ENCOUNTER — Other Ambulatory Visit: Payer: Self-pay

## 2019-10-27 DIAGNOSIS — M79671 Pain in right foot: Secondary | ICD-10-CM | POA: Insufficient documentation

## 2019-10-27 DIAGNOSIS — R0789 Other chest pain: Secondary | ICD-10-CM | POA: Diagnosis not present

## 2019-10-27 DIAGNOSIS — Z79899 Other long term (current) drug therapy: Secondary | ICD-10-CM | POA: Insufficient documentation

## 2019-10-27 DIAGNOSIS — R05 Cough: Secondary | ICD-10-CM | POA: Insufficient documentation

## 2019-10-27 DIAGNOSIS — I10 Essential (primary) hypertension: Secondary | ICD-10-CM | POA: Insufficient documentation

## 2019-10-27 DIAGNOSIS — Z20822 Contact with and (suspected) exposure to covid-19: Secondary | ICD-10-CM | POA: Insufficient documentation

## 2019-10-27 DIAGNOSIS — M109 Gout, unspecified: Secondary | ICD-10-CM | POA: Insufficient documentation

## 2019-10-27 DIAGNOSIS — R059 Cough, unspecified: Secondary | ICD-10-CM

## 2019-10-27 LAB — TROPONIN I (HIGH SENSITIVITY)
Troponin I (High Sensitivity): 16 ng/L (ref <18)
Troponin I (High Sensitivity): 14 ng/L (ref <18)

## 2019-10-27 LAB — CBC
HCT: 44.7 % (ref 39.0–52.0)
Hemoglobin: 15.5 g/dL (ref 13.0–17.0)
MCH: 32.2 pg (ref 26.0–34.0)
MCHC: 34.7 g/dL (ref 30.0–36.0)
MCV: 92.7 fL (ref 80.0–100.0)
Platelets: 301 10*3/uL (ref 150–400)
RBC: 4.82 MIL/uL (ref 4.22–5.81)
RDW: 12.2 % (ref 11.5–15.5)
WBC: 10.2 10*3/uL (ref 4.0–10.5)
nRBC: 0 % (ref 0.0–0.2)

## 2019-10-27 LAB — BASIC METABOLIC PANEL
Anion gap: 10 (ref 5–15)
BUN: 11 mg/dL (ref 6–20)
CO2: 22 mmol/L (ref 22–32)
Calcium: 9 mg/dL (ref 8.9–10.3)
Chloride: 106 mmol/L (ref 98–111)
Creatinine, Ser: 0.87 mg/dL (ref 0.61–1.24)
GFR calc Af Amer: >60 mL/min (ref >60)
GFR calc non Af Amer: >60 mL/min (ref >60)
Glucose, Bld: 153 mg/dL — ABNORMAL HIGH (ref 70–99)
Potassium: 4.1 mmol/L (ref 3.5–5.1)
Sodium: 138 mmol/L (ref 135–145)

## 2019-10-27 MED ORDER — AMLODIPINE BESYLATE 5 MG PO TABS
10.0000 mg | ORAL_TABLET | Freq: Once | ORAL | Status: AC
  Administered 2019-10-27: 10 mg via ORAL
  Filled 2019-10-27: qty 2

## 2019-10-27 MED ORDER — PREDNISONE 20 MG PO TABS
40.0000 mg | ORAL_TABLET | Freq: Once | ORAL | Status: AC
  Administered 2019-10-27: 40 mg via ORAL
  Filled 2019-10-27: qty 2

## 2019-10-27 MED ORDER — IBUPROFEN 600 MG PO TABS
600.0000 mg | ORAL_TABLET | ORAL | Status: AC
  Administered 2019-10-27: 19:00:00 600 mg via ORAL
  Filled 2019-10-27: qty 1

## 2019-10-27 MED ORDER — PREDNISONE 20 MG PO TABS: 40.0000 mg | ORAL_TABLET | Freq: Every day | ORAL | 0 refills | Status: DC

## 2019-10-27 NOTE — ED Notes (Signed)
Pt states that he was seen for gout recently but that his foot is still hurting. Pt also complains of chest pressure and also has narcolepsy.

## 2019-10-27 NOTE — ED Triage Notes (Signed)
Pt reports CP for the last 3 days that is tight in nature and some SOB. Pt reports no fevers but has some phlem

## 2019-10-27 NOTE — ED Provider Notes (Signed)
[facility] Emergency Department Provider Note   ____________________________________________   First MD Initiated Contact with Patient 10/27/19 1808     (approximate)   I have reviewed the triage vital signs and the nursing notes.   HISTORY  Chief Complaint Chest Pain, Shortness of Breath, Foot Pain, and Gout    HPI A 49 year old patient with a history of hypertension and obesity presents for evaluation of chest pain. Initial onset of pain was more than 6 hours ago. The patient's chest pain is described as heaviness/pressure/tightness and is not worse with exertion. The patient's chest pain is not middle- or left-sided, is not well-localized, is not sharp and does not radiate to the arms/jaw/neck. The patient does not complain of nausea and denies diaphoresis. The patient has no history of stroke, has no history of peripheral artery disease, has not smoked in the past 90 days, denies any history of treated diabetes, has no relevant family history of coronary artery disease (first degree relative at less than age 73) and has no history of hypercholesterolemia.   For about 3 days now he has been experiencing a slight cough.  Associated with that he has been feeling slightly fatigued.  Sometimes having a slight sense of tightness in his chest as well  He also reports that he has gout flareup in his right great toe.  This is happened to him several times and usually responds to prednisone.  Denies any fevers or chills.  Still able to walk on it, but does feel sore and swollen where he has had gout before  Additionally, patient tells me that for 7 months now he has suffered from narcolepsy.  He will frequently be sleepy at work, and his job told him that he needed to go home and be evaluated because he would frequently be falling asleep on the job  He does report that he feels sleepy, but reports this is true of him for the last several months.  Fall asleep frequently  sometimes even while standing at work  Past Medical History:  Diagnosis Date  . Gout   . Hypertension     There are no problems to display for this patient.   Past Surgical History:  Procedure Laterality Date  . HERNIA REPAIR      Prior to Admission medications   Medication Sig Start Date End Date Taking? Authorizing Provider  albuterol (VENTOLIN HFA) 108 (90 Base) MCG/ACT inhaler Inhale 2 puffs into the lungs every 4 (four) hours as needed for wheezing or shortness of breath. 06/12/19   Irean Hong, MD  feeding supplement (ENSURE CLINICAL STRENGTH) LIQD Take 237 mLs by mouth daily as needed (nutrition).    [provider]  Multiple Vitamins-Minerals (EMERGEN-C VITAMIN C) PACK Take 1 Package by mouth as needed (immune system support).    [provider]  oxyCODONE-acetaminophen (PERCOCET) 5-325 MG tablet Take 1 tablet by mouth every 6 (six) hours as needed for severe pain. 10/05/19 10/04/20  Tommi Rumps, PA-C  predniSONE (DELTASONE) 10 MG tablet Take 6 tablets  today, on day 2 take 5 tablets, day 3 take 4 tablets, day 4 take 3 tablets, day 5 take  2 tablets and 1 tablet the last day 10/05/19   Tommi Rumps, PA-C  triamcinolone ointment (KENALOG) 0.5 % Apply 1 application topically 2 (two) times daily. 10/05/19   Tommi Rumps, PA-C    Allergies Erythromycin  History reviewed. No pertinent family history.  Social History Social History  Tobacco Use  . Smoking status: Former Research scientist (life sciences)  . Smokeless tobacco: Never Used  Substance Use Topics  . Alcohol use: Yes    Comment: social  . Drug use: No    Review of Systems Constitutional: No fever/chills Eyes: No visual changes. ENT: No sore throat. Cardiovascular: Denies chest pain but reports he has had a sense of tightness that does not radiate for the last 3 days.   Respiratory: Denies shortness of breath.  Slight dry cough Gastrointestinal: No abdominal pain.   Genitourinary: Negative for  dysuria. Musculoskeletal: Negative for back pain.  See HPI regarding right first toe Skin: Negative for rash. Neurological: Negative for headaches, areas of focal weakness or numbness.    ____________________________________________   PHYSICAL EXAM:  VITAL SIGNS: ED Triage Vitals  Enc Vitals Group     BP 10/27/19 1535 (!) 148/107     Pulse Rate 10/27/19 1535 (!) 105     Resp 10/27/19 1535 18     Temp 10/27/19 1535 97.8 F (36.6 C)     Temp Source 10/27/19 1535 Oral     SpO2 10/27/19 1535 95 %     Weight 10/27/19 1530 265 lb (120.2 kg)     Height 10/27/19 1530 6\' 2"  (1.88 m)     Head Circumference --      Peak Flow --      Pain Score 10/27/19 1530 6     Pain Loc --      Pain Edu? --      Excl. in [place]? --     Constitutional: Alert and oriented. Well appearing and in no acute distress.  He is resting in the hallway pleasant.  Fully alert conversant.  Able to hold a full conversation with me without nodding off or falling asleep. Eyes: Conjunctivae are normal. Head: Atraumatic. Nose: No congestion/rhinnorhea. Mouth/Throat: Mucous membranes are moist. Neck: No stridor.  Cardiovascular: Normal rate, regular rhythm. Grossly normal heart sounds.  Good peripheral circulation. Respiratory: Normal respiratory effort.  No retractions. Lungs CTAB. Gastrointestinal: Soft and nontender. No distention. Musculoskeletal: No lower extremity tenderness nor edema except his right foot first toe at the MTP joint is slightly swollen and just slightly warm to touch, he reports it is tender to range of motion.  There is no overlying redness streaking up the foot or signs of superinfection or abscess.  Remainder of the foot toes and bilateral major joints of lower extremities appear normal. Neurologic:  Normal speech and language. No gross focal neurologic deficits are appreciated.  Skin:  Skin is warm, dry and intact. No rash noted. Psychiatric: Mood and affect are normal. Speech and behavior are  normal.  ____________________________________________   LABS (all labs ordered are listed, but only abnormal results are displayed)  Labs Reviewed  BASIC METABOLIC PANEL - Abnormal; Notable for the following components:      Result Value   Glucose, Bld 153 (*)    All other components within normal limits  SARS CORONAVIRUS 2 (TAT 6-24 HRS)  CBC  TROPONIN I (HIGH SENSITIVITY)  TROPONIN I (HIGH SENSITIVITY)  TROPONIN I (HIGH SENSITIVITY)   ____________________________________________  EKG  Reviewed interpreted by me at 1550 Heart rate 100 QRS 99 QTc 460 Sinus tachycardia, no evidence of acute ischemia or ectopy. ____________________________________________  RADIOLOGY  DG Chest 2 View  Result Date: 10/27/2019 CLINICAL DATA:  Chest pain for 3 days, shortness of breath, hypertension, previous tobacco abuse EXAM: CHEST - 2 VIEW COMPARISON:  07/27/2019 FINDINGS: Frontal and lateral views  of the chest demonstrate a stable cardiac silhouette. Continued bilateral areas of scarring with flattening of the hemidiaphragms compatible with emphysema. No airspace disease, effusion, or pneumothorax. No acute bony abnormalities. IMPRESSION: 1. Stable emphysema, no acute process. Electronically Signed   By: Sharlet Salina M.D.   On: 10/27/2019 16:02    Imaging reviewed negative for acute changes.  No acute airspace disease ____________________________________________   PROCEDURES  Procedure(s) performed: None  Procedures  Critical Care performed: No  ____________________________________________   INITIAL IMPRESSION / ASSESSMENT AND PLAN / ED COURSE  Pertinent labs & imaging results that were available during my care of the patient were reviewed by me and considered in my medical decision making (see chart for details).   1) HEAR Score: 2   Patient reports atypical chest symptoms tightness across the front of the chest.  Is been persisting for 3 days.  Nonischemic EKG atypical.   Associate also a slight dry cough.  Chest x-ray negative for acute infiltrate.  Will send Covid test.  Suspect likely some sort of either allergy induced cough or possible viral etiology but overall patient appears quite nontoxic stable.  Normal work of breathing  2) possible gout.  Clinical examination and previous history consistent with gout.  Will treat with NSAIDs and steroid  3): "Narcolepsy" I do not know the patient has a true history of narcolepsy, he is fully alert oriented conversant with me.  He does not appear to be acutely fatigued.  He does report that he will frequently feel very tired and not off while standing at work.  At this point I do not see any evidence of encephalopathy or delirium.  He has normal work of breathing and does not appear that he would have hypercapnia and reports this has been an issue for 7 months.  Discussed with patient recommend he make follow-up plans with sleep center or neurology for this and he is in agreement with that plan.  ----------------------------------------- 7:55 PM on 10/27/2019 -----------------------------------------  Patient reports he would like to be able to go home now.  Discussed with him his blood pressure still quite high, but he does not wish to stay to have additional blood pressure lowering.  Discussed recommendations for follow-up, return precautions, and treatment recommendations.  Will place on prednisone as well for gout.  He reports he does not need a refill of his blood pressure medicine, he has it at home he just needs to remember to take it.  Return precautions and treatment recommendations and follow-up discussed with the patient who is agreeable with the plan.  Vitals:   10/27/19 1817 10/27/19 2016  BP: (!) 174/128 (!) 183/116  Pulse: 98 90  Resp: (!) 22   Temp:    SpO2: 96% 96%        ____________________________________________   FINAL CLINICAL IMPRESSION(S) / ED DIAGNOSES  Final diagnoses:  Cough    Acute gout involving toe of right foot, unspecified cause        Note:  This document was prepared using Dragon voice recognition software and may include unintentional dictation errors       Sharyn Creamer, MD 10/27/19 2022

## 2019-10-28 LAB — SARS CORONAVIRUS 2 (TAT 6-24 HRS): SARS Coronavirus 2: NEGATIVE

## 2019-11-10 ENCOUNTER — Other Ambulatory Visit: Payer: Self-pay

## 2019-11-10 ENCOUNTER — Emergency Department
Admission: EM | Admit: 2019-11-10 | Discharge: 2019-11-10 | Disposition: A | Discharge status: Home / Self Care | Payer: 59 | Attending: Emergency Medicine | Admitting: Emergency Medicine

## 2019-11-10 ENCOUNTER — Encounter: Payer: Self-pay | Admitting: Emergency Medicine

## 2019-11-10 ENCOUNTER — Emergency Department: Payer: 59

## 2019-11-10 DIAGNOSIS — M25562 Pain in left knee: Secondary | ICD-10-CM | POA: Insufficient documentation

## 2019-11-10 DIAGNOSIS — Y990 Civilian activity done for income or pay: Secondary | ICD-10-CM | POA: Diagnosis not present

## 2019-11-10 DIAGNOSIS — Y939 Activity, unspecified: Secondary | ICD-10-CM | POA: Diagnosis not present

## 2019-11-10 DIAGNOSIS — I10 Essential (primary) hypertension: Secondary | ICD-10-CM | POA: Insufficient documentation

## 2019-11-10 DIAGNOSIS — Z87891 Personal history of nicotine dependence: Secondary | ICD-10-CM | POA: Insufficient documentation

## 2019-11-10 DIAGNOSIS — W19XXXA Unspecified fall, initial encounter: Secondary | ICD-10-CM | POA: Insufficient documentation

## 2019-11-10 DIAGNOSIS — Z0279 Encounter for issue of other medical certificate: Secondary | ICD-10-CM | POA: Insufficient documentation

## 2019-11-10 DIAGNOSIS — Y9289 Other specified places as the place of occurrence of the external cause: Secondary | ICD-10-CM | POA: Diagnosis not present

## 2019-11-10 NOTE — ED Notes (Signed)
See triage note  Presents s/p fall  States he tripped over something  Landed on left knee  No swelling noted at this time  States he has had some swelling over the weekend

## 2019-11-10 NOTE — ED Triage Notes (Signed)
Pt had mechanical fall 2 days ago. Left knee and ankle pain. Ambulatory. Declined wheelchair. Wants to get checked out and note for work

## 2019-11-10 NOTE — ED Triage Notes (Signed)
First nurse note- Pt tripped and fell.  C/o knee and ankle pain.

## 2019-11-10 NOTE — ED Provider Notes (Signed)
[facility] Emergency Department Provider Note  ____________________________________________  Time seen: Approximately 3:16 PM  I have reviewed the triage vital signs and the nursing notes.   HISTORY  Chief Complaint Fall    HPI Kirk Lyons is a 49 y.o. male that presents to the emergency department for evaluation of left knee pain after fall 2 days ago at work.  Patient wanted to go back to work today but was told by his job that he needed a note to return to work.  He has been walking without difficulty.  He states that his knee is sore.  He would like to return to work.  No additional injuries.  Patient is also requesting a printed copy of his Covid results from 2 weeks ago.   Past Medical History:  Diagnosis Date  . Gout   . Hypertension     There are no problems to display for this patient.   Past Surgical History:  Procedure Laterality Date  . HERNIA REPAIR      Prior to Admission medications   Medication Sig Start Date End Date Taking? Authorizing Provider  albuterol (VENTOLIN HFA) 108 (90 Base) MCG/ACT inhaler Inhale 2 puffs into the lungs every 4 (four) hours as needed for wheezing or shortness of breath. 06/12/19   Irean Hong, MD  feeding supplement (ENSURE CLINICAL STRENGTH) LIQD Take 237 mLs by mouth daily as needed (nutrition).    [provider]  Multiple Vitamins-Minerals (EMERGEN-C VITAMIN C) PACK Take 1 Package by mouth as needed (immune system support).    [provider]  predniSONE (DELTASONE) 20 MG tablet Take 2 tablets (40 mg total) by mouth daily with breakfast. 10/27/19   Sharyn Creamer, MD  triamcinolone ointment (KENALOG) 0.5 % Apply 1 application topically 2 (two) times daily. 10/05/19   Tommi Rumps, PA-C    Allergies Erythromycin  History reviewed. No pertinent family history.  Social History Social History   Tobacco Use  . Smoking status: Former Games developer  . Smokeless tobacco: Never Used   Substance Use Topics  . Alcohol use: Yes    Comment: social  . Drug use: No     Review of Systems  Respiratory: No SOB. Gastrointestinal: No nausea, no vomiting.  Musculoskeletal: Positive for knee soreness. Skin: Negative for rash, abrasions, lacerations, ecchymosis. Neurological: Negative for headaches, numbness or tingling   ____________________________________________   PHYSICAL EXAM:  VITAL SIGNS: ED Triage Vitals  Enc Vitals Group     BP 11/10/19 1302 121/80     Pulse Rate 11/10/19 1302 (!) 110     Resp 11/10/19 1302 16     Temp 11/10/19 1302 97.9 F (36.6 C)     Temp Source 11/10/19 1302 Oral     SpO2 11/10/19 1302 95 %     Weight 11/10/19 1300 265 lb (120.2 kg)     Height 11/10/19 1300 6\' 2"  (1.88 m)     Head Circumference --      Peak Flow --      Pain Score 11/10/19 1300 0     Pain Loc --      Pain Edu? --      Excl. in GC? --      Constitutional: Alert and oriented. Well appearing and in no acute distress. Eyes: Conjunctivae are normal. PERRL. EOMI. Head: Atraumatic. ENT:      Ears:      Nose: No congestion/rhinnorhea.      Mouth/Throat: Mucous membranes are moist.  Neck:  No stridor.  Cardiovascular: Normal rate, regular rhythm.  Good peripheral circulation. Respiratory: Normal respiratory effort without tachypnea or retractions. Lungs CTAB. Good air entry to the bases with no decreased or absent breath sounds. Musculoskeletal: Full range of motion to all extremities. No gross deformities appreciated.  Full range of motion of left knee without difficulty.  No swelling.  Normal gait. Neurologic:  Normal speech and language. No gross focal neurologic deficits are appreciated.  Skin:  Skin is warm, dry and intact. No rash noted. Psychiatric: Mood and affect are normal. Speech and behavior are normal. Patient exhibits appropriate insight and judgement.   ____________________________________________   LABS (all labs ordered are listed, but only  abnormal results are displayed)  Labs Reviewed - No data to display ____________________________________________  EKG   ____________________________________________  RADIOLOGY Lexine Baton, personally viewed and evaluated these images (plain radiographs) as part of my medical decision making, as well as reviewing the written report by the radiologist.  DG Ankle Complete Left  Result Date: 11/10/2019 CLINICAL DATA:  Left ankle pain after fall 2 days ago. EXAM: LEFT ANKLE COMPLETE - 3+ VIEW COMPARISON:  Dec 04, 2008. FINDINGS: There is no evidence of fracture, dislocation, or joint effusion. Mild degenerative changes seen involving the talofibular joint. Soft tissues are unremarkable. 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 No acute abnormality seen in the left ankle. Electronically Signed   By: Lupita Raider M.D.   On: 11/10/2019 13:37   DG Knee Complete 4 Views Left  Result Date: 11/10/2019 CLINICAL DATA:  Acute left knee pain after fall 2 days ago. EXAM: LEFT KNEE - COMPLETE 4+ VIEW COMPARISON:  None. FINDINGS: No evidence of fracture, dislocation, or joint effusion. No evidence of arthropathy or other focal bone abnormality. Soft tissues are unremarkable. IMPRESSION: Negative. Electronically Signed   By: Lupita Raider M.D.   On: 11/10/2019 13:37    ____________________________________________    PROCEDURES  Procedure(s) performed:    Procedures    Medications - No data to display   ____________________________________________   INITIAL IMPRESSION / ASSESSMENT AND PLAN / ED COURSE  Pertinent labs & imaging results that were available during my care of the patient were reviewed by me and considered in my medical decision making (see chart for details).  Review of the [place] CSRS was performed in accordance of the [facility] prior to dispensing any controlled drugs.     Patient presented to emergency department for a work note after a fall 2  days ago.  Vital signs and exam are reassuring.  X-ray negative for acute bony abnormalities.  Work note was provided. Patient is to follow up with primary care as directed.  Referrals were given.  Patient is given ED precautions to return to the ED for any worsening or new symptoms.   KANYON SEIBOLD was evaluated in Emergency Department on 11/10/2019 for the symptoms described in the history of present illness. He was evaluated in the context of the global COVID-19 pandemic, which necessitated consideration that the patient might be at risk for infection with the SARS-CoV-2 virus that causes COVID-19. Institutional protocols and algorithms that pertain to the evaluation of patients at risk for COVID-19 are in a state of rapid change based on information released by regulatory bodies including the CDC and federal and state organizations. These policies and algorithms were followed during the patient's care in the ED.  ____________________________________________  FINAL CLINICAL IMPRESSION(S) / ED DIAGNOSES  Final diagnoses:  Fall, initial  encounter      NEW MEDICATIONS STARTED DURING THIS VISIT:  ED Discharge Orders    None          This chart was dictated using voice recognition software/Dragon. Despite best efforts to proofread, errors can occur which can change the meaning. Any change was purely unintentional.    Laban Emperor, PA-C 11/10/19 1618    Vanessa [place], MD 11/11/19 [phone]

## 2019-12-15 ENCOUNTER — Emergency Department
Admission: EM | Admit: 2019-12-15 | Discharge: 2019-12-15 | Disposition: A | Discharge status: Home / Self Care | Payer: 59 | Attending: Student in an Organized Health Care Education/Training Program | Admitting: Student in an Organized Health Care Education/Training Program

## 2019-12-15 ENCOUNTER — Other Ambulatory Visit: Payer: Self-pay

## 2019-12-15 DIAGNOSIS — Z79899 Other long term (current) drug therapy: Secondary | ICD-10-CM | POA: Insufficient documentation

## 2019-12-15 DIAGNOSIS — M109 Gout, unspecified: Secondary | ICD-10-CM | POA: Diagnosis not present

## 2019-12-15 DIAGNOSIS — M79674 Pain in right toe(s): Secondary | ICD-10-CM | POA: Diagnosis present

## 2019-12-15 DIAGNOSIS — Z87891 Personal history of nicotine dependence: Secondary | ICD-10-CM | POA: Diagnosis not present

## 2019-12-15 DIAGNOSIS — I10 Essential (primary) hypertension: Secondary | ICD-10-CM | POA: Insufficient documentation

## 2019-12-15 MED ORDER — TRAMADOL HCL 50 MG PO TABS: 50.0000 mg | ORAL_TABLET | Freq: Two times a day (BID) | ORAL | 0 refills | Status: DC | PRN

## 2019-12-15 MED ORDER — TRAMADOL HCL 50 MG PO TABS
50.0000 mg | ORAL_TABLET | Freq: Once | ORAL | Status: AC
  Administered 2019-12-15: 50 mg via ORAL
  Filled 2019-12-15: qty 1

## 2019-12-15 MED ORDER — LIDOCAINE 5 % EX PTCH
1.0000 | MEDICATED_PATCH | CUTANEOUS | Status: DC
  Administered 2019-12-15: 1 via TRANSDERMAL
  Filled 2019-12-15: qty 1

## 2019-12-15 NOTE — Discharge Instructions (Addendum)
Continue previous medications as directed.  Wear Lidoderm patch for 12 hours.

## 2019-12-15 NOTE — ED Triage Notes (Signed)
Pt c/o gout flare up in right great toe/foot for the past 2 days, has been taking prednisone and Colchicine with no relief.

## 2019-12-15 NOTE — ED Provider Notes (Signed)
[facility] Emergency Department Provider Note   ____________________________________________   First MD Initiated Contact with Patient 12/15/19 1056     (approximate)  I have reviewed the triage vital signs and the nursing notes.   HISTORY  Chief Complaint Gout    HPI Kirk Lyons is a 49 y.o. male patient complain of pain and edema to the right great toe for 2 days.  Patient was seen by urgent care clinic placed on prednisone and colchicine.  Patient stated no definitive relief with these medications.  Patient rates pain as 4/10 but increases to a 7/10 with weightbearing and ambulation.  Patient described the pain as "achy".         Past Medical History:  Diagnosis Date  . Gout   . Hypertension     There are no problems to display for this patient.   Past Surgical History:  Procedure Laterality Date  . HERNIA REPAIR      Prior to Admission medications   Medication Sig Start Date End Date Taking? Authorizing Provider  albuterol (VENTOLIN HFA) 108 (90 Base) MCG/ACT inhaler Inhale 2 puffs into the lungs every 4 (four) hours as needed for wheezing or shortness of breath. 06/12/19   Irean Hong, MD  feeding supplement (ENSURE CLINICAL STRENGTH) LIQD Take 237 mLs by mouth daily as needed (nutrition).    [provider]  Multiple Vitamins-Minerals (EMERGEN-C VITAMIN C) PACK Take 1 Package by mouth as needed (immune system support).    [provider]  traMADol (ULTRAM) 50 MG tablet Take 1 tablet (50 mg total) by mouth every 12 (twelve) hours as needed. 12/15/19   Joni Reining, PA-C  triamcinolone ointment (KENALOG) 0.5 % Apply 1 application topically 2 (two) times daily. 10/05/19   Tommi Rumps, PA-C    Allergies Erythromycin  No family history on file.  Social History Social History   Tobacco Use  . Smoking status: Former Games developer  . Smokeless tobacco: Never Used  Substance Use Topics  . Alcohol use: Yes   Comment: social  . Drug use: No    Review of Systems Constitutional: No fever/chills Eyes: No visual changes. ENT: No sore throat. Cardiovascular: Denies chest pain. Respiratory: Denies shortness of breath. Gastrointestinal: No abdominal pain.  No nausea, no vomiting.  No diarrhea.  No constipation. Genitourinary: Negative for dysuria. Musculoskeletal: Right great toe pain. Skin: Negative for rash.  Edema right great toe. Neurological: Negative for headaches, focal weakness or numbness. Endocrine:  Gout and hypertension Allergic/Immunilogical: Erythromycin ____________________________________________   PHYSICAL EXAM:  VITAL SIGNS: ED Triage Vitals  Enc Vitals Group     BP 12/15/19 1044 (!) 140/94     Pulse Rate 12/15/19 1044 100     Resp 12/15/19 1044 18     Temp 12/15/19 1044 (!) 97.5 F (36.4 C)     Temp Source 12/15/19 1044 Oral     SpO2 12/15/19 1044 95 %     Weight 12/15/19 1040 270 lb (122.5 kg)     Height 12/15/19 1040 6\' 1"  (1.854 m)     Head Circumference --      Peak Flow --      Pain Score 12/15/19 1039 4     Pain Loc --      Pain Edu? --      Excl. in GC? --    Constitutional: Alert and oriented. Well appearing and in no acute distress. Cardiovascular: Normal rate, regular rhythm. Grossly normal heart sounds.  Good peripheral circulation. Respiratory: Normal respiratory effort.  No retractions. Lungs CTAB. Musculoskeletal: Edema to the right great toe.  Moderate guarding palpation of the right great toe.   Neurologic:  Normal speech and language. No gross focal neurologic deficits are appreciated. No gait instability. Skin:  Skin is warm, dry and intact. No rash noted.  Edema right great toe. Psychiatric: Mood and affect are normal. Speech and behavior are normal.  ____________________________________________   LABS (all labs ordered are listed, but only abnormal results are displayed)  Labs Reviewed - No data to  display ____________________________________________  EKG   ____________________________________________  RADIOLOGY  ED MD interpretation:    Official radiology report(s): No results found.  ____________________________________________   PROCEDURES  Procedure(s) performed (including Critical Care):  Procedures   ____________________________________________   INITIAL IMPRESSION / ASSESSMENT AND PLAN / ED COURSE  As part of my medical decision making, I reviewed the following data within the [facility]     Patient complain of pain and edema to right great toe consistent with gout.  Patient advised continue previous medication given by urgent care clinic and start tramadol as directed.  Patient advised [facility] care with [facility].   Kirk Lyons was evaluated in Emergency Department on 12/15/2019 for the symptoms described in the history of present illness. He was evaluated in the context of the global COVID-19 pandemic, which necessitated consideration that the patient might be at risk for infection with the SARS-CoV-2 virus that causes COVID-19. Institutional protocols and algorithms that pertain to the evaluation of patients at risk for COVID-19 are in a state of rapid change based on information released by regulatory bodies including the CDC and federal and state organizations. These policies and algorithms were followed during the patient's care in the ED.       ____________________________________________   FINAL CLINICAL IMPRESSION(S) / ED DIAGNOSES  Final diagnoses:  Gouty arthritis of right great toe     ED Discharge Orders         Ordered    traMADol (ULTRAM) 50 MG tablet  Every 12 hours PRN     12/15/19 1132           Note:  This document was prepared using Dragon voice recognition software and may include unintentional dictation errors.    Sable Feil, PA-C 12/15/19 1140    Merlyn Lot, MD 12/15/19  1253

## 2020-03-06 ENCOUNTER — Other Ambulatory Visit: Payer: Self-pay

## 2020-03-06 ENCOUNTER — Emergency Department
Admission: EM | Admit: 2020-03-06 | Discharge: 2020-03-06 | Disposition: A | Discharge status: Home / Self Care | Payer: 59 | Attending: Emergency Medicine | Admitting: Emergency Medicine

## 2020-03-06 ENCOUNTER — Encounter: Payer: Self-pay | Admitting: Intensive Care

## 2020-03-06 DIAGNOSIS — Z760 Encounter for issue of repeat prescription: Secondary | ICD-10-CM | POA: Diagnosis not present

## 2020-03-06 DIAGNOSIS — M10071 Idiopathic gout, right ankle and foot: Secondary | ICD-10-CM | POA: Insufficient documentation

## 2020-03-06 DIAGNOSIS — I10 Essential (primary) hypertension: Secondary | ICD-10-CM | POA: Diagnosis not present

## 2020-03-06 DIAGNOSIS — M109 Gout, unspecified: Secondary | ICD-10-CM

## 2020-03-06 DIAGNOSIS — Z87891 Personal history of nicotine dependence: Secondary | ICD-10-CM | POA: Diagnosis not present

## 2020-03-06 MED ORDER — COLCHICINE 0.6 MG PO TABS: ORAL_TABLET | ORAL | 0 refills | Status: DC

## 2020-03-06 MED ORDER — AMLODIPINE BESYLATE 5 MG PO TABS: 5.0000 mg | ORAL_TABLET | Freq: Every day | ORAL | 0 refills | Status: DC

## 2020-03-06 MED ORDER — HYDROCODONE-ACETAMINOPHEN 5-325 MG PO TABS: 1.0000 | ORAL_TABLET | Freq: Four times a day (QID) | ORAL | 0 refills | Status: AC | PRN

## 2020-03-06 MED ORDER — ALBUTEROL SULFATE HFA 108 (90 BASE) MCG/ACT IN AERS
2.0000 | INHALATION_SPRAY | RESPIRATORY_TRACT | 0 refills | Status: DC | PRN
Start: 2020-03-06 — End: 2020-06-06

## 2020-03-06 NOTE — ED Provider Notes (Signed)
[facility] Emergency Department Provider Note  ____________________________________________  Time seen: Approximately 5:04 PM  I have reviewed the triage vital signs and the nursing notes.   HISTORY  Chief Complaint Gout   HPI Kirk Lyons is a 49 y.o. male who presents to the emergency department for treatment and evaluation of gout in right foot and medication refill. Gout flare started 6 days ago and is getting better but he would like a refill of his medications and a work note. He is also almost out of his amlodipine and albuterol. He hasn't been able to get in to see his PCP.  Past Medical History:  Diagnosis Date  . Gout   . Hypertension     There are no problems to display for this patient.   Past Surgical History:  Procedure Laterality Date  . HERNIA REPAIR      Prior to Admission medications   Medication Sig Start Date End Date Taking? Authorizing Provider  albuterol (VENTOLIN HFA) 108 (90 Base) MCG/ACT inhaler Inhale 2 puffs into the lungs every 4 (four) hours as needed for wheezing or shortness of breath. 03/06/20   Josefa Syracuse B, FNP  colchicine 0.6 MG tablet Take 2 tabs at onset of gout flare then 1 tab in an hour if pain continues. No more than 3 tablets in 24 hours. 03/06/20   Yuriy Cui, Rulon Eisenmenger B, FNP  feeding supplement (ENSURE CLINICAL STRENGTH) LIQD Take 237 mLs by mouth daily as needed (nutrition).    [provider]  HYDROcodone-acetaminophen (NORCO/VICODIN) 5-325 MG tablet Take 1 tablet by mouth every 6 (six) hours as needed for up to 3 days for severe pain. 03/06/20 03/09/20  Chinita Pester, FNP  Multiple Vitamins-Minerals (EMERGEN-C VITAMIN C) PACK Take 1 Package by mouth as needed (immune system support).    [provider]  traMADol (ULTRAM) 50 MG tablet Take 1 tablet (50 mg total) by mouth every 12 (twelve) hours as needed. 12/15/19   Joni Reining, PA-C  triamcinolone ointment (KENALOG) 0.5 % Apply 1  application topically 2 (two) times daily. 10/05/19   Tommi Rumps, PA-C    Allergies Erythromycin  History reviewed. No pertinent family history.  Social History Social History   Tobacco Use  . Smoking status: Former Games developer  . Smokeless tobacco: Never Used  Substance Use Topics  . Alcohol use: Yes    Alcohol/week: 1.0 standard drink    Types: 1 Cans of beer per week    Comment: social  . Drug use: No    Review of Systems Constitutional: Negative for fever. ENT: Negative for sore throat. Respiratory: Negative for shortness of breath. Gastrointestinal: No abdominal pain.  No nausea, no vomiting.  No diarrhea.  Musculoskeletal: Positive for right foot pain. Skin: Negative for rash/lesion/wound. Positive for erythema of right foot. Neurological: Negative for headaches, focal weakness or numbness.  ____________________________________________   PHYSICAL EXAM:  VITAL SIGNS: ED Triage Vitals  Enc Vitals Group     BP 03/06/20 1541 (!) 143/112     Pulse Rate 03/06/20 1541 100     Resp 03/06/20 1541 18     Temp 03/06/20 1541 98.3 F (36.8 C)     Temp Source 03/06/20 1541 Oral     SpO2 03/06/20 1541 96 %     Weight 03/06/20 1543 255 lb (115.7 kg)     Height 03/06/20 1543 6\' 2"  (1.88 m)     Head Circumference --      Peak Flow --  Pain Score 03/06/20 1543 0     Pain Loc --      Pain Edu? --      Excl. in GC? --     Constitutional: Alert and oriented. Well appearing and in no acute distress. Eyes: Conjunctivae are normal. Head: Atraumatic. Nose: No congestion/rhinnorhea. Mouth/Throat: Mucous membranes are moist. Neck: No stridor.  Cardiovascular: Normal rate, regular rhythm. Good peripheral circulation. Respiratory: Normal respiratory effort. Musculoskeletal: Full ROM throughout. Hallux valgus right great toe. Neurologic:  Normal speech and language. No gross focal neurologic deficits are appreciated. Speech is normal. No gait instability. Skin:  Skin is  warm, dry and intact. No rash noted. Mild erythema of the right great toe and foot. Psychiatric: Mood and affect are normal. Speech and behavior are normal.  ____________________________________________   LABS (all labs ordered are listed, but only abnormal results are displayed)  Labs Reviewed - No data to display ____________________________________________  EKG  Not indicated. ____________________________________________  RADIOLOGY  Not indicated. ____________________________________________   PROCEDURES  None ____________________________________________   INITIAL IMPRESSION / ASSESSMENT AND PLAN / ED COURSE    49 year old male presenting to the emergency department for treatment valuation of gout and also like refills of medications.  See HPI for further details.  Patient prescribed, albuterol, and amlodipine.  He will be provided a work excuse to return tomorrow.  He is to call and schedule follow-up appointment with his PCP for additional refills of his medications.  Pertinent labs & imaging results that were available during my care of the patient were reviewed by me and considered in my medical decision making (see chart for details).  ____________________________________________   FINAL CLINICAL IMPRESSION(S) / ED DIAGNOSES  Final diagnoses:  Acute gout involving toe of right foot, unspecified cause  Medication refill       Chinita Pester, FNP 03/06/20 2015    Jene Every, MD 03/07/20 [phone]

## 2020-03-06 NOTE — ED Triage Notes (Signed)
Patient c/o gout flareup in the past week on right foot. Also wants refill on htn and inhaler

## 2020-03-25 ENCOUNTER — Emergency Department
Admission: EM | Admit: 2020-03-25 | Discharge: 2020-03-25 | Disposition: A | Discharge status: Home / Self Care | Payer: 59 | Attending: Emergency Medicine | Admitting: Emergency Medicine

## 2020-03-25 ENCOUNTER — Encounter: Payer: Self-pay | Admitting: Emergency Medicine

## 2020-03-25 ENCOUNTER — Other Ambulatory Visit: Payer: Self-pay

## 2020-03-25 DIAGNOSIS — Z79899 Other long term (current) drug therapy: Secondary | ICD-10-CM | POA: Diagnosis not present

## 2020-03-25 DIAGNOSIS — I10 Essential (primary) hypertension: Secondary | ICD-10-CM | POA: Insufficient documentation

## 2020-03-25 DIAGNOSIS — M10071 Idiopathic gout, right ankle and foot: Secondary | ICD-10-CM | POA: Diagnosis not present

## 2020-03-25 DIAGNOSIS — M109 Gout, unspecified: Secondary | ICD-10-CM

## 2020-03-25 DIAGNOSIS — Z87891 Personal history of nicotine dependence: Secondary | ICD-10-CM | POA: Diagnosis not present

## 2020-03-25 MED ORDER — HYDROCODONE-ACETAMINOPHEN 5-325 MG PO TABS: 1.0000 | ORAL_TABLET | ORAL | 0 refills | Status: DC | PRN

## 2020-03-25 MED ORDER — COLCHICINE 0.6 MG PO TABS
0.6000 mg | ORAL_TABLET | Freq: Every day | ORAL | 2 refills | Status: DC
Start: 2020-03-25 — End: 2020-08-04

## 2020-03-25 MED ORDER — COLCHICINE 0.6 MG PO TABS
1.8000 mg | ORAL_TABLET | Freq: Once | ORAL | Status: AC
  Administered 2020-03-25: 1.8 mg via ORAL
  Filled 2020-03-25: qty 3

## 2020-03-25 MED ORDER — ALLOPURINOL 100 MG PO TABS: 100.0000 mg | ORAL_TABLET | Freq: Every day | ORAL | 5 refills | Status: DC

## 2020-03-25 NOTE — ED Notes (Signed)
See triage note Presents with gout flare   States he is out of his meds

## 2020-03-25 NOTE — ED Triage Notes (Signed)
Pt reports gout flare up for the past few days. Pt will need refil on colchicine

## 2020-03-25 NOTE — ED Provider Notes (Signed)
[facility] Emergency Department Provider Note  ____________________________________________  Time seen: Approximately 4:09 PM  I have reviewed the triage vital signs and the nursing notes.   HISTORY  Chief Complaint Gout    HPI Kirk Lyons is a 49 y.o. male who presents to emergency department for repeat gout flare to the great toe of the right foot.  Patient has had 4 episodes of this this year.  He states that the last episode was 2 weeks ago.  He has been concentrating on changing lifestyle trying to manage his gout.  Patient is requesting a refill of colchicine at this time.         Past Medical History:  Diagnosis Date  . Gout   . Hypertension     There are no problems to display for this patient.   Past Surgical History:  Procedure Laterality Date  . HERNIA REPAIR      Prior to Admission medications   Medication Sig Start Date End Date Taking? Authorizing Provider  albuterol (VENTOLIN HFA) 108 (90 Base) MCG/ACT inhaler Inhale 2 puffs into the lungs every 4 (four) hours as needed for wheezing or shortness of breath. 03/06/20   Triplett, Cari B, FNP  allopurinol (ZYLOPRIM) 100 MG tablet Take 1 tablet (100 mg total) by mouth daily. 03/25/20 03/25/21  Tyshan Enderle, Delorise Royals, PA-C  amLODipine (NORVASC) 5 MG tablet Take 1 tablet (5 mg total) by mouth daily. 03/06/20 03/06/21  Triplett, Rulon Eisenmenger B, FNP  colchicine 0.6 MG tablet Take 1 tablet (0.6 mg total) by mouth daily. Take 1 tab daily for at least 6 more days. If  Symptoms persist past 6 days continue to use until prescription is finished 03/25/20   Hedwig Mcfall, Christiane Ha D, PA-C  feeding supplement (ENSURE CLINICAL STRENGTH) LIQD Take 237 mLs by mouth daily as needed (nutrition).    [provider]  HYDROcodone-acetaminophen (NORCO/VICODIN) 5-325 MG tablet Take 1 tablet by mouth every 4 (four) hours as needed for moderate pain. 03/25/20   Jahmir Salo, Delorise Royals, PA-C  Multiple Vitamins-Minerals  (EMERGEN-C VITAMIN C) PACK Take 1 Package by mouth as needed (immune system support).    [provider]  traMADol (ULTRAM) 50 MG tablet Take 1 tablet (50 mg total) by mouth every 12 (twelve) hours as needed. 12/15/19   Joni Reining, PA-C  triamcinolone ointment (KENALOG) 0.5 % Apply 1 application topically 2 (two) times daily. 10/05/19   Tommi Rumps, PA-C    Allergies Erythromycin  No family history on file.  Social History Social History   Tobacco Use  . Smoking status: Former Games developer  . Smokeless tobacco: Never Used  Substance Use Topics  . Alcohol use: Yes    Alcohol/week: 1.0 standard drink    Types: 1 Cans of beer per week    Comment: social  . Drug use: No     Review of Systems  Constitutional: No fever/chills Eyes: No visual changes. No discharge ENT: No upper respiratory complaints. Cardiovascular: no chest pain. Respiratory: no cough. No SOB. Gastrointestinal: No abdominal pain.  No nausea, no vomiting.  No diarrhea.  No constipation. Musculoskeletal: Gout flare to the great toe right foot Skin: Negative for rash, abrasions, lacerations, ecchymosis. Neurological: Negative for headaches, focal weakness or numbness. 10-point ROS otherwise negative.  ____________________________________________   PHYSICAL EXAM:  VITAL SIGNS: ED Triage Vitals  Enc Vitals Group     BP 03/25/20 1409 (!) 148/91     Pulse Rate 03/25/20 1409 (!) 118  Resp 03/25/20 1409 17     Temp 03/25/20 1409 98.7 F (37.1 C)     Temp Source 03/25/20 1409 Oral     SpO2 03/25/20 1409 98 %     Weight 03/25/20 1333 255 lb (115.7 kg)     Height 03/25/20 1333 6\' 2"  (1.88 m)     Head Circumference --      Peak Flow --      Pain Score 03/25/20 1332 4     Pain Loc --      Pain Edu? --      Excl. in GC? --      Constitutional: Alert and oriented. Well appearing and in no acute distress. Eyes: Conjunctivae are normal. PERRL. EOMI. Head: Atraumatic. ENT:      Ears:        Nose: No congestion/rhinnorhea.      Mouth/Throat: Mucous membranes are moist.  Neck: No stridor.    Cardiovascular: Normal rate, regular rhythm. Normal S1 and S2.  Good peripheral circulation. Respiratory: Normal respiratory effort without tachypnea or retractions. Lungs CTAB. Good air entry to the bases with no decreased or absent breath sounds. Musculoskeletal: Full range of motion to all extremities. No gross deformities appreciated.  Erythematous and edematous MTP joint of the great toe consistent with gout flare.  No cellulitic skin changes.  No other findings to the right foot.  Sensation capillary refill intact. Neurologic:  Normal speech and language. No gross focal neurologic deficits are appreciated.  Skin:  Skin is warm, dry and intact. No rash noted. Psychiatric: Mood and affect are normal. Speech and behavior are normal. Patient exhibits appropriate insight and judgement.   ____________________________________________   LABS (all labs ordered are listed, but only abnormal results are displayed)  Labs Reviewed - No data to display ____________________________________________  EKG   ____________________________________________  RADIOLOGY   No results found.  ____________________________________________    PROCEDURES  Procedure(s) performed:    Procedures    Medications  colchicine tablet 1.8 mg (has no administration in time range)     ____________________________________________   INITIAL IMPRESSION / ASSESSMENT AND PLAN / ED COURSE  Pertinent labs & imaging results that were available during my care of the patient were reviewed by me and considered in my medical decision making (see chart for details).  Review of the [place] CSRS was performed in accordance of the [facility] prior to dispensing any controlled drugs.           Patient's diagnosis is consistent with gout flare to the great toe of the right foot.  Patient presented to the emergency  department requesting colchicine for flare to the great toe.  No other concerns today.  Findings are consistent with gout.  Patient will be given first dose of colchicine here.  I will prescribe the patient colchicine, limited Norco.  Given the patient's repetitive gout flare this year, I recommended starting allopurinol.  Patient is agreeable with this plan..  Follow-up with primary care as needed.  Patient is given ED precautions to return to the ED for any worsening or new symptoms.     ____________________________________________  FINAL CLINICAL IMPRESSION(S) / ED DIAGNOSES  Final diagnoses:  Acute gout involving toe of right foot, unspecified cause      NEW MEDICATIONS STARTED DURING THIS VISIT:  ED Discharge Orders         Ordered    colchicine 0.6 MG tablet  Daily        03/25/20 1620  allopurinol (ZYLOPRIM) 100 MG tablet  Daily        03/25/20 1620    HYDROcodone-acetaminophen (NORCO/VICODIN) 5-325 MG tablet  Every 4 hours PRN        03/25/20 1620              This chart was dictated using voice recognition software/Dragon. Despite best efforts to proofread, errors can occur which can change the meaning. Any change was purely unintentional.    Racheal Patches, PA-C 03/25/20 1622    Shaune Pollack, MD 03/25/20 1904

## 2020-06-06 ENCOUNTER — Encounter: Payer: Self-pay | Admitting: Emergency Medicine

## 2020-06-06 ENCOUNTER — Emergency Department: Payer: 59

## 2020-06-06 ENCOUNTER — Other Ambulatory Visit: Payer: Self-pay

## 2020-06-06 ENCOUNTER — Emergency Department
Admission: EM | Admit: 2020-06-06 | Discharge: 2020-06-06 | Disposition: A | Discharge status: Home / Self Care | Payer: 59 | Attending: Emergency Medicine | Admitting: Emergency Medicine

## 2020-06-06 DIAGNOSIS — J40 Bronchitis, not specified as acute or chronic: Secondary | ICD-10-CM | POA: Insufficient documentation

## 2020-06-06 DIAGNOSIS — Z79899 Other long term (current) drug therapy: Secondary | ICD-10-CM | POA: Insufficient documentation

## 2020-06-06 DIAGNOSIS — R079 Chest pain, unspecified: Secondary | ICD-10-CM | POA: Diagnosis present

## 2020-06-06 DIAGNOSIS — F17200 Nicotine dependence, unspecified, uncomplicated: Secondary | ICD-10-CM | POA: Diagnosis not present

## 2020-06-06 DIAGNOSIS — Z20822 Contact with and (suspected) exposure to covid-19: Secondary | ICD-10-CM | POA: Diagnosis not present

## 2020-06-06 DIAGNOSIS — I10 Essential (primary) hypertension: Secondary | ICD-10-CM | POA: Insufficient documentation

## 2020-06-06 LAB — TROPONIN I (HIGH SENSITIVITY)
Troponin I (High Sensitivity): 19 ng/L — ABNORMAL HIGH (ref <18)
Troponin I (High Sensitivity): 15 ng/L (ref <18)

## 2020-06-06 LAB — CBC
HCT: 49.5 % (ref 39.0–52.0)
Hemoglobin: 16.9 g/dL (ref 13.0–17.0)
MCH: 32 pg (ref 26.0–34.0)
MCHC: 34.1 g/dL (ref 30.0–36.0)
MCV: 93.8 fL (ref 80.0–100.0)
Platelets: 327 10*3/uL (ref 150–400)
RBC: 5.28 MIL/uL (ref 4.22–5.81)
RDW: 11.8 % (ref 11.5–15.5)
WBC: 8.9 10*3/uL (ref 4.0–10.5)
nRBC: 0 % (ref 0.0–0.2)

## 2020-06-06 LAB — RESP PANEL BY RT-PCR (FLU A&B, COVID) ARPGX2
Influenza A by PCR: NEGATIVE
Influenza B by PCR: NEGATIVE
SARS Coronavirus 2 by RT PCR: NEGATIVE

## 2020-06-06 LAB — BASIC METABOLIC PANEL
Anion gap: 12 (ref 5–15)
BUN: 16 mg/dL (ref 6–20)
CO2: 24 mmol/L (ref 22–32)
Calcium: 9.1 mg/dL (ref 8.9–10.3)
Chloride: 101 mmol/L (ref 98–111)
Creatinine, Ser: 1.1 mg/dL (ref 0.61–1.24)
GFR, Estimated: >60 mL/min (ref >60)
Glucose, Bld: 142 mg/dL — ABNORMAL HIGH (ref 70–99)
Potassium: 3.9 mmol/L (ref 3.5–5.1)
Sodium: 137 mmol/L (ref 135–145)

## 2020-06-06 MED ORDER — ALBUTEROL SULFATE HFA 108 (90 BASE) MCG/ACT IN AERS
2.0000 | INHALATION_SPRAY | Freq: Four times a day (QID) | RESPIRATORY_TRACT | 0 refills | Status: DC | PRN
Start: 2020-06-06 — End: 2022-02-06

## 2020-06-06 NOTE — ED Provider Notes (Signed)
[facility] Emergency Department Provider Note   ____________________________________________   First MD Initiated Contact with Patient 06/06/20 1308     (approximate)  I have reviewed the triage vital signs and the nursing notes.   HISTORY  Chief Complaint Cough, Chest Congestion, and Shortness of Breath    HPI Kirk Lyons is a 49 y.o. male with past medical history of hypertension and gout who presents to the ED complaining of chest pain.  Patient reports that he has been dealing with intermittent chest pressure for the past couple of days associated with a cough productive of greenish sputum.  He will occasionally have difficulty breathing, but denies any shortness of breath currently.  He felt feverish last night but has not checked his temperature.  He is concerned he could have pneumonia because current symptoms feel similar.  Symptoms do seem to be worse at night and he currently denies any chest pain.        Past Medical History:  Diagnosis Date  . Gout   . Hypertension     There are no problems to display for this patient.   Past Surgical History:  Procedure Laterality Date  . HERNIA REPAIR      Prior to Admission medications   Medication Sig Start Date End Date Taking? Authorizing Provider  albuterol (VENTOLIN HFA) 108 (90 Base) MCG/ACT inhaler Inhale 2 puffs into the lungs every 6 (six) hours as needed for wheezing or shortness of breath. 06/06/20   Chesley Noon, MD  allopurinol (ZYLOPRIM) 100 MG tablet Take 1 tablet (100 mg total) by mouth daily. 03/25/20 03/25/21  Cuthriell, Delorise Royals, PA-C  amLODipine (NORVASC) 5 MG tablet Take 1 tablet (5 mg total) by mouth daily. 03/06/20 03/06/21  Triplett, Rulon Eisenmenger B, FNP  colchicine 0.6 MG tablet Take 1 tablet (0.6 mg total) by mouth daily. Take 1 tab daily for at least 6 more days. If  Symptoms persist past 6 days continue to use until prescription is finished 03/25/20   Cuthriell, Christiane Ha D, PA-C    feeding supplement (ENSURE CLINICAL STRENGTH) LIQD Take 237 mLs by mouth daily as needed (nutrition).    [provider]  HYDROcodone-acetaminophen (NORCO/VICODIN) 5-325 MG tablet Take 1 tablet by mouth every 4 (four) hours as needed for moderate pain. 03/25/20   Cuthriell, Delorise Royals, PA-C  Multiple Vitamins-Minerals (EMERGEN-C VITAMIN C) PACK Take 1 Package by mouth as needed (immune system support).    [provider]  traMADol (ULTRAM) 50 MG tablet Take 1 tablet (50 mg total) by mouth every 12 (twelve) hours as needed. 12/15/19   Joni Reining, PA-C  triamcinolone ointment (KENALOG) 0.5 % Apply 1 application topically 2 (two) times daily. 10/05/19   Tommi Rumps, PA-C    Allergies Erythromycin  History reviewed. No pertinent family history.  Social History Social History   Tobacco Use  . Smoking status: Current Some Day Smoker  . Smokeless tobacco: Never Used  Substance Use Topics  . Alcohol use: Yes    Alcohol/week: 1.0 standard drink    Types: 1 Cans of beer per week    Comment: social  . Drug use: No    Review of Systems  Constitutional: Positive for subjective fever/chills Eyes: No visual changes. ENT: No sore throat. Cardiovascular: Positive for chest pain. Respiratory: Positive for cough and shortness of breath. Gastrointestinal: No abdominal pain.  No nausea, no vomiting.  No diarrhea.  No constipation. Genitourinary: Negative for dysuria. Musculoskeletal: Negative for back pain.  Skin: Negative for rash. Neurological: Negative for headaches, focal weakness or numbness.  ____________________________________________   PHYSICAL EXAM:  VITAL SIGNS: ED Triage Vitals  Enc Vitals Group     BP 06/06/20 1203 (!) 168/110     Pulse Rate 06/06/20 1203 (!) 113     Resp 06/06/20 1203 20     Temp 06/06/20 1203 98.3 F (36.8 C)     Temp Source 06/06/20 1203 Oral     SpO2 06/06/20 1203 94 %     Weight 06/06/20 1202 240 lb (108.9 kg)     Height  06/06/20 1202 6\' 2"  (1.88 m)     Head Circumference --      Peak Flow --      Pain Score 06/06/20 1202 7     Pain Loc --      Pain Edu? --      Excl. in GC? --     Constitutional: Alert and oriented. Eyes: Conjunctivae are normal. Head: Atraumatic. Nose: No congestion/rhinnorhea. Mouth/Throat: Mucous membranes are moist. Neck: Normal ROM Cardiovascular: Normal rate, regular rhythm. Grossly normal heart sounds. Respiratory: Normal respiratory effort.  No retractions. Lungs CTAB. Gastrointestinal: Soft and nontender. No distention. Genitourinary: deferred Musculoskeletal: No lower extremity tenderness nor edema. Neurologic:  Normal speech and language. No gross focal neurologic deficits are appreciated. Skin:  Skin is warm, dry and intact. No rash noted. Psychiatric: Mood and affect are normal. Speech and behavior are normal.  ____________________________________________   LABS (all labs ordered are listed, but only abnormal results are displayed)  Labs Reviewed  BASIC METABOLIC PANEL - Abnormal; Notable for the following components:      Result Value   Glucose, Bld 142 (*)    All other components within normal limits  TROPONIN I (HIGH SENSITIVITY) - Abnormal; Notable for the following components:   Troponin I (High Sensitivity) 19 (*)    All other components within normal limits  RESP PANEL BY RT-PCR (FLU A&B, COVID) ARPGX2  CBC  TROPONIN I (HIGH SENSITIVITY)   ____________________________________________  EKG  ED ECG REPORT I, 06/08/20, the attending physician, personally viewed and interpreted this ECG.   Date: 06/06/2020  EKG Time: 12:01  Rate: 110  Rhythm: sinus tachycardia  Axis: Normal  Intervals:none  ST&T Change: None   PROCEDURES  Procedure(s) performed (including Critical Care):  Procedures   ____________________________________________   INITIAL IMPRESSION / ASSESSMENT AND PLAN / ED COURSE       49 year old male with past medical  history of hypertension and gout who presents to the ED complaining of 3 days of productive cough, intermittent chest pain, and mild difficulty breathing.  He is not in any respiratory distress and is maintaining O2 sats on room air.  EKG shows no evidence of arrhythmia or ischemia and initial troponin is negative, low suspicion for ACS given atypical symptoms.  I doubt PE given he has no chest pain currently and no apparent risk factors for PE.  Chest x-ray reviewed by me and shows no infiltrate, edema, or effusion.  Symptoms likely due to bronchitis, we will repeat second set troponin and if this is negative patient will be appropriate for discharge home with symptomatic treatment.  Repeat troponin within normal limits, very low suspicion for ACS given heart score of less than 4.  Patient is appropriate for discharge home with symptomatic treatment for bronchitis, COVID-19 testing is negative.  Patient counseled to follow-up with PCP and otherwise return to the ED for new worsening symptoms, patient agrees  with plan.      ____________________________________________   FINAL CLINICAL IMPRESSION(S) / ED DIAGNOSES  Final diagnoses:  Bronchitis     ED Discharge Orders         Ordered    albuterol (VENTOLIN HFA) 108 (90 Base) MCG/ACT inhaler  Every 6 hours PRN       Note to Pharmacy: Please supply with spacer   06/06/20 1516           Note:  This document was prepared using Dragon voice recognition software and may include unintentional dictation errors.   Chesley Noon, MD 06/06/20 1517

## 2020-06-06 NOTE — ED Triage Notes (Signed)
Pt arrived via POV with c/o cough, chest congestion x 3 days, pt has hx of PNA, states feels similar.   Pt is occassional smoker.

## 2020-07-01 ENCOUNTER — Emergency Department
Admission: EM | Admit: 2020-07-01 | Discharge: 2020-07-01 | Disposition: A | Discharge status: Home / Self Care | Payer: 59 | Attending: Emergency Medicine | Admitting: Emergency Medicine

## 2020-07-01 ENCOUNTER — Other Ambulatory Visit: Payer: Self-pay

## 2020-07-01 ENCOUNTER — Encounter: Payer: Self-pay | Admitting: Intensive Care

## 2020-07-01 DIAGNOSIS — I10 Essential (primary) hypertension: Secondary | ICD-10-CM | POA: Insufficient documentation

## 2020-07-01 DIAGNOSIS — Z79899 Other long term (current) drug therapy: Secondary | ICD-10-CM | POA: Diagnosis not present

## 2020-07-01 DIAGNOSIS — R609 Edema, unspecified: Secondary | ICD-10-CM | POA: Insufficient documentation

## 2020-07-01 DIAGNOSIS — Z760 Encounter for issue of repeat prescription: Secondary | ICD-10-CM | POA: Insufficient documentation

## 2020-07-01 DIAGNOSIS — F17210 Nicotine dependence, cigarettes, uncomplicated: Secondary | ICD-10-CM | POA: Insufficient documentation

## 2020-07-01 LAB — CBC WITH DIFFERENTIAL/PLATELET
Abs Immature Granulocytes: 0.04 10*3/uL (ref 0.00–0.07)
Basophils Absolute: 0.1 10*3/uL (ref 0.0–0.1)
Basophils Relative: 1 %
Eosinophils Absolute: 0.2 10*3/uL (ref 0.0–0.5)
Eosinophils Relative: 2 %
HCT: 48.1 % (ref 39.0–52.0)
Hemoglobin: 16.6 g/dL (ref 13.0–17.0)
Immature Granulocytes: 0 %
Lymphocytes Relative: 27 %
Lymphs Abs: 2.7 10*3/uL (ref 0.7–4.0)
MCH: 31.7 pg (ref 26.0–34.0)
MCHC: 34.5 g/dL (ref 30.0–36.0)
MCV: 92 fL (ref 80.0–100.0)
Monocytes Absolute: 0.8 10*3/uL (ref 0.1–1.0)
Monocytes Relative: 8 %
Neutro Abs: 6.2 10*3/uL (ref 1.7–7.7)
Neutrophils Relative %: 62 %
Platelets: 335 10*3/uL (ref 150–400)
RBC: 5.23 MIL/uL (ref 4.22–5.81)
RDW: 11.6 % (ref 11.5–15.5)
WBC: 10 10*3/uL (ref 4.0–10.5)
nRBC: 0 % (ref 0.0–0.2)

## 2020-07-01 LAB — COMPREHENSIVE METABOLIC PANEL
ALT: 32 U/L (ref 0–44)
AST: 25 U/L (ref 15–41)
Albumin: 3.9 g/dL (ref 3.5–5.0)
Alkaline Phosphatase: 62 U/L (ref 38–126)
Anion gap: 11 (ref 5–15)
BUN: 13 mg/dL (ref 6–20)
CO2: 24 mmol/L (ref 22–32)
Calcium: 9.3 mg/dL (ref 8.9–10.3)
Chloride: 103 mmol/L (ref 98–111)
Creatinine, Ser: 1.08 mg/dL (ref 0.61–1.24)
GFR, Estimated: >60 mL/min (ref >60)
Glucose, Bld: 164 mg/dL — ABNORMAL HIGH (ref 70–99)
Potassium: 3.6 mmol/L (ref 3.5–5.1)
Sodium: 138 mmol/L (ref 135–145)
Total Bilirubin: 0.9 mg/dL (ref 0.3–1.2)
Total Protein: 7.4 g/dL (ref 6.5–8.1)

## 2020-07-01 MED ORDER — LISINOPRIL 10 MG PO TABS
10.0000 mg | ORAL_TABLET | Freq: Every day | ORAL | 0 refills | Status: DC
Start: 2020-07-01 — End: 2020-08-04

## 2020-07-01 NOTE — ED Notes (Signed)
Pt reports his blood pressure medication makes him feel dizzy, makes his knees buckle and his eyes close and he falls down. He also reports bilateral ankle swelling. He states his left thigh has been numb for two months. Pt reports he needs blood pressure medication, but he can't continue to take the amlodipine because he will lose his job.

## 2020-07-01 NOTE — ED Provider Notes (Signed)
Emergency Department Provider Note  ____________________________________________  Time seen: Approximately 7:11 PM  I have reviewed the triage vital signs and the nursing notes.   HISTORY  Chief Complaint Medication Refill   Historian Patient     HPI Kirk Lyons is a 49 y.o. male presents to the emergency department for blood pressure medication adjustment.  Patient states that he does not wish to continue taking his amlodipine.  Patient states that he thinks amlodipine makes him twitch before he goes to sleep.  He denies current chest pain, chest tightness or shortness of breath.  He states that twitching has not occurred during the entire time patient has been taking amlodipine.  Patient states that he is here to switch medications.  He does not have a PCP.   Past Medical History:  Diagnosis Date  . Gout   . Hypertension      Immunizations up to date:  Yes.     Past Medical History:  Diagnosis Date  . Gout   . Hypertension     There are no problems to display for this patient.   Past Surgical History:  Procedure Laterality Date  . HERNIA REPAIR      Prior to Admission medications   Medication Sig Start Date End Date Taking? Authorizing Provider  albuterol (VENTOLIN HFA) 108 (90 Base) MCG/ACT inhaler Inhale 2 puffs into the lungs every 6 (six) hours as needed for wheezing or shortness of breath. 06/06/20   Chesley Noon, MD  allopurinol (ZYLOPRIM) 100 MG tablet Take 1 tablet (100 mg total) by mouth daily. 03/25/20 03/25/21  Cuthriell, Delorise Royals, PA-C  amLODipine (NORVASC) 5 MG tablet Take 1 tablet (5 mg total) by mouth daily. 03/06/20 03/06/21  Triplett, Rulon Eisenmenger B, FNP  colchicine 0.6 MG tablet Take 1 tablet (0.6 mg total) by mouth daily. Take 1 tab daily for at least 6 more days. If  Symptoms persist past 6 days continue to use until prescription is finished 03/25/20   Cuthriell, Christiane Ha D, PA-C  feeding supplement (ENSURE CLINICAL STRENGTH) LIQD Take 237 mLs by  mouth daily as needed (nutrition).    [provider]  HYDROcodone-acetaminophen (NORCO/VICODIN) 5-325 MG tablet Take 1 tablet by mouth every 4 (four) hours as needed for moderate pain. 03/25/20   Cuthriell, Delorise Royals, PA-C  lisinopril (ZESTRIL) 10 MG tablet Take 1 tablet (10 mg total) by mouth daily. 07/01/20 07/31/20  Orvil Feil, PA-C  Multiple Vitamins-Minerals (EMERGEN-C VITAMIN C) PACK Take 1 Package by mouth as needed (immune system support).    [provider]  traMADol (ULTRAM) 50 MG tablet Take 1 tablet (50 mg total) by mouth every 12 (twelve) hours as needed. 12/15/19   Joni Reining, PA-C  triamcinolone ointment (KENALOG) 0.5 % Apply 1 application topically 2 (two) times daily. 10/05/19   Tommi Rumps, PA-C    Allergies Erythromycin  History reviewed. No pertinent family history.  Social History Social History   Tobacco Use  . Smoking status: Current Some Day Smoker    Types: Cigarettes  . Smokeless tobacco: Never Used  Substance Use Topics  . Alcohol use: Yes    Alcohol/week: 3.0 standard drinks    Types: 3 Cans of beer per week    Comment: social  . Drug use: No     Review of Systems  Constitutional: No fever/chills Eyes:  No discharge ENT: No upper respiratory complaints. Respiratory: no cough. No SOB/ use of accessory muscles to breath Gastrointestinal:   No nausea, no  vomiting.  No diarrhea.  No constipation. Musculoskeletal: Negative for musculoskeletal pain. Skin: Negative for rash, abrasions, lacerations, ecchymosis.    ____________________________________________   PHYSICAL EXAM:  VITAL SIGNS: ED Triage Vitals  Enc Vitals Group     BP 07/01/20 1550 (!) 160/105     Pulse Rate 07/01/20 1550 (!) 105     Resp 07/01/20 1550 16     Temp 07/01/20 1550 98.2 F (36.8 C)     Temp Source 07/01/20 1550 Oral     SpO2 07/01/20 1550 96 %     Weight 07/01/20 1551 235 lb (106.6 kg)     Height 07/01/20 1551 6\' 2"  (1.88 m)     Head  Circumference --      Peak Flow --      Pain Score 07/01/20 1551 0     Pain Loc --      Pain Edu? --      Excl. in GC? --      Constitutional: Alert and oriented. Well appearing and in no acute distress. Eyes: Conjunctivae are normal. PERRL. EOMI. Head: Atraumatic. ENT:      Nose: No congestion/rhinnorhea.      Mouth/Throat: Mucous membranes are moist.  Neck: No stridor. FROM.  Hematological/Lymphatic/Immunilogical: No cervical lymphadenopathy.  Cardiovascular: Normal rate, regular rhythm. Normal S1 and S2.  Good peripheral circulation. Respiratory: Normal respiratory effort without tachypnea or retractions. Lungs CTAB. Good air entry to the bases with no decreased or absent breath sounds Gastrointestinal: Bowel sounds x 4 quadrants. Soft and nontender to palpation. No guarding or rigidity. No distention. Musculoskeletal: Full range of motion to all extremities. No obvious deformities noted Neurologic:  Normal for age. No gross focal neurologic deficits are appreciated.  Skin: Patient has 1+ pitting edema at the bilateral ankles. Psychiatric: Mood and affect are normal for age. Speech and behavior are normal.   ____________________________________________   LABS (all labs ordered are listed, but only abnormal results are displayed)  Labs Reviewed  COMPREHENSIVE METABOLIC PANEL - Abnormal; Notable for the following components:      Result Value   Glucose, Bld 164 (*)    All other components within normal limits  CBC WITH DIFFERENTIAL/PLATELET   ____________________________________________  EKG   ____________________________________________  RADIOLOGY   No results found.  ____________________________________________    PROCEDURES  Procedure(s) performed:     Procedures     Medications - No data to display   ____________________________________________   INITIAL IMPRESSION / ASSESSMENT AND PLAN / ED COURSE  Pertinent labs & imaging results that  were available during my care of the patient were reviewed by me and considered in my medical decision making (see chart for details).      Assessment and plan Medication management 50 year old male presents to the emergency department for medication management.  Patient was hypertensive and mildly tachycardic at triage but vital signs were otherwise reassuring.  Explained to patient that I did not think his twitching before sleep was a side effect from amlodipine.  Patient stated that he did not care and he wanted medication changed.  I prescribed lisinopril 10 mg daily to be used as needed for hypertension.  I recommended discontinuing amlodipine at this time.  Work note was given per his request.  All patient questions were answered.     ____________________________________________  FINAL CLINICAL IMPRESSION(S) / ED DIAGNOSES  Final diagnoses:  Medication management      NEW MEDICATIONS STARTED DURING THIS VISIT:  ED Discharge Orders  Ordered    lisinopril (ZESTRIL) 10 MG tablet  Daily        07/01/20 1908              This chart was dictated using voice recognition software/Dragon. Despite best efforts to proofread, errors can occur which can change the meaning. Any change was purely unintentional.     Orvil Feil, PA-C 07/01/20 1916    Gilles Chiquito, MD 07/01/20 [phone]

## 2020-07-01 NOTE — Discharge Instructions (Addendum)
Please discontinue amlodipine. You can take one 10 mg tablet of lisinopril daily for hypertension.

## 2020-07-01 NOTE — ED Triage Notes (Signed)
Patient here requesting another blood pressure medication to replace amlodipine. Reports he has been taking it a year and recently after taking the medication he has twitches and very fatigued. Also needs confirmation for his work he has been medically checked out

## 2020-07-01 NOTE — ED Notes (Signed)
Patient declined final set of vitals 

## 2020-08-04 ENCOUNTER — Emergency Department
Admission: EM | Admit: 2020-08-04 | Discharge: 2020-08-04 | Disposition: A | Discharge status: Home / Self Care | Payer: 59 | Attending: Emergency Medicine | Admitting: Emergency Medicine

## 2020-08-04 ENCOUNTER — Other Ambulatory Visit: Payer: Self-pay

## 2020-08-04 DIAGNOSIS — M1A0710 Idiopathic chronic gout, right ankle and foot, without tophus (tophi): Secondary | ICD-10-CM | POA: Insufficient documentation

## 2020-08-04 DIAGNOSIS — Z79899 Other long term (current) drug therapy: Secondary | ICD-10-CM | POA: Insufficient documentation

## 2020-08-04 DIAGNOSIS — M1A0720 Idiopathic chronic gout, left ankle and foot, without tophus (tophi): Secondary | ICD-10-CM | POA: Diagnosis not present

## 2020-08-04 DIAGNOSIS — M79671 Pain in right foot: Secondary | ICD-10-CM | POA: Diagnosis present

## 2020-08-04 DIAGNOSIS — I10 Essential (primary) hypertension: Secondary | ICD-10-CM | POA: Insufficient documentation

## 2020-08-04 DIAGNOSIS — F17210 Nicotine dependence, cigarettes, uncomplicated: Secondary | ICD-10-CM | POA: Insufficient documentation

## 2020-08-04 DIAGNOSIS — M1000 Idiopathic gout, unspecified site: Secondary | ICD-10-CM

## 2020-08-04 MED ORDER — LISINOPRIL 10 MG PO TABS: 10.0000 mg | ORAL_TABLET | Freq: Every day | ORAL | 5 refills | Status: DC

## 2020-08-04 MED ORDER — ALLOPURINOL 100 MG PO TABS: 100.0000 mg | ORAL_TABLET | Freq: Every day | ORAL | 5 refills | Status: DC

## 2020-08-04 MED ORDER — MELOXICAM 15 MG PO TABS
15.0000 mg | ORAL_TABLET | Freq: Every day | ORAL | 2 refills | Status: DC
Start: 2020-08-04 — End: 2021-06-09

## 2020-08-04 NOTE — Discharge Instructions (Signed)
Follow up with [facility] to establish a regular doctor Return to the er if worsening Use the medication as prescribed

## 2020-08-04 NOTE — ED Provider Notes (Signed)
[facility] Emergency Department Provider Note  ____________________________________________   Event Date/Time   First MD Initiated Contact with Patient 08/04/20 1322     (approximate)  I have reviewed the triage vital signs and the nursing notes.   HISTORY  Chief Complaint Ankle Pain    HPI VINICIO LYNK is a 50 y.o. male presents emergency department complaining of bilateral foot pain.  Patient states he has a history of gout.  He has not taken his allopurinol in approximately 1 week.  States that he works at [employer] and stands most of the day.  States its been unbearable last couple of days.  He denies any injury or swelling.    Past Medical History:  Diagnosis Date  . Gout   . Hypertension     There are no problems to display for this patient.   Past Surgical History:  Procedure Laterality Date  . HERNIA REPAIR      Prior to Admission medications   Medication Sig Start Date End Date Taking? Authorizing Provider  meloxicam (MOBIC) 15 MG tablet Take 1 tablet (15 mg total) by mouth daily. 08/04/20 08/04/21 Yes Kymberlie Brazeau, Roselyn Bering, PA-C  albuterol (VENTOLIN HFA) 108 (90 Base) MCG/ACT inhaler Inhale 2 puffs into the lungs every 6 (six) hours as needed for wheezing or shortness of breath. 06/06/20   Chesley Noon, MD  allopurinol (ZYLOPRIM) 100 MG tablet Take 1 tablet (100 mg total) by mouth daily. 08/04/20 08/04/21  Yomara Toothman, Roselyn Bering, PA-C  feeding supplement (ENSURE CLINICAL STRENGTH) LIQD Take 237 mLs by mouth daily as needed (nutrition).    [provider]  lisinopril (ZESTRIL) 10 MG tablet Take 1 tablet (10 mg total) by mouth daily. 08/04/20 09/03/20  Sherrie Mustache Roselyn Bering, PA-C  Multiple Vitamins-Minerals (EMERGEN-C VITAMIN C) PACK Take 1 Package by mouth as needed (immune system support).    [provider]  triamcinolone ointment (KENALOG) 0.5 % Apply 1 application topically 2 (two) times daily. 10/05/19   Tommi Rumps, PA-C     Allergies Erythromycin  No family history on file.  Social History Social History   Tobacco Use  . Smoking status: Current Some Day Smoker    Types: Cigarettes  . Smokeless tobacco: Never Used  Substance Use Topics  . Alcohol use: Yes    Alcohol/week: 3.0 standard drinks    Types: 3 Cans of beer per week    Comment: social  . Drug use: No    Review of Systems  Constitutional: No fever/chills Eyes: No visual changes. ENT: No sore throat. Respiratory: Denies cough Cardiovascular: Denies chest pain Gastrointestinal: Denies abdominal pain Genitourinary: Negative for dysuria. Musculoskeletal: Negative for back pain.  Positive bilateral foot pain Skin: Negative for rash. Psychiatric: no mood changes,     ____________________________________________   PHYSICAL EXAM:  VITAL SIGNS: ED Triage Vitals  Enc Vitals Group     BP 08/04/20 1019 (!) 175/110     Pulse Rate 08/04/20 1019 (!) 105     Resp 08/04/20 1019 17     Temp 08/04/20 1019 98.5 F (36.9 C)     Temp Source 08/04/20 1019 Oral     SpO2 08/04/20 1019 96 %     Weight 08/04/20 1017 244 lb (110.7 kg)     Height 08/04/20 1017 6\' 2"  (1.88 m)     Head Circumference --      Peak Flow --      Pain Score 08/04/20 1016 7     Pain  Loc --      Pain Edu? --      Excl. in GC? --     Constitutional: Alert and oriented. Well appearing and in no acute distress. Eyes: Conjunctivae are normal.  Head: Atraumatic. Nose: No congestion/rhinnorhea. Mouth/Throat: Mucous membranes are moist.  Neck:  supple no lymphadenopathy noted Cardiovascular: Normal rate, regular rhythm. Heart sounds are normal Respiratory: Normal respiratory effort.  No retractions, lungs c t a  GU: deferred Musculoskeletal: FROM all extremities, warm and well perfused, both feet appear to be normal, no redness or swelling noted, neurovascular intact Neurologic:  Normal speech and language.  Skin:  Skin is warm, dry and intact. No rash  noted. Psychiatric: Mood and affect are normal. Speech and behavior are normal.  ____________________________________________   LABS (all labs ordered are listed, but only abnormal results are displayed)  Labs Reviewed - No data to display ____________________________________________   ____________________________________________  RADIOLOGY    ____________________________________________   PROCEDURES  Procedure(s) performed: No  Procedures    ____________________________________________   INITIAL IMPRESSION / ASSESSMENT AND PLAN / ED COURSE  Pertinent labs & imaging results that were available during my care of the patient were reviewed by me and considered in my medical decision making (see chart for details).   Patient's 50 year old male presents bilateral foot pain.  See HPI.  Physical exam is unremarkable.  I did discuss doing lab work to assess for gout.  The patient at this time would just like to have his allopurinol refilled along with a anti-inflammatory.  States that he does not feel like he needs blood work at this time.  He will follow-up with his regular doctor.  He was discharged stable condition.     MIQUEL STACKS was evaluated in Emergency Department on 08/04/2020 for the symptoms described in the history of present illness. He was evaluated in the context of the global COVID-19 pandemic, which necessitated consideration that the patient might be at risk for infection with the SARS-CoV-2 virus that causes COVID-19. Institutional protocols and algorithms that pertain to the evaluation of patients at risk for COVID-19 are in a state of rapid change based on information released by regulatory bodies including the CDC and federal and state organizations. These policies and algorithms were followed during the patient's care in the ED.    As part of my medical decision making, I reviewed the following data within the electronic MEDICAL RECORD NUMBER Nursing notes  reviewed and incorporated, Old chart reviewed, Notes from prior ED visits and [place] Controlled Substance Database  ____________________________________________   FINAL CLINICAL IMPRESSION(S) / ED DIAGNOSES  Final diagnoses:  Idiopathic gout, unspecified chronicity, unspecified site      NEW MEDICATIONS STARTED DURING THIS VISIT:  Discharge Medication List as of 08/04/2020  1:37 PM    START taking these medications   Details  meloxicam (MOBIC) 15 MG tablet Take 1 tablet (15 mg total) by mouth daily., Starting Wed 08/04/2020, Until Thu 08/04/2021, Normal         Note:  This document was prepared using Dragon voice recognition software and may include unintentional dictation errors.    Faythe Ghee, PA-C 08/04/20 1543    Merwyn Katos, MD 08/05/20 [phone]

## 2020-08-04 NOTE — ED Triage Notes (Signed)
Pt c/o BL ankle pain for the past 4 days, states he works on his feet a lot and has a hx of gout but never in the ankles.

## 2020-08-04 NOTE — ED Notes (Signed)
See triage note  Presents with pain to both feet  States has had some swelling to ankle with standing    Hx of gout  Pain is increased with standing at work   States he is not able to work

## 2020-08-07 ENCOUNTER — Emergency Department
Admission: EM | Admit: 2020-08-07 | Discharge: 2020-08-07 | Disposition: A | Discharge status: Home / Self Care | Payer: 59 | Attending: Emergency Medicine | Admitting: Emergency Medicine

## 2020-08-07 ENCOUNTER — Emergency Department: Payer: 59

## 2020-08-07 ENCOUNTER — Other Ambulatory Visit: Payer: Self-pay

## 2020-08-07 ENCOUNTER — Encounter: Payer: Self-pay | Admitting: Intensive Care

## 2020-08-07 DIAGNOSIS — M25572 Pain in left ankle and joints of left foot: Secondary | ICD-10-CM | POA: Insufficient documentation

## 2020-08-07 DIAGNOSIS — I10 Essential (primary) hypertension: Secondary | ICD-10-CM | POA: Insufficient documentation

## 2020-08-07 DIAGNOSIS — Z79899 Other long term (current) drug therapy: Secondary | ICD-10-CM | POA: Diagnosis not present

## 2020-08-07 DIAGNOSIS — M79672 Pain in left foot: Secondary | ICD-10-CM | POA: Diagnosis not present

## 2020-08-07 DIAGNOSIS — R609 Edema, unspecified: Secondary | ICD-10-CM | POA: Diagnosis not present

## 2020-08-07 DIAGNOSIS — F17210 Nicotine dependence, cigarettes, uncomplicated: Secondary | ICD-10-CM | POA: Diagnosis not present

## 2020-08-07 LAB — COMPREHENSIVE METABOLIC PANEL
ALT: 35 U/L (ref 0–44)
AST: 23 U/L (ref 15–41)
Albumin: 4.2 g/dL (ref 3.5–5.0)
Alkaline Phosphatase: 64 U/L (ref 38–126)
Anion gap: 12 (ref 5–15)
BUN: 16 mg/dL (ref 6–20)
CO2: 24 mmol/L (ref 22–32)
Calcium: 9.4 mg/dL (ref 8.9–10.3)
Chloride: 103 mmol/L (ref 98–111)
Creatinine, Ser: 0.93 mg/dL (ref 0.61–1.24)
GFR, Estimated: >60 mL/min (ref >60)
Glucose, Bld: 143 mg/dL — ABNORMAL HIGH (ref 70–99)
Potassium: 4.2 mmol/L (ref 3.5–5.1)
Sodium: 139 mmol/L (ref 135–145)
Total Bilirubin: 0.9 mg/dL (ref 0.3–1.2)
Total Protein: 7.9 g/dL (ref 6.5–8.1)

## 2020-08-07 LAB — CBC WITH DIFFERENTIAL/PLATELET
Abs Immature Granulocytes: 0.05 10*3/uL (ref 0.00–0.07)
Basophils Absolute: 0.1 10*3/uL (ref 0.0–0.1)
Basophils Relative: 1 %
Eosinophils Absolute: 0.2 10*3/uL (ref 0.0–0.5)
Eosinophils Relative: 2 %
HCT: 48.3 % (ref 39.0–52.0)
Hemoglobin: 16.5 g/dL (ref 13.0–17.0)
Immature Granulocytes: 1 %
Lymphocytes Relative: 23 %
Lymphs Abs: 2.5 10*3/uL (ref 0.7–4.0)
MCH: 31.8 pg (ref 26.0–34.0)
MCHC: 34.2 g/dL (ref 30.0–36.0)
MCV: 93.1 fL (ref 80.0–100.0)
Monocytes Absolute: 0.9 10*3/uL (ref 0.1–1.0)
Monocytes Relative: 8 %
Neutro Abs: 7.2 10*3/uL (ref 1.7–7.7)
Neutrophils Relative %: 65 %
Platelets: 281 10*3/uL (ref 150–400)
RBC: 5.19 MIL/uL (ref 4.22–5.81)
RDW: 11.9 % (ref 11.5–15.5)
WBC: 11 10*3/uL — ABNORMAL HIGH (ref 4.0–10.5)
nRBC: 0 % (ref 0.0–0.2)

## 2020-08-07 LAB — URIC ACID: Uric Acid, Serum: 8 mg/dL (ref 3.7–8.6)

## 2020-08-07 MED ORDER — KETOROLAC TROMETHAMINE 30 MG/ML IJ SOLN
30.0000 mg | Freq: Once | INTRAMUSCULAR | Status: AC
  Administered 2020-08-07: 30 mg via INTRAMUSCULAR
  Filled 2020-08-07: qty 1

## 2020-08-07 MED ORDER — PREDNISONE 10 MG (21) PO TBPK: ORAL_TABLET | ORAL | 0 refills | Status: DC

## 2020-08-07 MED ORDER — PREDNISONE 20 MG PO TABS
60.0000 mg | ORAL_TABLET | Freq: Once | ORAL | Status: AC
  Administered 2020-08-07: 60 mg via ORAL
  Filled 2020-08-07: qty 3

## 2020-08-07 NOTE — ED Triage Notes (Signed)
Patient c/o left foot pain (gout) and left knee pain. Patient out of blood pressure medication and gout medication, Reports he has no money to get it filled. Able to ambulate with pain.

## 2020-08-07 NOTE — ED Notes (Signed)
Pt refused VS before discharge. Pt upset with ED provider.

## 2020-08-07 NOTE — ED Provider Notes (Addendum)
[facility]-EMERGENCY DEPARTMENT  ____________________________________________  Time seen: Approximately 4:15 PM  I have reviewed the triage vital signs and the nursing notes.   HISTORY  Chief Complaint Knee Pain and Foot Pain   Historian Patient    HPI Kirk Lyons is a 50 y.o. male presents to the emergency department with concern for left foot pain and ankle pain.  Patient states that he has history of gout and normally has gout in his right toe.  He states that he has been prescribed allopurinol and colchicine but cannot currently afford it.  He is looking for affordable pain medications until he can pick up his normal gout medicines.  He denies fever and chills at home.  No erythema or swelling of the left calf.  Patient apologizes during history as he states that he just had dreams about tornadoes and states that he has not been sleeping well at night.  Past Medical History:  Diagnosis Date  . Gout   . Hypertension      Immunizations up to date:  Yes.     Past Medical History:  Diagnosis Date  . Gout   . Hypertension     There are no problems to display for this patient.   Past Surgical History:  Procedure Laterality Date  . HERNIA REPAIR      Prior to Admission medications   Medication Sig Start Date End Date Taking? Authorizing Provider  predniSONE (STERAPRED UNI-PAK 21 TAB) 10 MG (21) TBPK tablet Take 6 tablets the first day, take 5 tablets the second day, take 4 tablets the third day, take 3 tablets the fourth day, take 2 tablets the fifth day, take 1 tablet the sixth day. 08/07/20  Yes Pia Mau M, PA-C  albuterol (VENTOLIN HFA) 108 (90 Base) MCG/ACT inhaler Inhale 2 puffs into the lungs every 6 (six) hours as needed for wheezing or shortness of breath. 06/06/20   Chesley Noon, MD  allopurinol (ZYLOPRIM) 100 MG tablet Take 1 tablet (100 mg total) by mouth daily. 08/04/20 08/04/21  Fisher, Roselyn Bering, PA-C  feeding supplement (ENSURE CLINICAL STRENGTH) LIQD  Take 237 mLs by mouth daily as needed (nutrition).    [provider]  lisinopril (ZESTRIL) 10 MG tablet Take 1 tablet (10 mg total) by mouth daily. 08/04/20 09/03/20  Fisher, Roselyn Bering, PA-C  meloxicam (MOBIC) 15 MG tablet Take 1 tablet (15 mg total) by mouth daily. 08/04/20 08/04/21  Sherrie Mustache Roselyn Bering, PA-C  Multiple Vitamins-Minerals (EMERGEN-C VITAMIN C) PACK Take 1 Package by mouth as needed (immune system support).    [provider]  triamcinolone ointment (KENALOG) 0.5 % Apply 1 application topically 2 (two) times daily. 10/05/19   Tommi Rumps, PA-C    Allergies Erythromycin  History reviewed. No pertinent family history.  Social History Social History   Tobacco Use  . Smoking status: Current Some Day Smoker    Types: Cigarettes  . Smokeless tobacco: Never Used  Substance Use Topics  . Alcohol use: Yes    Alcohol/week: 3.0 standard drinks    Types: 3 Cans of beer per week    Comment: social  . Drug use: No     Review of Systems  Constitutional: No fever/chills Eyes:  No discharge ENT: No upper respiratory complaints. Respiratory: no cough. No SOB/ use of accessory muscles to breath Gastrointestinal:   No nausea, no vomiting.  No diarrhea.  No constipation. Musculoskeletal: Patient has left ankle and left foot pain.  Skin: Negative for rash, abrasions, lacerations,  ecchymosis.    ____________________________________________   PHYSICAL EXAM:  VITAL SIGNS: ED Triage Vitals  Enc Vitals Group     BP 08/07/20 1301 (!) 175/126     Pulse Rate 08/07/20 1301 100     Resp 08/07/20 1301 16     Temp 08/07/20 1301 97.8 F (36.6 C)     Temp Source 08/07/20 1301 Oral     SpO2 08/07/20 1301 97 %     Weight 08/07/20 1321 244 lb (110.7 kg)     Height 08/07/20 1321 6\' 2"  (1.88 m)     Head Circumference --      Peak Flow --      Pain Score 08/07/20 1321 10     Pain Loc --      Pain Edu? --      Excl. in GC? --      Constitutional: Alert and oriented.  Well appearing and in no acute distress. Eyes: Conjunctivae are normal. PERRL. EOMI. Head: Atraumatic. Cardiovascular: Normal rate, regular rhythm. Normal S1 and S2.  Good peripheral circulation. Respiratory: Normal respiratory effort without tachypnea or retractions. Lungs CTAB. Good air entry to the bases with no decreased or absent breath sounds Gastrointestinal: Bowel sounds x 4 quadrants. Soft and nontender to palpation. No guarding or rigidity. No distention. Musculoskeletal: Full range of motion to all extremities. No obvious deformities noted palpable dorsalis pedis pulse bilaterally and symmetrically. Neurologic:  Normal for age. No gross focal neurologic deficits are appreciated.  Skin:  Skin is warm, dry and intact. No rash noted. Psychiatric: Mood and affect are normal for age. Speech and behavior are normal.   ____________________________________________   LABS (all labs ordered are listed, but only abnormal results are displayed)  Labs Reviewed  COMPREHENSIVE METABOLIC PANEL - Abnormal; Notable for the following components:      Result Value   Glucose, Bld 143 (*)    All other components within normal limits  CBC WITH DIFFERENTIAL/PLATELET - Abnormal; Notable for the following components:   WBC 11.0 (*)    All other components within normal limits  URIC ACID   ____________________________________________  EKG   ____________________________________________  RADIOLOGY 08/09/20, personally viewed and evaluated these images (plain radiographs) as part of my medical decision making, as well as reviewing the written report by the radiologist.  DG Foot Complete Left  Result Date: 08/07/2020 CLINICAL DATA:  Left foot pain and swelling. EXAM: LEFT FOOT - COMPLETE 3+ VIEW COMPARISON:  None. FINDINGS: There is no evidence of fracture or dislocation. Small plantar calcaneal spur is noted. Soft tissues are unremarkable. IMPRESSION: No acute abnormality. Electronically  Signed   By: 08/09/2020 M.D.   On: 08/07/2020 13:50    ____________________________________________    PROCEDURES  Procedure(s) performed:     Procedures     Medications  predniSONE (DELTASONE) tablet 60 mg (60 mg Oral Given 08/07/20 1610)  ketorolac (TORADOL) 30 MG/ML injection 30 mg (30 mg Intramuscular Given 08/07/20 1611)     ____________________________________________   INITIAL IMPRESSION / ASSESSMENT AND PLAN / ED COURSE  Pertinent labs & imaging results that were available during my care of the patient were reviewed by me and considered in my medical decision making (see chart for details).      Assessment and plan Left ankle pain Left foot pain 50 year old male presents to the emergency department with left foot pain and ankle pain.  CBC and CMP were reassuring.  Uric acid level was within reference range.  X-ray of the left foot reveals no acute bony abnormality.   Nursing staff reviewed commentary from radiologist about calcaneal spur on left foot x-ray.  Patient became very upset and stated that his dad had a calcaneal spurs and he wanted to know if this was the source of his chronic feet pain.  Explained to patient that I still felt like his gout was more likely a source of his discomfort given his history. However, his current management plan would cover him for both gout pain and calcaneal spur pain.   Patient declined referral to podiatry.    Patient was started on prednisone in the emergency department and discharged with a prednisone taper.  He was given an injection of Toradol prior to discharge.  All patient questions were answered.  Patient remained very angry throughout emergency department course and was unhappy with the care he received despite two attempts to alleviate patient's concerns.    ____________________________________________  FINAL CLINICAL IMPRESSION(S) / ED DIAGNOSES  Final diagnoses:  Swelling  Acute left ankle pain       NEW MEDICATIONS STARTED DURING THIS VISIT:  ED Discharge Orders         Ordered    predniSONE (STERAPRED UNI-PAK 21 TAB) 10 MG (21) TBPK tablet        08/07/20 1600              This chart was dictated using voice recognition software/Dragon. Despite best efforts to proofread, errors can occur which can change the meaning. Any change was purely unintentional.     Orvil Feil, PA-C 08/07/20 1621    Pia Mau [place], PA-C 08/07/20 1705    Dionne Bucy, MD 08/07/20 1932

## 2020-08-07 NOTE — Discharge Instructions (Signed)
Take tapered steroid as directed.  

## 2020-11-15 ENCOUNTER — Emergency Department: Payer: Self-pay

## 2020-11-15 ENCOUNTER — Other Ambulatory Visit: Payer: Self-pay

## 2020-11-15 ENCOUNTER — Emergency Department
Admission: EM | Admit: 2020-11-15 | Discharge: 2020-11-15 | Disposition: A | Discharge status: Home / Self Care | Payer: Self-pay | Attending: Emergency Medicine | Admitting: Emergency Medicine

## 2020-11-15 DIAGNOSIS — Z79899 Other long term (current) drug therapy: Secondary | ICD-10-CM | POA: Insufficient documentation

## 2020-11-15 DIAGNOSIS — M10072 Idiopathic gout, left ankle and foot: Secondary | ICD-10-CM | POA: Insufficient documentation

## 2020-11-15 DIAGNOSIS — M109 Gout, unspecified: Secondary | ICD-10-CM

## 2020-11-15 DIAGNOSIS — F17210 Nicotine dependence, cigarettes, uncomplicated: Secondary | ICD-10-CM | POA: Insufficient documentation

## 2020-11-15 DIAGNOSIS — I10 Essential (primary) hypertension: Secondary | ICD-10-CM | POA: Insufficient documentation

## 2020-11-15 MED ORDER — IBUPROFEN 800 MG PO TABS: 800.0000 mg | ORAL_TABLET | Freq: Three times a day (TID) | ORAL | 0 refills | Status: DC | PRN

## 2020-11-15 MED ORDER — OXYCODONE-ACETAMINOPHEN 5-325 MG PO TABS: 1.0000 | ORAL_TABLET | ORAL | 0 refills | Status: DC | PRN

## 2020-11-15 MED ORDER — OXYCODONE-ACETAMINOPHEN 5-325 MG PO TABS
1.0000 | ORAL_TABLET | Freq: Once | ORAL | Status: AC
Start: 2020-11-15 — End: 2020-11-15
  Administered 2020-11-15: 1 via ORAL
  Filled 2020-11-15: qty 1

## 2020-11-15 MED ORDER — KETOROLAC TROMETHAMINE 60 MG/2ML IM SOLN
30.0000 mg | Freq: Once | INTRAMUSCULAR | Status: AC
  Administered 2020-11-15: 30 mg via INTRAMUSCULAR
  Filled 2020-11-15: qty 2

## 2020-11-15 NOTE — ED Provider Notes (Incomplete)
[facility] Emergency Department Provider Note   ____________________________________________   Event Date/Time   First MD Initiated Contact with Patient 11/15/20 [phone]     (approximate)  I have reviewed the triage vital signs and the nursing notes.   HISTORY  Chief Complaint Foot Pain    HPI Kirk Lyons is a 50 y.o. male who presents to the ED from home with a chief complaint of nontraumatic left foot pain.  Patient with a history of gout, mainly in his right great toe but who has been seen in the ED twice in the past several months for left foot pain secondary to gout.  Reports a 4-day history of left lateral foot/ankle pain which extends to his heel.  Denies calf pain.  Also notes foot swelling.  States at times the foot looks purple.  Also pain to the ball of his foot on stepping.  Denies fall/injury/trauma.  Cannot afford his allopurinol or colchicine.  Denies fever, chills,        {**SYMPTOM/COMPLAINT  LOCATION (describe anatomically) DURATION (when did it start) TIMING (onset and pattern) SEVERITY (0-10, mild/moderate/severe) QUALITY (description of symptoms) CONTEXT (recent surgery, new meds, activity, etc.) MODIFYINGFACTORS (what makes it better/worse) ASSOCIATEDSYMPTOMS (pertinent positives and negatives)**} Past Medical History:  Diagnosis Date  . Gout   . Hypertension     There are no problems to display for this patient.   Past Surgical History:  Procedure Laterality Date  . HERNIA REPAIR      Prior to Admission medications   Medication Sig Start Date End Date Taking? Authorizing Provider  albuterol (VENTOLIN HFA) 108 (90 Base) MCG/ACT inhaler Inhale 2 puffs into the lungs every 6 (six) hours as needed for wheezing or shortness of breath. 06/06/20   Chesley Noon, MD  allopurinol (ZYLOPRIM) 100 MG tablet Take 1 tablet (100 mg total) by mouth daily. 08/04/20 08/04/21  Fisher, Roselyn Bering, PA-C  feeding supplement (ENSURE  CLINICAL STRENGTH) LIQD Take 237 mLs by mouth daily as needed (nutrition).    [provider]  lisinopril (ZESTRIL) 10 MG tablet Take 1 tablet (10 mg total) by mouth daily. 08/04/20 09/03/20  Fisher, Roselyn Bering, PA-C  meloxicam (MOBIC) 15 MG tablet Take 1 tablet (15 mg total) by mouth daily. 08/04/20 08/04/21  Sherrie Mustache Roselyn Bering, PA-C  Multiple Vitamins-Minerals (EMERGEN-C VITAMIN C) PACK Take 1 Package by mouth as needed (immune system support).    [provider]  predniSONE (STERAPRED UNI-PAK 21 TAB) 10 MG (21) TBPK tablet Take 6 tablets the first day, take 5 tablets the second day, take 4 tablets the third day, take 3 tablets the fourth day, take 2 tablets the fifth day, take 1 tablet the sixth day. 08/07/20   Orvil Feil, PA-C  triamcinolone ointment (KENALOG) 0.5 % Apply 1 application topically 2 (two) times daily. 10/05/19   Tommi Rumps, PA-C    Allergies Erythromycin  No family history on file.  Social History Social History   Tobacco Use  . Smoking status: Current Some Day Smoker    Types: Cigarettes  . Smokeless tobacco: Never Used  Substance Use Topics  . Alcohol use: Yes    Alcohol/week: 3.0 standard drinks    Types: 3 Cans of beer per week    Comment: social  . Drug use: No    Review of Systems {** Revise as appropriate then delete this line - Documentation of 10 systems is required  **} Constitutional: No fever/chills Eyes: No visual changes. ENT:  No sore throat. Cardiovascular: Denies chest pain. Respiratory: Denies shortness of breath. Gastrointestinal: No abdominal pain.  No nausea, no vomiting.  No diarrhea.  No constipation. Genitourinary: Negative for dysuria. Musculoskeletal: Negative for back pain. Skin: Negative for rash. Neurological: Negative for headaches, focal weakness or numbness. {**Psychiatric:  Endocrine:  Hematological/Lymphatic:  Allergic/Immunilogical: **}  ____________________________________________   PHYSICAL  EXAM:  VITAL SIGNS: ED Triage Vitals [11/15/20 0205]  Enc Vitals Group     BP (!) 143/110     Pulse Rate (!) 111     Resp 16     Temp 97.9 F (36.6 C)     Temp Source Oral     SpO2 100 %     Weight 245 lb (111.1 kg)     Height 6\' 2"  (1.88 m)     Head Circumference      Peak Flow      Pain Score 7     Pain Loc      Pain Edu?      Excl. in GC?    {** Revise as appropriate then delete this line - 8 systems required **} Constitutional: Alert and oriented. Well appearing and in no acute distress. Eyes: Conjunctivae are normal. PERRL. EOMI. Head: Atraumatic. Nose: No congestion/rhinnorhea. Mouth/Throat: Mucous membranes are moist.  Oropharynx non-erythematous. Neck: No stridor.  {**No cervical spine tenderness to palpation.**} {**Hematological/Lymphatic/Immunilogical: No cervical lymphadenopathy. **}Cardiovascular: Normal rate, regular rhythm. Grossly normal heart sounds.  Good peripheral circulation. Respiratory: Normal respiratory effort.  No retractions. Lungs CTAB. Gastrointestinal: Soft and nontender. No distention. No abdominal bruits. No CVA tenderness. {**Genitourinary:  **}Musculoskeletal: No lower extremity tenderness nor edema.  No joint effusions. Neurologic:  Normal speech and language. No gross focal neurologic deficits are appreciated. No gait instability. Skin:  Skin is warm, dry and intact. No rash noted. Psychiatric: Mood and affect are normal. Speech and behavior are normal.  ____________________________________________   LABS (all labs ordered are listed, but only abnormal results are displayed)  Labs Reviewed - No data to display ____________________________________________  EKG  *** ____________________________________________  RADIOLOGY I, Audel Coakley J, personally viewed and evaluated these images (plain radiographs) as part of my medical decision making, as well as reviewing the written report by the radiologist.  ED MD interpretation:   ***  Official radiology report(s): No results found.  ____________________________________________   PROCEDURES  Procedure(s) performed (including Critical Care):  Procedures   ____________________________________________   INITIAL IMPRESSION / ASSESSMENT AND PLAN / ED COURSE  As part of my medical decision making, I reviewed the following data within the electronic MEDICAL RECORD NUMBER {[id number]::"Notes from prior ED visits","[place] Controlled Substance Database"}        ***      ____________________________________________   FINAL CLINICAL IMPRESSION(S) / ED DIAGNOSES  Final diagnoses:  None     ED Discharge Orders    None      *Please note:  JEPTHA HINNENKAMP was evaluated in Emergency Department on 11/15/2020 for the symptoms described in the history of present illness. He was evaluated in the context of the global COVID-19 pandemic, which necessitated consideration that the patient might be at risk for infection with the SARS-CoV-2 virus that causes COVID-19. Institutional protocols and algorithms that pertain to the evaluation of patients at risk for COVID-19 are in a state of rapid change based on information released by regulatory bodies including the CDC and federal and state organizations. These policies and algorithms were followed during the patient's care in the ED.  Some  ED evaluations and interventions may be delayed as a result of limited staffing during and the pandemic.*   Note:  This document was prepared using Dragon voice recognition software and may include unintentional dictation errors.

## 2020-11-15 NOTE — ED Provider Notes (Signed)
[facility] Emergency Department Provider Note   ____________________________________________   Event Date/Time   First MD Initiated Contact with Patient 11/15/20 [phone]     (approximate)  I have reviewed the triage vital signs and the nursing notes.   HISTORY  Chief Complaint Foot Pain    HPI Kirk Lyons is a 50 y.o. male who presents to the ED from home with a chief complaint of nontraumatic left foot pain.  Patient with a history of gout, mainly in his right great toe but who has been seen in the ED twice in the past several months for left foot pain secondary to gout.  Reports a 4-day history of left lateral foot/ankle pain which extends to his heel.  Denies calf pain.  Also notes foot swelling.  States at times the foot looks purple.  Also pain to the ball of his foot on stepping.  Denies fall/injury/trauma.  Cannot afford his allopurinol or colchicine.  Denies fever, chills, chest pain, shortness of breath, abdominal pain, nausea, vomiting or dizziness.  Denies recent travel or hormone use.     Past Medical History:  Diagnosis Date  . Gout   . Hypertension     There are no problems to display for this patient.   Past Surgical History:  Procedure Laterality Date  . HERNIA REPAIR      Prior to Admission medications   Medication Sig Start Date End Date Taking? Authorizing Provider  ibuprofen (ADVIL) 800 MG tablet Take 1 tablet (800 mg total) by mouth every 8 (eight) hours as needed for moderate pain. 11/15/20  Yes Irean Hong, MD  oxyCODONE-acetaminophen (PERCOCET/ROXICET) 5-325 MG tablet Take 1 tablet by mouth every 4 (four) hours as needed for severe pain. 11/15/20  Yes Irean Hong, MD  albuterol (VENTOLIN HFA) 108 (90 Base) MCG/ACT inhaler Inhale 2 puffs into the lungs every 6 (six) hours as needed for wheezing or shortness of breath. 06/06/20   Chesley Noon, MD  allopurinol (ZYLOPRIM) 100 MG tablet Take 1 tablet (100 mg total) by mouth  daily. 08/04/20 08/04/21  Fisher, Roselyn Bering, PA-C  feeding supplement (ENSURE CLINICAL STRENGTH) LIQD Take 237 mLs by mouth daily as needed (nutrition).    [provider]  lisinopril (ZESTRIL) 10 MG tablet Take 1 tablet (10 mg total) by mouth daily. 08/04/20 09/03/20  Fisher, Roselyn Bering, PA-C  meloxicam (MOBIC) 15 MG tablet Take 1 tablet (15 mg total) by mouth daily. 08/04/20 08/04/21  Sherrie Mustache Roselyn Bering, PA-C  Multiple Vitamins-Minerals (EMERGEN-C VITAMIN C) PACK Take 1 Package by mouth as needed (immune system support).    [provider]  predniSONE (STERAPRED UNI-PAK 21 TAB) 10 MG (21) TBPK tablet Take 6 tablets the first day, take 5 tablets the second day, take 4 tablets the third day, take 3 tablets the fourth day, take 2 tablets the fifth day, take 1 tablet the sixth day. 08/07/20   Orvil Feil, PA-C  triamcinolone ointment (KENALOG) 0.5 % Apply 1 application topically 2 (two) times daily. 10/05/19   Tommi Rumps, PA-C    Allergies Erythromycin  No family history on file.  Social History Social History   Tobacco Use  . Smoking status: Current Some Day Smoker    Types: Cigarettes  . Smokeless tobacco: Never Used  Substance Use Topics  . Alcohol use: Yes    Alcohol/week: 3.0 standard drinks    Types: 3 Cans of beer per week    Comment: social  .  Drug use: No    Review of Systems  Constitutional: No fever/chills Eyes: No visual changes. ENT: No sore throat. Cardiovascular: Denies chest pain. Respiratory: Denies shortness of breath. Gastrointestinal: No abdominal pain.  No nausea, no vomiting.  No diarrhea.  No constipation. Genitourinary: Negative for dysuria. Musculoskeletal: Positive for left foot pain.  Negative for back pain. Skin: Negative for rash. Neurological: Negative for headaches, focal weakness or numbness.   ____________________________________________   PHYSICAL EXAM:  VITAL SIGNS: ED Triage Vitals [11/15/20 0205]  Enc Vitals Group      BP (!) 143/110     Pulse Rate (!) 111     Resp 16     Temp 97.9 F (36.6 C)     Temp Source Oral     SpO2 100 %     Weight 245 lb (111.1 kg)     Height 6\' 2"  (1.88 m)     Head Circumference      Peak Flow      Pain Score 7     Pain Loc      Pain Edu?      Excl. in GC?     Constitutional: Alert and oriented. Well appearing and in no acute distress. Eyes: Conjunctivae are normal. PERRL. EOMI. Head: Atraumatic. Nose: No congestion/rhinnorhea. Mouth/Throat: Mucous membranes are moist.   Neck: No stridor.   Cardiovascular: Normal rate, regular rhythm. Grossly normal heart sounds.  Good peripheral circulation. Respiratory: Normal respiratory effort.  No retractions. Lungs CTAB. Gastrointestinal: Soft and nontender. No distention. No abdominal bruits. No CVA tenderness. Musculoskeletal:  LLE: Mild swelling to lateral foot and ankle.  Full range of motion with some pain.  Warmth without erythema or streaking.  No calf tenderness.  Negative Homans' sign.  Symmetrically warm limb without evidence for ischemia.  2+ palpable distal pulses.  Brisk, less than 5-second capillary refill. Neurologic:  Normal speech and language. No gross focal neurologic deficits are appreciated.  Skin:  Skin is warm, dry and intact. No rash noted. Psychiatric: Mood and affect are normal. Speech and behavior are normal.  ____________________________________________   LABS (all labs ordered are listed, but only abnormal results are displayed)  Labs Reviewed - No data to display ____________________________________________  EKG  None ____________________________________________  RADIOLOGY I, Tonie Vizcarrondo J, personally viewed and evaluated these images (plain radiographs) as part of my medical decision making, as well as reviewing the written report by the radiologist.  ED MD interpretation: Soft tissue swelling noted  Official radiology report(s): DG Ankle Complete Left  Result Date: 11/15/2020 CLINICAL  DATA:  Left foot pain and swelling. EXAM: LEFT ANKLE COMPLETE - 3+ VIEW COMPARISON:  November 10, 2019 FINDINGS: There is no evidence of an acute fracture, dislocation, or joint effusion. There is no evidence of arthropathy or other focal bone abnormality. Mild lateral soft tissue swelling is seen. IMPRESSION: Lateral soft tissue swelling without an acute osseous abnormality. Electronically Signed   By: November 12, 2019 M.D.   On: 11/15/2020 03:18   DG Foot Complete Left  Result Date: 11/15/2020 CLINICAL DATA:  Left foot pain and swelling. EXAM: LEFT FOOT - COMPLETE 3+ VIEW COMPARISON:  None. FINDINGS: There is no evidence of an acute fracture or dislocation. A chronic appearing deformity is seen involving the proximal phalanx of the fifth left toe. There is no evidence of arthropathy or other focal bone abnormality. Soft tissues are unremarkable. IMPRESSION: No acute osseous abnormality. Electronically Signed   By: 01/15/2021 M.D.   On: 11/15/2020 03:17  ____________________________________________   PROCEDURES  Procedure(s) performed (including Critical Care):  Procedures   ____________________________________________   INITIAL IMPRESSION / ASSESSMENT AND PLAN / ED COURSE  As part of my medical decision making, I reviewed the following data within the electronic MEDICAL RECORD NUMBER Nursing notes reviewed and incorporated, Old chart reviewed, Radiograph reviewed, Notes from prior ED visits and [place] Controlled Substance Database     50 year old male presenting with left foot pain and swelling.  Differential diagnosis includes but is not limited to gouty arthritis, septic joint, bursitis, cellulitis, ischemia, etc.  Clinical appearance consistent with gout.  Will obtain plain film x-rays, administer NSAIDs, analgesia and reassess.  Clinical Course as of 11/15/20 0352  Mon Nov 15, 2020  [id number] Patient improved.  Updated him on x-ray results.  Will place in podiatric shoe and provide  walker.  Discharge home with prescription for Motrin and Percocet to use as needed.  Strict return precautions given.  Patient verbalizes understanding agrees with plan of care. [JS]    Clinical Course User Index [JS] Irean Hong, MD     ____________________________________________   FINAL CLINICAL IMPRESSION(S) / ED DIAGNOSES  Final diagnoses:  Acute gout of left ankle, unspecified cause     ED Discharge Orders         Ordered    ibuprofen (ADVIL) 800 MG tablet  Every 8 hours PRN        11/15/20 0351    oxyCODONE-acetaminophen (PERCOCET/ROXICET) 5-325 MG tablet  Every 4 hours PRN        11/15/20 0351          *Please note:  Kirk Lyons was evaluated in Emergency Department on 11/15/2020 for the symptoms described in the history of present illness. He was evaluated in the context of the global COVID-19 pandemic, which necessitated consideration that the patient might be at risk for infection with the SARS-CoV-2 virus that causes COVID-19. Institutional protocols and algorithms that pertain to the evaluation of patients at risk for COVID-19 are in a state of rapid change based on information released by regulatory bodies including the CDC and federal and state organizations. These policies and algorithms were followed during the patient's care in the ED.  Some ED evaluations and interventions may be delayed as a result of limited staffing during and the pandemic.*   Note:  This document was prepared using Dragon voice recognition software and may include unintentional dictation errors.   Irean Hong, MD 11/15/20 [phone]

## 2020-11-15 NOTE — ED Notes (Signed)
Pt states for the last four days he has had pain in his left leg and foot. No injury noted, foot is pink in color. Pt does not have a family doctor. Pt states he has had left ankle pain for the last several years.

## 2020-11-15 NOTE — Discharge Instructions (Signed)
1.  Wear podiatric shoe and use walker as needed. 2.  You may take pain medicines as needed (Motrin/Percocet #15). 3.  Elevate affected area several times daily. 4.  Return to the ER for worsening symptoms, persistent vomiting, difficulty breathing or other concerns.

## 2020-11-15 NOTE — ED Triage Notes (Signed)
Pt states left foot pain that extends up leg. Pt states foot at times is purple. Pt also complains of foot swelling. Decreased cap refill noted to foot, however pt has 1+ palpable pedal pulse with sensation intact.

## 2021-02-12 ENCOUNTER — Other Ambulatory Visit: Payer: Self-pay

## 2021-02-12 DIAGNOSIS — I10 Essential (primary) hypertension: Secondary | ICD-10-CM | POA: Insufficient documentation

## 2021-02-12 DIAGNOSIS — Z79899 Other long term (current) drug therapy: Secondary | ICD-10-CM | POA: Insufficient documentation

## 2021-02-12 DIAGNOSIS — F17210 Nicotine dependence, cigarettes, uncomplicated: Secondary | ICD-10-CM | POA: Insufficient documentation

## 2021-02-12 LAB — BASIC METABOLIC PANEL
Anion gap: 10 (ref 5–15)
BUN: 15 mg/dL (ref 6–20)
CO2: 24 mmol/L (ref 22–32)
Calcium: 8.8 mg/dL — ABNORMAL LOW (ref 8.9–10.3)
Chloride: 104 mmol/L (ref 98–111)
Creatinine, Ser: 1.03 mg/dL (ref 0.61–1.24)
GFR, Estimated: >60 mL/min (ref >60)
Glucose, Bld: 233 mg/dL — ABNORMAL HIGH (ref 70–99)
Potassium: 4 mmol/L (ref 3.5–5.1)
Sodium: 138 mmol/L (ref 135–145)

## 2021-02-12 LAB — CBC
HCT: 49.6 % (ref 39.0–52.0)
Hemoglobin: 17.1 g/dL — ABNORMAL HIGH (ref 13.0–17.0)
MCH: 32 pg (ref 26.0–34.0)
MCHC: 34.5 g/dL (ref 30.0–36.0)
MCV: 92.7 fL (ref 80.0–100.0)
Platelets: 262 10*3/uL (ref 150–400)
RBC: 5.35 MIL/uL (ref 4.22–5.81)
RDW: 11.7 % (ref 11.5–15.5)
WBC: 10.2 10*3/uL (ref 4.0–10.5)
nRBC: 0 % (ref 0.0–0.2)

## 2021-02-12 NOTE — ED Triage Notes (Signed)
Pt had second pfizer covid vaccine and had dizziness immediately after injection. At home, pt woke up with headache, diarrhea, and hypertension. Pt is Aox4, NAD. Pt denies CP, SOB, dizziness. Pt c/o of right sided headache only at this time.

## 2021-02-13 ENCOUNTER — Emergency Department
Admission: EM | Admit: 2021-02-13 | Discharge: 2021-02-13 | Discharge status: Against Medical Advice | Payer: Self-pay | Attending: Emergency Medicine | Admitting: Emergency Medicine

## 2021-02-13 ENCOUNTER — Emergency Department: Payer: Self-pay

## 2021-02-13 DIAGNOSIS — R519 Headache, unspecified: Secondary | ICD-10-CM

## 2021-02-13 DIAGNOSIS — I10 Essential (primary) hypertension: Secondary | ICD-10-CM

## 2021-02-13 DIAGNOSIS — R42 Dizziness and giddiness: Secondary | ICD-10-CM

## 2021-02-13 MED ORDER — LISINOPRIL 10 MG PO TABS: 20.0000 mg | ORAL_TABLET | Freq: Once | ORAL | Status: DC

## 2021-02-13 NOTE — ED Provider Notes (Signed)
[facility] Emergency Department Provider Note  ____________________________________________   Event Date/Time   First MD Initiated Contact with Patient 02/13/21 0110     (approximate)  I have reviewed the triage vital signs and the nursing notes.   HISTORY  Chief Complaint Dizziness    HPI TIM CORRIHER is a 50 y.o. male with history of hypertension, gout who presents to the emergency department with lightheadedness, headache, diarrhea after receiving his second COVID-19 vaccination today.  States about 10 minutes after getting the vaccine he felt very lightheaded and unsteady.  No vertigo.  States that he went home and went to sleep for about 7 hours and woke up with a headache.  Has had some diarrhea today.  Denies any fever, chills, vision changes, numbness, tingling, weakness, chest pain, shortness of breath, nausea, vomiting.  States he was just recently diagnosed with high blood pressure and that he has been coming to the emergency department for refills.  States he does not have a primary care doctor.  It appears he is on lisinopril 10 mg daily.  He has not had his dose today.  He states he was concerned that his symptoms were secondary to a reaction to the COVID-vaccine.   Past Medical History:  Diagnosis Date   Gout    Hypertension     There are no problems to display for this patient.   Past Surgical History:  Procedure Laterality Date   HERNIA REPAIR      Prior to Admission medications   Medication Sig Start Date End Date Taking? Authorizing Provider  albuterol (VENTOLIN HFA) 108 (90 Base) MCG/ACT inhaler Inhale 2 puffs into the lungs every 6 (six) hours as needed for wheezing or shortness of breath. 06/06/20   Chesley Noon, MD  allopurinol (ZYLOPRIM) 100 MG tablet Take 1 tablet (100 mg total) by mouth daily. 08/04/20 08/04/21  Fisher, Roselyn Bering, PA-C  feeding supplement (ENSURE CLINICAL STRENGTH) LIQD Take 237 mLs by mouth daily as  needed (nutrition).    [provider]  ibuprofen (ADVIL) 800 MG tablet Take 1 tablet (800 mg total) by mouth every 8 (eight) hours as needed for moderate pain. 11/15/20   Irean Hong, MD  lisinopril (ZESTRIL) 10 MG tablet Take 1 tablet (10 mg total) by mouth daily. 08/04/20 09/03/20  Fisher, Roselyn Bering, PA-C  meloxicam (MOBIC) 15 MG tablet Take 1 tablet (15 mg total) by mouth daily. 08/04/20 08/04/21  Sherrie Mustache Roselyn Bering, PA-C  Multiple Vitamins-Minerals (EMERGEN-C VITAMIN C) PACK Take 1 Package by mouth as needed (immune system support).    [provider]  oxyCODONE-acetaminophen (PERCOCET/ROXICET) 5-325 MG tablet Take 1 tablet by mouth every 4 (four) hours as needed for severe pain. 11/15/20   Irean Hong, MD  predniSONE (STERAPRED UNI-PAK 21 TAB) 10 MG (21) TBPK tablet Take 6 tablets the first day, take 5 tablets the second day, take 4 tablets the third day, take 3 tablets the fourth day, take 2 tablets the fifth day, take 1 tablet the sixth day. 08/07/20   Orvil Feil, PA-C  triamcinolone ointment (KENALOG) 0.5 % Apply 1 application topically 2 (two) times daily. 10/05/19   Tommi Rumps, PA-C    Allergies Erythromycin  No family history on file.  Social History Social History   Tobacco Use   Smoking status: Some Days    Types: Cigarettes   Smokeless tobacco: Never  Substance Use Topics   Alcohol use: Yes    Alcohol/week: 3.0  standard drinks    Types: 3 Cans of beer per week    Comment: social   Drug use: No    Review of Systems Constitutional: No fever. Eyes: No visual changes. ENT: No sore throat. Cardiovascular: Denies chest pain. Respiratory: Denies shortness of breath. Gastrointestinal: No nausea, vomiting, diarrhea. Genitourinary: Negative for dysuria. Musculoskeletal: Negative for back pain. Skin: Negative for rash. Neurological: Negative for focal weakness or numbness.  ____________________________________________   PHYSICAL EXAM:  VITAL  SIGNS: ED Triage Vitals  Enc Vitals Group     BP 02/12/21 2231 (!) 164/124     Pulse Rate 02/12/21 2231 (!) 107     Resp 02/12/21 2231 18     Temp 02/12/21 2231 (!) 97.5 F (36.4 C)     Temp Source 02/12/21 2231 Oral     SpO2 02/12/21 2231 95 %     Weight 02/12/21 2233 230 lb (104.3 kg)     Height 02/12/21 2233 6\' 2"  (1.88 m)     Head Circumference --      Peak Flow --      Pain Score 02/12/21 2233 6     Pain Loc --      Pain Edu? --      Excl. in GC? --    CONSTITUTIONAL: Alert and oriented and responds appropriately to questions. Well-appearing; well-nourished HEAD: Normocephalic, atraumatic EYES: Conjunctivae clear, pupils appear equal, EOM appear intact ENT: normal nose; moist mucous membranes NECK: Supple, normal ROM CARD: RRR; S1 and S2 appreciated; no murmurs, no clicks, no rubs, no gallops RESP: Normal chest excursion without splinting or tachypnea; breath sounds clear and equal bilaterally; no wheezes, no rhonchi, no rales, no hypoxia or respiratory distress, speaking full sentences ABD/GI: Normal bowel sounds; non-distended; soft, non-tender, no rebound, no guarding, no peritoneal signs, no hepatosplenomegaly BACK: The back appears normal EXT: Normal ROM in all joints; no deformity noted, no edema; no cyanosis SKIN: Normal color for age and race; warm; no rash on exposed skin NEURO: Moves all extremities equally, normal sensation diffusely, normal speech, cranial nerves II through XII intact, normal gait PSYCH: The patient's mood and manner are appropriate.  ____________________________________________   LABS (all labs ordered are listed, but only abnormal results are displayed)  Labs Reviewed  BASIC METABOLIC PANEL - Abnormal; Notable for the following components:      Result Value   Glucose, Bld 233 (*)    Calcium 8.8 (*)    All other components within normal limits  CBC - Abnormal; Notable for the following components:   Hemoglobin 17.1 (*)    All other  components within normal limits  URINALYSIS, COMPLETE (UACMP) WITH MICROSCOPIC  CBG MONITORING, ED   ____________________________________________  EKG   EKG Interpretation  Date/Time:  Saturday February 12 2021 22:39:26 EDT Ventricular Rate:  99 PR Interval:  182 QRS Duration: 96 QT Interval:  372 QTC Calculation: 477 R Axis:   211 Text Interpretation: Normal sinus rhythm Biatrial enlargement Right superior axis deviation Incomplete right bundle branch block Right ventricular hypertrophy Cannot rule out Anterior infarct , age undetermined Abnormal ECG No significant change since last tracing Confirmed by [id number] [phone]) on 02/13/2021 1:11:37 AM        ____________________________________________  RADIOLOGY 02/15/2021 Inigo Lantigua, personally viewed and evaluated these images (plain radiographs) as part of my medical decision making, as well as reviewing the written report by the radiologist.  ED MD interpretation:    Official radiology report(s): No results found.  ____________________________________________  PROCEDURES  Procedure(s) performed (including Critical Care):  Procedures   ____________________________________________   INITIAL IMPRESSION / ASSESSMENT AND PLAN / ED COURSE  As part of my medical decision making, I reviewed the following data within the electronic MEDICAL RECORD NUMBER Nursing notes reviewed and incorporated, Labs reviewed , EKG interpreted , Old EKG reviewed, Old chart reviewed, and Notes from prior ED visits         Patient here with complaints of headache, dizziness, diarrhea after his COVID-19 vaccine today.  It seems his biggest complaint at this time is his headache.  Discussed with patient that this could be from his COVID-19 vaccination but could also be secondary to his uncontrolled hypertension.  He has no focal neurologic deficits.  Doubt CVA.  No fever, meningismus.  I have recommended a head CT to rule out intracranial hemorrhage given  how high his blood pressure is.  Patient declines any pain medication for his headache at this time.  We will also give him a dose of his lisinopril but increase to 20 mg and see how he does with this in the emergency department.  I have had a lengthy discussion with him that it is very important at this time that he establish care with a primary care doctor to further manage this chronic issue.  Labs obtained from triage show no signs of endorgan damage.  EKG shows no new change.  Urine pending.  ED PROGRESS  Patient has told nursing staff that he does not want further work-up in the emergency department.  Patient plans to leave AGAINST MEDICAL ADVICE.  Risks of leaving without further work-up discussed with nursing staff.  Blood pressure currently 176/103.  I have given him outpatient primary care doctor follow-up and encouraged him to continue his lisinopril.  He states he has plenty of this medication at home.  I reviewed all nursing notes and pertinent previous records as available.  I have reviewed and interpreted any EKGs, lab and urine results, imaging (as available).  ____________________________________________   FINAL CLINICAL IMPRESSION(S) / ED DIAGNOSES  Final diagnoses:  Dizziness  Generalized headache  Hypertension, unspecified type     ED Discharge Orders     None       *Please note:  Kirk Lyons was evaluated in Emergency Department on 02/13/2021 for the symptoms described in the history of present illness. He was evaluated in the context of the global COVID-19 pandemic, which necessitated consideration that the patient might be at risk for infection with the SARS-CoV-2 virus that causes COVID-19. Institutional protocols and algorithms that pertain to the evaluation of patients at risk for COVID-19 are in a state of rapid change based on information released by regulatory bodies including the CDC and federal and state organizations. These policies and algorithms  were followed during the patient's care in the ED.  Some ED evaluations and interventions may be delayed as a result of limited staffing during and the pandemic.*   Note:  This document was prepared using Dragon voice recognition software and may include unintentional dictation errors.    Kambrea Carrasco, Layla Maw, DO 02/13/21 [phone]

## 2021-02-13 NOTE — Discharge Instructions (Signed)
Steps to find a Primary Care Provider (PCP):  Call [phone] or [phone] to access "[facility] Find a Doctor Service."  2.  You may also go on the [facility] website at [email or web address]  

## 2021-02-13 NOTE — ED Notes (Signed)
Upon entry to the room, pt sitting in bed, and told this RN that he would like to leave because he did not like the way the provider spoke to him. This RN encouraged him to stay to be evaluated and treated, but pt. insisted on leaving AMA. Dr. Elesa Massed notified, pt. Given discharge papers, refused d/c VS, or any further intervention.

## 2021-02-13 NOTE — ED Notes (Signed)
CT refused.

## 2021-03-30 ENCOUNTER — Other Ambulatory Visit: Payer: Self-pay

## 2021-03-30 ENCOUNTER — Emergency Department
Admission: EM | Admit: 2021-03-30 | Discharge: 2021-03-30 | Disposition: A | Discharge status: Home / Self Care | Payer: Self-pay | Attending: Emergency Medicine | Admitting: Emergency Medicine

## 2021-03-30 ENCOUNTER — Emergency Department: Payer: Self-pay

## 2021-03-30 DIAGNOSIS — I10 Essential (primary) hypertension: Secondary | ICD-10-CM | POA: Insufficient documentation

## 2021-03-30 DIAGNOSIS — F17210 Nicotine dependence, cigarettes, uncomplicated: Secondary | ICD-10-CM | POA: Insufficient documentation

## 2021-03-30 DIAGNOSIS — Z79899 Other long term (current) drug therapy: Secondary | ICD-10-CM | POA: Insufficient documentation

## 2021-03-30 DIAGNOSIS — R42 Dizziness and giddiness: Secondary | ICD-10-CM

## 2021-03-30 DIAGNOSIS — J019 Acute sinusitis, unspecified: Secondary | ICD-10-CM | POA: Insufficient documentation

## 2021-03-30 LAB — URINALYSIS, COMPLETE (UACMP) WITH MICROSCOPIC
Bacteria, UA: NONE SEEN
Bilirubin Urine: NEGATIVE
Glucose, UA: 50 mg/dL — AB
Hgb urine dipstick: NEGATIVE
Ketones, ur: 5 mg/dL — AB
Nitrite: NEGATIVE
Protein, ur: 100 mg/dL — AB
Specific Gravity, Urine: 1.025 (ref 1.005–1.030)
pH: 5 (ref 5.0–8.0)

## 2021-03-30 LAB — CBC
HCT: 45.6 % (ref 39.0–52.0)
Hemoglobin: 16.2 g/dL (ref 13.0–17.0)
MCH: 33.3 pg (ref 26.0–34.0)
MCHC: 35.5 g/dL (ref 30.0–36.0)
MCV: 93.8 fL (ref 80.0–100.0)
Platelets: 260 10*3/uL (ref 150–400)
RBC: 4.86 MIL/uL (ref 4.22–5.81)
RDW: 12.3 % (ref 11.5–15.5)
WBC: 9.8 10*3/uL (ref 4.0–10.5)
nRBC: 0 % (ref 0.0–0.2)

## 2021-03-30 LAB — BASIC METABOLIC PANEL
Anion gap: 9 (ref 5–15)
BUN: 16 mg/dL (ref 6–20)
CO2: 25 mmol/L (ref 22–32)
Calcium: 8.9 mg/dL (ref 8.9–10.3)
Chloride: 101 mmol/L (ref 98–111)
Creatinine, Ser: 1.21 mg/dL (ref 0.61–1.24)
GFR, Estimated: >60 mL/min (ref >60)
Glucose, Bld: 182 mg/dL — ABNORMAL HIGH (ref 70–99)
Potassium: 3.5 mmol/L (ref 3.5–5.1)
Sodium: 135 mmol/L (ref 135–145)

## 2021-03-30 LAB — TROPONIN I (HIGH SENSITIVITY)
Troponin I (High Sensitivity): 58 ng/L — ABNORMAL HIGH (ref <18)
Troponin I (High Sensitivity): 62 ng/L — ABNORMAL HIGH (ref <18)

## 2021-03-30 MED ORDER — ACETAMINOPHEN 500 MG PO TABS
1000.0000 mg | ORAL_TABLET | Freq: Once | ORAL | Status: AC
  Administered 2021-03-30: 1000 mg via ORAL
  Filled 2021-03-30: qty 2

## 2021-03-30 MED ORDER — LISINOPRIL 10 MG PO TABS
20.0000 mg | ORAL_TABLET | Freq: Once | ORAL | Status: AC
  Administered 2021-03-30: 20 mg via ORAL
  Filled 2021-03-30: qty 2

## 2021-03-30 MED ORDER — AMOXICILLIN-POT CLAVULANATE 875-125 MG PO TABS: 1.0000 | ORAL_TABLET | Freq: Two times a day (BID) | ORAL | 0 refills | Status: AC

## 2021-03-30 NOTE — Discharge Instructions (Addendum)
Your elevated blood pressure is causing a strain on your heart and causing your troponins to be slightly elevated.  We discussed admission for helping to get your blood pressure low but given you are feeling fine you prefer to follow-up outpatient. Take two lisinopril in the morning!  It is important however if you develop chest pain or worsening dizziness that you return to the ER immediately.    They are going to get you into see a cardiologist on Monday at 4 PM and they should call tomorrow to give you details.  I have attached the doctors number above to call if you do not hear from them.  We are also starting you on some antibiotics for possible sinusitis

## 2021-03-30 NOTE — ED Notes (Signed)
See triage note  presents s/p fall  states fell out of bed a few days ago  states hit his head at that time  cont's to be dizzy  then fell again today

## 2021-03-30 NOTE — ED Triage Notes (Signed)
Pt to ED for fall out of bed a few days ago, now having headache for a few days after hitting head. +dizziness while going up stairs today and fell, did not hit head.  Does not take blood thinners Denies n/v Alert and oriented

## 2021-03-30 NOTE — ED Provider Notes (Signed)
[facility] Emergency Department Provider Note  ____________________________________________   Event Date/Time   First MD Initiated Contact with Patient 03/30/21 1628     (approximate)  I have reviewed the triage vital signs and the nursing notes.   HISTORY  Chief Complaint Fall    HPI Kirk Lyons is a 50 y.o. male with hypertension and gout who comes in with concerns for dizziness.  Patient states that he fell out of bed 3 days ago.  He states that he is sleep apnea and does not wear a CPAP and sometimes he wakes up and gets disoriented will have a fall that is mechanical in nature.  He hit the right side of his forehead.  Having some headaches little bit of intermittent, with some associated dizziness.  States that he a little dizzy today to the point where he fell but he did not hit his head.  Does not take any blood thinners.  No nausea or vomiting.  States that he feels a little off right now but denies any severe dizziness.  He is able to ambulate normally.  Patient reports he has been out of his blood pressure medication for 2 days          Past Medical History:  Diagnosis Date   Gout    Hypertension     There are no problems to display for this patient.   Past Surgical History:  Procedure Laterality Date   HERNIA REPAIR      Prior to Admission medications   Medication Sig Start Date End Date Taking? Authorizing Provider  albuterol (VENTOLIN HFA) 108 (90 Base) MCG/ACT inhaler Inhale 2 puffs into the lungs every 6 (six) hours as needed for wheezing or shortness of breath. 06/06/20   Chesley Noon, MD  allopurinol (ZYLOPRIM) 100 MG tablet Take 1 tablet (100 mg total) by mouth daily. 08/04/20 08/04/21  Fisher, Roselyn Bering, PA-C  feeding supplement (ENSURE CLINICAL STRENGTH) LIQD Take 237 mLs by mouth daily as needed (nutrition).    [provider]  ibuprofen (ADVIL) 800 MG tablet Take 1 tablet (800 mg total) by mouth every 8 (eight)  hours as needed for moderate pain. 11/15/20   Irean Hong, MD  lisinopril (ZESTRIL) 10 MG tablet Take 1 tablet (10 mg total) by mouth daily. 08/04/20 09/03/20  Fisher, Roselyn Bering, PA-C  meloxicam (MOBIC) 15 MG tablet Take 1 tablet (15 mg total) by mouth daily. 08/04/20 08/04/21  Sherrie Mustache Roselyn Bering, PA-C  Multiple Vitamins-Minerals (EMERGEN-C VITAMIN C) PACK Take 1 Package by mouth as needed (immune system support).    [provider]  oxyCODONE-acetaminophen (PERCOCET/ROXICET) 5-325 MG tablet Take 1 tablet by mouth every 4 (four) hours as needed for severe pain. 11/15/20   Irean Hong, MD  predniSONE (STERAPRED UNI-PAK 21 TAB) 10 MG (21) TBPK tablet Take 6 tablets the first day, take 5 tablets the second day, take 4 tablets the third day, take 3 tablets the fourth day, take 2 tablets the fifth day, take 1 tablet the sixth day. 08/07/20   Orvil Feil, PA-C  triamcinolone ointment (KENALOG) 0.5 % Apply 1 application topically 2 (two) times daily. 10/05/19   Tommi Rumps, PA-C    Allergies Erythromycin  No family history on file.  Social History Social History   Tobacco Use   Smoking status: Some Days    Types: Cigarettes   Smokeless tobacco: Never  Substance Use Topics   Alcohol use: Yes    Alcohol/week:  3.0 standard drinks    Types: 3 Cans of beer per week    Comment: social   Drug use: No      Review of Systems Constitutional: No fever/chills Eyes: No visual changes. ENT: No sore throat. Cardiovascular: Denies chest pain. Respiratory: Denies shortness of breath. Gastrointestinal: No abdominal pain.  No nausea, no vomiting.  No diarrhea.  No constipation. Genitourinary: Negative for dysuria. Musculoskeletal: Negative for back pain. Skin: Negative for rash. Neurological: Positive headache, dizzy All other ROS negative ____________________________________________   PHYSICAL EXAM:  VITAL SIGNS: ED Triage Vitals  Enc Vitals Group     BP 03/30/21 1613 (!) 182/123      Pulse Rate 03/30/21 1613 (!) 107     Resp 03/30/21 1613 20     Temp 03/30/21 1613 97.9 F (36.6 C)     Temp Source 03/30/21 1613 Oral     SpO2 03/30/21 1613 95 %     Weight 03/30/21 1613 230 lb (104.3 kg)     Height 03/30/21 1613 6\' 2"  (1.88 m)     Head Circumference --      Peak Flow --      Pain Score 03/30/21 1620 6     Pain Loc --      Pain Edu? --      Excl. in GC? --     Constitutional: Alert and oriented. Well appearing and in no acute distress. Eyes: Conjunctivae are normal. EOMI. Head: Little bit of tenderness on the right forehead no open skin or abrasion Nose: No congestion/rhinnorhea. Mouth/Throat: Mucous membranes are moist.   Neck: No stridor. Trachea Midline. FROM Cardiovascular: Normal rate, regular rhythm. Grossly normal heart sounds.  Good peripheral circulation. Respiratory: Normal respiratory effort.  No retractions. Lungs CTAB. Gastrointestinal: Soft and nontender. No distention. No abdominal bruits.  Musculoskeletal: No lower extremity tenderness nor edema.  No joint effusions. Neurologic:  Normal speech and language. No gross focal neurologic deficits are appreciated.  Current nerves II through XII are intact.  Equal strength in arms and legs.  Finger-nose intact.  Ambulatory without any ataxia. Skin:  Skin is warm, dry and intact. No rash noted. Psychiatric: Mood and affect are normal. Speech and behavior are normal. GU: Deferred   ____________________________________________   LABS (all labs ordered are listed, but only abnormal results are displayed)  Labs Reviewed  CBC  BASIC METABOLIC PANEL  URINALYSIS, COMPLETE (UACMP) WITH MICROSCOPIC  CBG MONITORING, ED   ____________________________________________   ED ECG REPORT I, 04/01/21, the attending physician, personally viewed and interpreted this ECG.  Sinus tachycardia rate of 108, no ST elevation, no T wave inversions, normal  intervals ____________________________________________  RADIOLOGY Concha Se, personally viewed and evaluated these images (plain radiographs) as part of my medical decision making, as well as reviewing the written report by the radiologist.  ED MD interpretation: No intracranial hemorrhage  Official radiology report(s): CT HEAD WO CONTRAST (Vela Prose)  Result Date: 03/30/2021 CLINICAL DATA:  Headache EXAM: CT HEAD WITHOUT CONTRAST TECHNIQUE: Contiguous axial images were obtained from the base of the skull through the vertex without intravenous contrast. COMPARISON:  None. FINDINGS: Brain: No evidence of acute infarction, hemorrhage, hydrocephalus, extra-axial collection or mass lesion/mass effect. Vascular: No hyperdense vessel or unexpected calcification. Skull: Normal. Negative for fracture or focal lesion. Sinuses/Orbits: Mucosal thickening of the right sphenoid and ethmoid sinuses. Other: None. IMPRESSION: No acute intracranial abnormalities. Mucosal thickening of the right sphenoid and ethmoid sinuses, findings can be seen in the  setting of sinusitis. Electronically Signed   By: Allegra Lai M.D.   On: 03/30/2021 17:57    ____________________________________________   PROCEDURES  Procedure(s) performed (including Critical Care):  Procedures   ____________________________________________   INITIAL IMPRESSION / ASSESSMENT AND PLAN / ED COURSE  Kirk Lyons was evaluated in Emergency Department on 03/30/2021 for the symptoms described in the history of present illness. He was evaluated in the context of the global COVID-19 pandemic, which necessitated consideration that the patient might be at risk for infection with the SARS-CoV-2 virus that causes COVID-19. Institutional protocols and algorithms that pertain to the evaluation of patients at risk for COVID-19 are in a state of rapid change based on information released by regulatory bodies including the CDC and federal and state  organizations. These policies and algorithms were followed during the patient's care in the ED.    Patient is a 50 year old who comes in with concerns for dizziness and headaches after hitting his head a few days ago.  Patient is on any blood thinners but given the continued symptoms will get CT head admission of his intercranial hemorrhage.  This also could be from his elevated blood pressure but is about his blood pressure meds for 2 days.  He states that he already has called in a refill to start taking them.  Encourage patient that if the 10 mg is not helping him to go up to 20 mg.  Labs ordered to make sure numbness of ACS, electrolyte abnormalities, anemia.  EKG was ordered without evidence of arrhythmia  CT head was negative but does show some signs of possible sinusitis.  Patient does report a lot of postnasal drainage going on for almost over a week therefore will give a course antibiotics  EKG without evidence of arrhythmia or ACS.  His troponin was elevated at 58 but upon repeat was 62 I suspect this is most likely demand from his elevated blood pressure being off of his medication although he states that even when he has been on the 10 mg his blood pressure still elevated.  Encouraged him to go up to 20 mg.  We discussed admission for hypertensive urgency with elevated troponin but patient states that he feels fine and would like to go home.  He is completely asymptomatic at this time.  He would rather follow-up outpatient with cardiology.  Dr. Duke Salvia said he can get him in tomorrow for an appointment 830 but patient stating that he would not have a ride that early means he does not have any money to pay for co-pay.  Explained to patient the importance of having an early follow-up due to my concern that his heart is being stressed and again offered patient admission but he declined.  He is requesting to go on Monday afternoon.  They are able to get him in on Monday.  He understands that if he  develops any chest pain or dizziness prior to then that he needs to come back immediately he understands that I cannot predict if he has a future heart issue and that there is clearly some evidence of his heart being strained but at this time he would really like to go home and will return to the ER if things are worsening prior to Monday  I discussed the provisional nature of ED diagnosis, the treatment so far, the ongoing plan of care, follow up appointments and return precautions with the patient and any family or support people present. They expressed understanding and agreed  with the plan, discharged home.            ____________________________________________   FINAL CLINICAL IMPRESSION(S) / ED DIAGNOSES   Final diagnoses:  Hypertension, unspecified type  Dizzy  Acute sinusitis, recurrence not specified, unspecified location      MEDICATIONS GIVEN DURING THIS VISIT:  Medications  acetaminophen (TYLENOL) tablet 1,000 mg (1,000 mg Oral Given 03/30/21 1702)  lisinopril (ZESTRIL) tablet 20 mg (20 mg Oral Given 03/30/21 1701)     ED Discharge Orders          Ordered    amoxicillin-clavulanate (AUGMENTIN) 875-125 MG tablet  2 times daily        03/30/21 2015             Note:  This document was prepared using Dragon voice recognition software and may include unintentional dictation errors.    Concha Se, MD 03/30/21 2017

## 2021-04-01 NOTE — Progress Notes (Incomplete)
Advanced Hypertension Clinic Initial Assessment:    Date:  04/04/2021   ID:  Kirk Lyons, DOB Aug 07, 1970, MRN [id number]  PCP:  Patient, No Pcp Per (Inactive)  Cardiologist:  None  Nephrologist:  Referring MD: No ref. provider found   CC: Hypertension  History of Present Illness:    Kirk Lyons is a 50 y.o. male with a hx of gout and hypertension, here to establish care in the Advanced Hypertension Clinic. He was seen in the ED 03/30/2021 with concerns for dizziness and headaches. He had fallen out of bed 3 days prior, striking the right side of his forehead. He reported sleep apnea (no CPAP) and sometimes waking up with disorientation. On admission his blood pressure was 182/123. It was noted he had been out of his antihypertensives for 2 days. ECG showed sinus tachycardia, rate 108, no ST elevation, no T wave inversions, and normal intervals. Head CT was negative with signs of possible sinusitis. Troponin was elevated at 58, and was 62 on repeat. At discharge he was asymptomatic. Kirk Lyons preferred to follow-up with cardiology rather than be admitted for hypertensive urgency with elevated troponin.  Today,  He denies any palpitations, chest pain, or shortness of breath. No lightheadedness, headaches, syncope, orthopnea, or PND. Also has no lower extremity edema or exertional symptoms.   Previous antihypertensives:  Secondary Causes of Hypertension  Medications/Herbal: OCP, steroids, stimulants, antidepressants, weight loss medication, immune suppressants, NSAIDs, sympathomimetics, alcohol, caffeine, licorice, ginseng, St. John's wort, chemo  Sleep Apnea Renal artery stenosis Hyperaldosteronism Hyper/hypothyroidism Pheochromocytoma: palpitations, tachycardia, headache, diaphoresis (plasma metanephrines) Cushing's syndrome: Cushingoid facies, central obesity, proximal muscle weakness, and ecchymoses, adrenal incidentaloma (cortisol) Coarctation of the aorta  Past  Medical History:  Diagnosis Date   Gout    Hypertension     Past Surgical History:  Procedure Laterality Date   HERNIA REPAIR      Current Medications: No outpatient medications have been marked as taking for the 04/04/21 encounter (Appointment) with Chilton Si, MD.     Allergies:   Erythromycin   Social History   Socioeconomic History   Marital status: Single    Spouse name: Not on file   Number of children: Not on file   Years of education: Not on file   Highest education level: Not on file  Occupational History   Not on file  Tobacco Use   Smoking status: Some Days    Types: Cigarettes   Smokeless tobacco: Never  Substance and Sexual Activity   Alcohol use: Yes    Alcohol/week: 3.0 standard drinks    Types: 3 Cans of beer per week    Comment: social   Drug use: No   Sexual activity: Not on file  Other Topics Concern   Not on file  Social History Narrative   Not on file   Social Determinants of Health   Financial Resource Strain: Not on file  Food Insecurity: Not on file  Transportation Needs: Not on file  Physical Activity: Not on file  Stress: Not on file  Social Connections: Not on file     Family History: The patient's family history is not on file.  ROS:   Please see the history of present illness.    (+) All other systems reviewed and are negative.  EKGs/Labs/Other Studies Reviewed:    CT Head 03/30/2021: IMPRESSION: No acute intracranial abnormalities.   Mucosal thickening of the right sphenoid and ethmoid sinuses, findings can be seen in the setting of sinusitis.  CXR Portable 09/05/2020: FINDINGS:   Hyperinflated lungs. Bibasilar atelectasis. No focal consolidation. No pleural effusion or pneumothorax.   Unremarkable cardiomediastinal silhouette.    IMPRESSION:   Hyperinflated lungs, which can be seen in the setting of viral infection.   ====================  ADDENDUM (09/05/2020 2:43 PM):  On review, the following  additional findings were noted:   As stated in the findings, minimal bibasilar atelectasis without focal consolidation.  EKG:   04/04/2021: Sinus ***. Rate *** bpm.  Recent Labs: 08/07/2020: ALT 35 03/30/2021: BUN 16; Creatinine, Ser 1.21; Hemoglobin 16.2; Platelets 260; Potassium 3.5; Sodium 135   Recent Lipid Panel No results found for: CHOL, TRIG, HDL, CHOLHDL, VLDL, LDLCALC, LDLDIRECT  Physical Exam:   VS:  There were no vitals taken for this visit. , BMI There is no height or weight on file to calculate BMI. GENERAL:  Well appearing HEENT: Pupils equal round and reactive, fundi not visualized, oral mucosa unremarkable NECK:  No jugular venous distention, waveform within normal limits, carotid upstroke brisk and symmetric, no bruits, no thyromegaly LYMPHATICS:  No cervical adenopathy LUNGS:  Clear to auscultation bilaterally HEART:  RRR.  PMI not displaced or sustained,S1 and S2 within normal limits, no S3, no S4, no clicks, no rubs, *** murmurs ABD:  Flat, positive bowel sounds normal in frequency in pitch, no bruits, no rebound, no guarding, no midline pulsatile mass, no hepatomegaly, no splenomegaly EXT:  2 plus pulses throughout, no edema, no cyanosis no clubbing SKIN:  No rashes no nodules NEURO:  Cranial nerves II through XII grossly intact, motor grossly intact throughout PSYCH:  Cognitively intact, oriented to person place and time   ASSESSMENT/PLAN:    No problem-specific Assessment & Plan notes found for this encounter.   Screening for Secondary Hypertension: { Click here to document screening for secondary causes of HTN  :[id number]}    Relevant Labs/Studies: Basic Labs Latest Ref Rng & Units 03/30/2021 02/12/2021 08/07/2020  Sodium 135 - 145 mmol/L 135 138 139  Potassium 3.5 - 5.1 mmol/L 3.5 4.0 4.2  Creatinine 0.61 - 1.24 mg/dL 9.52 8.41 3.24                       he consents to be monitored in our remote patient monitoring program through [employer].  he  will track his blood pressure twice daily and understands that these trends will help [place] to adjust his medications as needed prior to his next appointment.  he *** interested in enrolling in the PREP exercise and nutrition program through the [facility].     Disposition:    FU with APP/PharmD in ***1 month.   FU with Tiffany C. Duke Salvia, MD, [facility] in ***4 months.   Medication Adjustments/Labs and Tests Ordered: Current medicines are reviewed at length with the patient today.  Concerns regarding medicines are outlined above.   No orders of the defined types were placed in this encounter.  No orders of the defined types were placed in this encounter.   I,Mathew Stumpf,acting as a Neurosurgeon for Chilton Si, MD.,have documented all relevant documentation on the behalf of Chilton Si, MD,as directed by  Chilton Si, MD while in the presence of Chilton Si, MD.  ***  Signed, Carlena Bjornstad  04/04/2021 10:46 AM    [facility]

## 2021-04-04 ENCOUNTER — Ambulatory Visit (HOSPITAL_BASED_OUTPATIENT_CLINIC_OR_DEPARTMENT_OTHER): Payer: Self-pay | Admitting: Cardiovascular Disease

## 2021-04-05 ENCOUNTER — Telehealth: Payer: Self-pay | Admitting: Licensed Clinical Social Worker

## 2021-04-05 NOTE — Telephone Encounter (Signed)
Aware pt cancelled appointment yesterday due to concerns w/ lack of insurance and copays; he rescheduled to November. LCSW attempted to reach pt this morning to discuss concerns and see if eligible for patient assistance programs. No answer, message left on preferred # [phone].   Octavio Graves, MSW, LCSW [facility] Heart/Vascular Care Navigation  [phone]

## 2021-04-06 ENCOUNTER — Telehealth: Payer: Self-pay | Admitting: Licensed Clinical Social Worker

## 2021-04-06 NOTE — Telephone Encounter (Signed)
Attempted second f/u call to pt to see if interested in assistance applications. I left message w/ my name and call back number on [phone]. Will f/u again as time allows if no call back.   Octavio Graves, MSW, LCSW [facility] Heart/Vascular Care Navigation  [phone]

## 2021-04-08 ENCOUNTER — Telehealth: Payer: Self-pay | Admitting: Licensed Clinical Social Worker

## 2021-04-08 NOTE — Telephone Encounter (Signed)
LCSW attempted for a third time to reach pt. No answer, able to leave voicemail again w/ name and number on [phone]. No DPR on file for alternate contact. Therefore remain available if pt returns my call/would like assistance w/ applications in the future.   Octavio Graves, MSW, LCSW [facility] Heart/Vascular Care Navigation  [phone]

## 2021-04-11 ENCOUNTER — Ambulatory Visit (HOSPITAL_BASED_OUTPATIENT_CLINIC_OR_DEPARTMENT_OTHER): Payer: Self-pay | Admitting: Cardiovascular Disease

## 2021-04-27 ENCOUNTER — Telehealth: Payer: Self-pay | Admitting: Licensed Clinical Social Worker

## 2021-04-27 ENCOUNTER — Emergency Department
Admission: EM | Admit: 2021-04-27 | Discharge: 2021-04-27 | Disposition: A | Discharge status: Home / Self Care | Payer: Self-pay | Attending: Emergency Medicine | Admitting: Emergency Medicine

## 2021-04-27 ENCOUNTER — Other Ambulatory Visit: Payer: Self-pay

## 2021-04-27 ENCOUNTER — Emergency Department: Payer: Self-pay

## 2021-04-27 DIAGNOSIS — W06XXXA Fall from bed, initial encounter: Secondary | ICD-10-CM | POA: Insufficient documentation

## 2021-04-27 DIAGNOSIS — W19XXXA Unspecified fall, initial encounter: Secondary | ICD-10-CM

## 2021-04-27 DIAGNOSIS — F17210 Nicotine dependence, cigarettes, uncomplicated: Secondary | ICD-10-CM | POA: Insufficient documentation

## 2021-04-27 DIAGNOSIS — Z79899 Other long term (current) drug therapy: Secondary | ICD-10-CM | POA: Insufficient documentation

## 2021-04-27 DIAGNOSIS — I10 Essential (primary) hypertension: Secondary | ICD-10-CM | POA: Insufficient documentation

## 2021-04-27 DIAGNOSIS — R519 Headache, unspecified: Secondary | ICD-10-CM

## 2021-04-27 DIAGNOSIS — S0990XA Unspecified injury of head, initial encounter: Secondary | ICD-10-CM | POA: Insufficient documentation

## 2021-04-27 MED ORDER — ACETAMINOPHEN 500 MG PO TABS
1000.0000 mg | ORAL_TABLET | Freq: Once | ORAL | Status: AC
  Administered 2021-04-27: 1000 mg via ORAL
  Filled 2021-04-27: qty 2

## 2021-04-27 MED ORDER — KETOROLAC TROMETHAMINE 30 MG/ML IJ SOLN
30.0000 mg | Freq: Once | INTRAMUSCULAR | Status: AC
  Administered 2021-04-27: 30 mg via INTRAMUSCULAR
  Filled 2021-04-27: qty 1

## 2021-04-27 NOTE — ED Provider Notes (Signed)
[facility] Emergency Department Provider Note ____________________________________________   Event Date/Time   First MD Initiated Contact with Patient 04/27/21 1808     (approximate)  I have reviewed the triage vital signs and the nursing notes.  HISTORY  Chief Complaint Fall   HPI Kirk Lyons is a 50 y.o. Kirk Lyons presents to the ED for evaluation of fall and facial injury.   Chart review indicates hx obesity, HTN, gout.   Patient presents to the ED for the ration of a bad headache and right-sided facial pain after accidentally falling out of bed yesterday morning and striking the right side of his face on nearby furniture.  He reports concern for undiagnosed sleep apnea, he does not wear his CPAP at home even though he has 1, and reports he frequently falls out of bed due to this.  He reports feeling out of bed yesterday morning, about 36 hours ago, and striking the right side of his face on a nearby table.  Denies known syncope or additional trauma be on the right side of his head and face.  No subsequent injuries or additional traumas.  No subsequent syncopal episodes, emesis, gait changes or focal weakness.  He reports a severe headache, without taking medications at home.   Past Medical History:  Diagnosis Date   Gout    Hypertension     There are no problems to display for this patient.   Past Surgical History:  Procedure Laterality Date   HERNIA REPAIR      Prior to Admission medications   Medication Sig Start Date End Date Taking? Authorizing Provider  albuterol (VENTOLIN HFA) 108 (90 Base) MCG/ACT inhaler Inhale 2 puffs into the lungs every 6 (six) hours as needed for wheezing or shortness of breath. 06/06/20   Chesley Noon, MD  allopurinol (ZYLOPRIM) 100 MG tablet Take 1 tablet (100 mg total) by mouth daily. 08/04/20 08/04/21  Fisher, Roselyn Bering, PA-C  feeding supplement (ENSURE CLINICAL STRENGTH) LIQD Take 237 mLs by mouth daily as  needed (nutrition).    [provider]  ibuprofen (ADVIL) 800 MG tablet Take 1 tablet (800 mg total) by mouth every 8 (eight) hours as needed for moderate pain. 11/15/20   Irean Hong, MD  lisinopril (ZESTRIL) 10 MG tablet Take 1 tablet (10 mg total) by mouth daily. 08/04/20 09/03/20  Fisher, Roselyn Bering, PA-C  meloxicam (MOBIC) 15 MG tablet Take 1 tablet (15 mg total) by mouth daily. 08/04/20 08/04/21  Sherrie Mustache Roselyn Bering, PA-C  Multiple Vitamins-Minerals (EMERGEN-C VITAMIN C) PACK Take 1 Package by mouth as needed (immune system support).    [provider]  oxyCODONE-acetaminophen (PERCOCET/ROXICET) 5-325 MG tablet Take 1 tablet by mouth every 4 (four) hours as needed for severe pain. 11/15/20   Irean Hong, MD  predniSONE (STERAPRED UNI-PAK 21 TAB) 10 MG (21) TBPK tablet Take 6 tablets the first day, take 5 tablets the second day, take 4 tablets the third day, take 3 tablets the fourth day, take 2 tablets the fifth day, take 1 tablet the sixth day. 08/07/20   Orvil Feil, PA-C  triamcinolone ointment (KENALOG) 0.5 % Apply 1 application topically 2 (two) times daily. 10/05/19   Tommi Rumps, PA-C    Allergies Erythromycin  No family history on file.  Social History Social History   Tobacco Use   Smoking status: Some Days    Types: Cigarettes   Smokeless tobacco: Never  Substance Use Topics   Alcohol use:  Yes    Alcohol/week: 3.0 standard drinks    Types: 3 Cans of beer per week    Comment: social   Drug use: No    Review of Systems  Constitutional: No fever/chills Eyes: No visual changes. ENT: No sore throat. Cardiovascular: Denies chest pain. Respiratory: Denies shortness of breath. Gastrointestinal: No abdominal pain.  No nausea, no vomiting.  No diarrhea.  No constipation. Genitourinary: Negative for dysuria. Musculoskeletal: Negative for back pain. Skin: Negative for rash. Neurological: Negative focal weakness or numbness.  Positive for  headache   ____________________________________________   PHYSICAL EXAM:  VITAL SIGNS: Vitals:   04/27/21 1551 04/27/21 1855  BP: (!) 185/142 (!) 168/99  Pulse: (!) 107 99  Resp: 19 18  Temp: 98.3 F (36.8 C)   SpO2: 95% 96%    Constitutional: Alert and oriented. Well appearing and in no acute distress. Eyes: Conjunctivae are normal. PERRL. EOMI. Head: No external signs of trauma.  Poorly localizing and mild tenderness to palpation throughout right-sided midface.  No periorbital step-offs or signs of EOM entrapment.  No laceration or evidence of open injury. Nose: No congestion/rhinnorhea. Mouth/Throat: Mucous membranes are moist.  Oropharynx non-erythematous. Neck: No stridor. No cervical spine tenderness to palpation. No meningismus or neck tenderness. Cardiovascular: Normal rate, regular rhythm. Grossly normal heart sounds.  Good peripheral circulation. Respiratory: Normal respiratory effort.  No retractions. Lungs CTAB. Gastrointestinal: Soft , nondistended, nontender to palpation. No CVA tenderness. Musculoskeletal: No lower extremity tenderness nor edema.  No joint effusions. No signs of acute trauma. Neurologic:  Normal speech and language. No gross focal neurologic deficits are appreciated. No gait instability noted. Skin:  Skin is warm, dry and intact. No rash noted. Psychiatric: Mood and affect are normal. Speech and behavior are normal.  ____________________________________________   LABS (all labs ordered are listed, but only abnormal results are displayed)  Labs Reviewed - No data to display ____________________________________________  12 Lead EKG   ____________________________________________  RADIOLOGY  ED MD interpretation: CT reviewed by me without evidence of acute intracranial pathology.  Official radiology report(s): CT HEAD WO CONTRAST ( )  Result Date: 04/27/2021 CLINICAL DATA:  Larey Seat out of bed, right jaw pain, headache EXAM: CT HEAD  WITHOUT CONTRAST TECHNIQUE: Contiguous axial images were obtained from the base of the skull through the vertex without intravenous contrast. COMPARISON:  03/30/2021 FINDINGS: Brain: No acute infarct or hemorrhage. Lateral ventricles and midline structures are unremarkable. No acute extra-axial fluid collections. No mass effect. Vascular: No hyperdense vessel or unexpected calcification. Skull: Normal. Negative for fracture or focal lesion. Sinuses/Orbits: No acute finding. Other: None. IMPRESSION: 1. Stable head CT, no acute intracranial process. Electronically Signed   By: Sharlet Salina M.D.   On: 04/27/2021 17:10   CT Maxillofacial Wo Contrast  Result Date: 04/27/2021 CLINICAL DATA:  Larey Seat out of bed, right facial trauma, right jaw pain, headache EXAM: CT MAXILLOFACIAL WITHOUT CONTRAST TECHNIQUE: Multidetector CT imaging of the maxillofacial structures was performed. Multiplanar CT image reconstructions were also generated. COMPARISON:  None. FINDINGS: Osseous: No fracture or mandibular dislocation. No destructive process. Orbits: Negative. No traumatic or inflammatory finding. Sinuses: Mild polypoid mucosal thickening within the maxillary sinuses. Minimal mucoperiosteal thickening within the left frontal sinus and anterior ethmoid air cells. Soft tissues: Negative. Limited intracranial: No significant or unexpected finding. IMPRESSION: 1. No acute facial bone fracture. Electronically Signed   By: Sharlet Salina M.D.   On: 04/27/2021 17:13    ____________________________________________   PROCEDURES and INTERVENTIONS  Procedure(s) performed (including  Critical Care):  Procedures  Medications  acetaminophen (TYLENOL) tablet 1,000 mg (1,000 mg Oral Given 04/27/21 1854)  ketorolac (TORADOL) 30 MG/ML injection 30 mg (30 mg Intramuscular Given 04/27/21 1854)    ____________________________________________   MDM / ED COURSE   50 year old male presents to the ED 36 hours after head injury,  without evidence of significant acute pathology and amenable to outpatient management.  Exam generally reassuring without evidence of neurologic vascular deficits, no signs of significant trauma.  Mild poorly localizing tenderness throughout the right side of his face.  No signs of EOM entrapment, ocular injury, loose teeth or intraoral trauma.  CT imaging without evidence of fracture or intracranial pathology. We will discharge with return precautions  Clinical Course as of 04/27/21 1923  Wed Apr 27, 2021  1922 Reassessed. Feeling a little bit better. We discussed the importance of wearing his CPAP. We discussed return precautions [DS]    Clinical Course User Index [DS] Delton Prairie, MD    ____________________________________________   FINAL CLINICAL IMPRESSION(S) / ED DIAGNOSES  Final diagnoses:  Fall, initial encounter  Injury of head, initial encounter  Bad headache     ED Discharge Orders     None        Teagen Mcleary Katrinka Blazing   Note:  This document was prepared using Dragon voice recognition software and may include unintentional dictation errors.    Delton Prairie, MD 04/27/21 [phone]

## 2021-04-27 NOTE — Telephone Encounter (Signed)
LCSW attempted for a fourth time to reach pt. No answer, able to leave voicemail again w/ name and number on [phone]. No DPR on file for alternate contact. Therefore remain available if pt returns my call/would like assistance w/ applications in the future.    Kirk Lyons, MSW, LCSW [facility] Navigation  [phone]

## 2021-04-27 NOTE — Discharge Instructions (Signed)
Please take Tylenol and ibuprofen/Advil for your pain.  It is safe to take them together, or to alternate them every few hours.  Take up to 1000mg of Tylenol at a time, up to 4 times per day.  Do not take more than 4000 mg of Tylenol in 24 hours.  For ibuprofen, take 400-600 mg, 4-5 times per day. ° ° °

## 2021-04-27 NOTE — ED Notes (Signed)
E-signature pad unavailable - Pt verbalized understanding of D/C information - no additional concerns at this time.  Pt provided a phone to call a ride.

## 2021-04-27 NOTE — ED Triage Notes (Signed)
Pt to ED for fall out of bed while asleep hitting head on bedside table. States this happens often from undiagnosed sleep apnea. Denies blood thinner use Reports headache  Reports hx HTN, has doctor appt to be treated MSE Jenise PA in triage

## 2021-04-27 NOTE — ED Provider Notes (Addendum)
Emergency Medicine Provider Triage Evaluation Note  Kirk Lyons, a 50 y.o. male  was evaluated in triage.  Pt complains of who gives a history of undiagnosed/untreated sleep apnea. He notes awakening from sleep and sitting up with associated hypersomnolence. He apparently fell over hitting his face/jaw on the nearby nightstand, yesterday. He presents with right jaw pain and associated headache. He also admits to uncontrolled HTN, with initial evaluation with a PCP next month.  Review of Systems  Positive: Right jaw pain/headache Negative: LOC, NV  Physical Exam  BP (!) 185/142   Pulse (!) 107   Temp 98.3 F (36.8 C) (Oral)   Resp 19   Ht 6\' 2"  (1.88 m)   Wt 108.9 kg   SpO2 95%   BMI 30.81 kg/m  Gen:   Awake, no distress  NAD Resp:  Normal effort CTA MSK:   Moves extremities without difficulty  Other:  CVS: RRR  Medical Decision Making  Medically screening exam initiated at 4:17 PM.  Appropriate orders placed.  Kirk Lyons was informed that the remainder of the evaluation will be completed by another provider, this initial triage assessment does not replace that evaluation, and the importance of remaining in the ED until their evaluation is complete.  Patient with ED evaluation of right jaw pain and headache after falling against nightstand while partially awake.    Kirk Llano, PA-C 04/27/21 1620    06/27/21, PA-C 04/27/21 1622    06/27/21, MD 04/27/21 [phone]

## 2021-05-24 ENCOUNTER — Ambulatory Visit (HOSPITAL_BASED_OUTPATIENT_CLINIC_OR_DEPARTMENT_OTHER): Payer: Self-pay | Admitting: Cardiovascular Disease

## 2021-06-09 ENCOUNTER — Emergency Department: Payer: 59

## 2021-06-09 ENCOUNTER — Emergency Department
Admission: EM | Admit: 2021-06-09 | Discharge: 2021-06-09 | Disposition: A | Discharge status: Home / Self Care | Payer: 59 | Attending: Emergency Medicine | Admitting: Emergency Medicine

## 2021-06-09 ENCOUNTER — Other Ambulatory Visit: Payer: Self-pay

## 2021-06-09 ENCOUNTER — Encounter: Payer: Self-pay | Admitting: Emergency Medicine

## 2021-06-09 DIAGNOSIS — Z79899 Other long term (current) drug therapy: Secondary | ICD-10-CM | POA: Insufficient documentation

## 2021-06-09 DIAGNOSIS — F17210 Nicotine dependence, cigarettes, uncomplicated: Secondary | ICD-10-CM | POA: Diagnosis not present

## 2021-06-09 DIAGNOSIS — I509 Heart failure, unspecified: Secondary | ICD-10-CM | POA: Diagnosis not present

## 2021-06-09 DIAGNOSIS — Z20822 Contact with and (suspected) exposure to covid-19: Secondary | ICD-10-CM | POA: Insufficient documentation

## 2021-06-09 DIAGNOSIS — R7989 Other specified abnormal findings of blood chemistry: Secondary | ICD-10-CM

## 2021-06-09 DIAGNOSIS — R1013 Epigastric pain: Secondary | ICD-10-CM | POA: Diagnosis not present

## 2021-06-09 DIAGNOSIS — Z760 Encounter for issue of repeat prescription: Secondary | ICD-10-CM | POA: Diagnosis not present

## 2021-06-09 DIAGNOSIS — I110 Hypertensive heart disease with heart failure: Secondary | ICD-10-CM | POA: Insufficient documentation

## 2021-06-09 DIAGNOSIS — R0602 Shortness of breath: Secondary | ICD-10-CM | POA: Diagnosis present

## 2021-06-09 DIAGNOSIS — R140 Abdominal distension (gaseous): Secondary | ICD-10-CM | POA: Insufficient documentation

## 2021-06-09 DIAGNOSIS — R778 Other specified abnormalities of plasma proteins: Secondary | ICD-10-CM

## 2021-06-09 DIAGNOSIS — R0601 Orthopnea: Secondary | ICD-10-CM | POA: Insufficient documentation

## 2021-06-09 DIAGNOSIS — I160 Hypertensive urgency: Secondary | ICD-10-CM | POA: Diagnosis not present

## 2021-06-09 LAB — CBC WITH DIFFERENTIAL/PLATELET
Abs Immature Granulocytes: 0.04 10*3/uL (ref 0.00–0.07)
Basophils Absolute: 0.1 10*3/uL (ref 0.0–0.1)
Basophils Relative: 1 %
Eosinophils Absolute: 0.2 10*3/uL (ref 0.0–0.5)
Eosinophils Relative: 2 %
HCT: 43.1 % (ref 39.0–52.0)
Hemoglobin: 14.4 g/dL (ref 13.0–17.0)
Immature Granulocytes: 0 %
Lymphocytes Relative: 22 %
Lymphs Abs: 2.2 10*3/uL (ref 0.7–4.0)
MCH: 32 pg (ref 26.0–34.0)
MCHC: 33.4 g/dL (ref 30.0–36.0)
MCV: 95.8 fL (ref 80.0–100.0)
Monocytes Absolute: 0.8 10*3/uL (ref 0.1–1.0)
Monocytes Relative: 8 %
Neutro Abs: 6.8 10*3/uL (ref 1.7–7.7)
Neutrophils Relative %: 67 %
Platelets: 324 10*3/uL (ref 150–400)
RBC: 4.5 MIL/uL (ref 4.22–5.81)
RDW: 12.3 % (ref 11.5–15.5)
WBC: 10.1 10*3/uL (ref 4.0–10.5)
nRBC: 0 % (ref 0.0–0.2)

## 2021-06-09 LAB — BRAIN NATRIURETIC PEPTIDE: B Natriuretic Peptide: 471.2 pg/mL — ABNORMAL HIGH (ref 0.0–100.0)

## 2021-06-09 LAB — COMPREHENSIVE METABOLIC PANEL
ALT: 51 U/L — ABNORMAL HIGH (ref 0–44)
AST: 44 U/L — ABNORMAL HIGH (ref 15–41)
Albumin: 3.6 g/dL (ref 3.5–5.0)
Alkaline Phosphatase: 71 U/L (ref 38–126)
Anion gap: 5 (ref 5–15)
BUN: 14 mg/dL (ref 6–20)
CO2: 26 mmol/L (ref 22–32)
Calcium: 8.5 mg/dL — ABNORMAL LOW (ref 8.9–10.3)
Chloride: 104 mmol/L (ref 98–111)
Creatinine, Ser: 0.99 mg/dL (ref 0.61–1.24)
GFR, Estimated: >60 mL/min (ref >60)
Glucose, Bld: 157 mg/dL — ABNORMAL HIGH (ref 70–99)
Potassium: 3.8 mmol/L (ref 3.5–5.1)
Sodium: 135 mmol/L (ref 135–145)
Total Bilirubin: 0.9 mg/dL (ref 0.3–1.2)
Total Protein: 6.9 g/dL (ref 6.5–8.1)

## 2021-06-09 LAB — RESP PANEL BY RT-PCR (FLU A&B, COVID) ARPGX2
Influenza A by PCR: NEGATIVE
Influenza B by PCR: NEGATIVE
SARS Coronavirus 2 by RT PCR: NEGATIVE

## 2021-06-09 LAB — MAGNESIUM: Magnesium: 1.9 mg/dL (ref 1.7–2.4)

## 2021-06-09 LAB — TROPONIN I (HIGH SENSITIVITY)
Troponin I (High Sensitivity): 38 ng/L — ABNORMAL HIGH (ref <18)
Troponin I (High Sensitivity): 40 ng/L — ABNORMAL HIGH (ref <18)

## 2021-06-09 LAB — LIPASE, BLOOD: Lipase: 57 U/L — ABNORMAL HIGH (ref 11–51)

## 2021-06-09 MED ORDER — ASPIRIN 81 MG PO CHEW
324.0000 mg | CHEWABLE_TABLET | Freq: Once | ORAL | Status: AC
  Administered 2021-06-09: 324 mg via ORAL
  Filled 2021-06-09: qty 4

## 2021-06-09 MED ORDER — FUROSEMIDE 10 MG/ML IJ SOLN
40.0000 mg | Freq: Once | INTRAMUSCULAR | Status: AC
  Administered 2021-06-09: 40 mg via INTRAVENOUS
  Filled 2021-06-09: qty 4

## 2021-06-09 MED ORDER — POTASSIUM CHLORIDE CRYS ER 20 MEQ PO TBCR
40.0000 meq | EXTENDED_RELEASE_TABLET | Freq: Once | ORAL | Status: AC
  Administered 2021-06-09: 40 meq via ORAL
  Filled 2021-06-09: qty 2

## 2021-06-09 MED ORDER — LISINOPRIL 10 MG PO TABS: 10.0000 mg | ORAL_TABLET | Freq: Every day | ORAL | 0 refills | Status: DC

## 2021-06-09 MED ORDER — FUROSEMIDE 20 MG PO TABS: 20.0000 mg | ORAL_TABLET | Freq: Every day | ORAL | 0 refills | Status: DC

## 2021-06-09 MED ORDER — LISINOPRIL 10 MG PO TABS
10.0000 mg | ORAL_TABLET | Freq: Every day | ORAL | Status: DC
  Administered 2021-06-09: 10 mg via ORAL
  Filled 2021-06-09: qty 1

## 2021-06-09 NOTE — ED Provider Notes (Signed)
Emergency Medicine Provider Triage Evaluation Note  JO CERONE , a 50 y.o. male  was evaluated in triage.  Pt complains of cough, chest pain, shortness of breath, high blood pressure out of medicine.  Review of Systems  Positive: Cough, chest pain, shortness of breath Negative: Vomiting or diarrhea  Physical Exam  Ht 6\' 2"  (1.88 m)   Wt 108.9 kg   BMI 30.81 kg/m  Gen:   Awake, no distress   Resp:  Normal effort  MSK:   Moves extremities without difficulty  Other:    Medical Decision Making  Medically screening exam initiated at 2:01 AM.  Appropriate orders placed.  GARION WEMPE was informed that the remainder of the evaluation will be completed by another provider, this initial triage assessment does not replace that evaluation, and the importance of remaining in the ED until their evaluation is complete.  50 year old male presenting with cough, chest pain and shortness of breath.  Will obtain EKG, cardiac panel, chest x-ray, respiratory panel while patient is awaiting a treatment room.   44, MD 06/09/21 0201

## 2021-06-09 NOTE — ED Triage Notes (Signed)
Patient ambulatory to triage with steady gait, without difficulty or distress noted; pt reports [facility] and abd/chest pain for several days accomp by HTN (out of meds for several wks)

## 2021-06-09 NOTE — ED Notes (Signed)
States diagnosed with a sinus infection 2 months ago, but was unable to afford the antibiotic.   Sinus congestion noted.

## 2021-06-09 NOTE — ED Provider Notes (Signed)
[facility] Emergency Department Provider Note  ____________________________________________   Event Date/Time   First MD Initiated Contact with Patient 06/09/21 0740     (approximate)  I have reviewed the triage vital signs and the nursing notes.   HISTORY  Chief Complaint Shortness of Breath   HPI Kirk Lyons is a 50 y.o. male with past medical history of gout, HTN, and obesity who presents for assessment approximately 1 month of worsening cough and shortness of breath.  Patient states he has also had some tightness in his lower middle chest and epigastric area.  He denies any new fevers, vomiting, diarrhea, burning urination, other abdominal pain, back pain, headache, earache, rash or extremity pain.  No recent falls or injuries.  He denies daily EtOH use or illicit drug use or tobacco abuse.  He states he has been out of all his medications for about a week.  He denies any history of heart failure.  He does endorse orthopnea.  No other acute concerns at this time.         Past Medical History:  Diagnosis Date   Gout    Hypertension     There are no problems to display for this patient.   Past Surgical History:  Procedure Laterality Date   HERNIA REPAIR      Prior to Admission medications   Medication Sig Start Date End Date Taking? Authorizing Provider  furosemide (LASIX) 20 MG tablet Take 1 tablet (20 mg total) by mouth daily for 7 days. 06/09/21 06/16/21 Yes Gilles Chiquito, MD  albuterol (VENTOLIN HFA) 108 (90 Base) MCG/ACT inhaler Inhale 2 puffs into the lungs every 6 (six) hours as needed for wheezing or shortness of breath. 06/06/20   Chesley Noon, MD  allopurinol (ZYLOPRIM) 100 MG tablet Take 1 tablet (100 mg total) by mouth daily. 08/04/20 08/04/21  Fisher, Roselyn Bering, PA-C  feeding supplement (ENSURE CLINICAL STRENGTH) LIQD Take 237 mLs by mouth daily as needed (nutrition).    [provider]  lisinopril (ZESTRIL) 10 MG  tablet Take 1 tablet (10 mg total) by mouth daily. 06/09/21 07/09/21  Gilles Chiquito, MD  Multiple Vitamins-Minerals (EMERGEN-C VITAMIN C) PACK Take 1 Package by mouth as needed (immune system support).    [provider]    Allergies Erythromycin  No family history on file.  Social History Social History   Tobacco Use   Smoking status: Some Days    Types: Cigarettes   Smokeless tobacco: Never  Vaping Use   Vaping Use: Never used  Substance Use Topics   Alcohol use: Yes    Alcohol/week: 3.0 standard drinks    Types: 3 Cans of beer per week    Comment: social   Drug use: No    Review of Systems  Review of Systems  Constitutional:  Negative for chills and fever.  HENT:  Negative for sore throat.   Eyes:  Negative for pain.  Respiratory:  Positive for cough and shortness of breath. Negative for stridor.   Cardiovascular:  Positive for chest pain, palpitations and orthopnea.  Gastrointestinal:  Positive for abdominal pain (epigastric). Negative for vomiting.  Genitourinary:  Negative for dysuria.  Musculoskeletal:  Negative for myalgias.  Skin:  Negative for rash.  Neurological:  Negative for seizures, loss of consciousness and headaches.  Psychiatric/Behavioral:  Negative for suicidal ideas.   All other systems reviewed and are negative.    ____________________________________________   PHYSICAL EXAM:  VITAL SIGNS: ED Triage Vitals  Enc Vitals Group     BP 06/09/21 0202 (!) 181/100     Pulse Rate 06/09/21 0202 (!) 101     Resp 06/09/21 0202 20     Temp 06/09/21 0202 97.7 F (36.5 C)     Temp Source 06/09/21 0202 Oral     SpO2 06/09/21 0420 96 %     Weight 06/09/21 0200 240 lb (108.9 kg)     Height 06/09/21 0200 6\' 2"  (1.88 m)     Head Circumference --      Peak Flow --      Pain Score 06/09/21 0200 8     Pain Loc --      Pain Edu? --      Excl. in GC? --    Vitals:   06/09/21 0420 06/09/21 0745  BP: (!) 174/136 (!) 167/128  Pulse: 100 95   Resp: 20 18  Temp:    SpO2: 96% 95%   Physical Exam Vitals and nursing note reviewed.  Constitutional:      General: He is not in acute distress.    Appearance: He is well-developed. He is ill-appearing.  HENT:     Head: Normocephalic and atraumatic.     Right Ear: External ear normal.     Left Ear: External ear normal.     Nose: Nose normal.  Eyes:     Conjunctiva/sclera: Conjunctivae normal.  Cardiovascular:     Rate and Rhythm: Normal rate and regular rhythm.     Heart sounds: No murmur heard. Pulmonary:     Effort: Pulmonary effort is normal. No respiratory distress.     Breath sounds: Rales present.  Abdominal:     General: There is distension.     Palpations: Abdomen is soft.     Tenderness: There is no abdominal tenderness.  Musculoskeletal:        General: No swelling.     Cervical back: Neck supple.     Right lower leg: Edema present.     Left lower leg: Edema present.  Skin:    General: Skin is warm and dry.     Capillary Refill: Capillary refill takes less than 2 seconds.  Neurological:     Mental Status: He is alert.  Psychiatric:        Mood and Affect: Mood normal.     ____________________________________________   LABS (all labs ordered are listed, but only abnormal results are displayed)  Labs Reviewed  COMPREHENSIVE METABOLIC PANEL - Abnormal; Notable for the following components:      Result Value   Glucose, Bld 157 (*)    Calcium 8.5 (*)    AST 44 (*)    ALT 51 (*)    All other components within normal limits  LIPASE, BLOOD - Abnormal; Notable for the following components:   Lipase 57 (*)    All other components within normal limits  BRAIN NATRIURETIC PEPTIDE - Abnormal; Notable for the following components:   B Natriuretic Peptide 471.2 (*)    All other components within normal limits  TROPONIN I (HIGH SENSITIVITY) - Abnormal; Notable for the following components:   Troponin I (High Sensitivity) 38 (*)    All other components within  normal limits  TROPONIN I (HIGH SENSITIVITY) - Abnormal; Notable for the following components:   Troponin I (High Sensitivity) 40 (*)    All other components within normal limits  RESP PANEL BY RT-PCR (FLU A&B, COVID) ARPGX2  CBC WITH DIFFERENTIAL/PLATELET  MAGNESIUM   ____________________________________________  EKG  ECG remarkable for sinus rhythm with a ventricular rate of 99, incomplete right bundle branch block, left anterior fascicle block and a QTc interval of 505 without other clear evidence of acute ischemia or significant arrhythmia. ____________________________________________  RADIOLOGY  ED MD interpretation: Chest x-ray shows some mild pulmonary edema without a focal consolidation, pneumothorax, large effusion or other clear acute thoracic process.  Official radiology report(s): DG Chest 2 View  Result Date: 06/09/2021 CLINICAL DATA:  Cough and shortness of breath EXAM: CHEST - 2 VIEW COMPARISON:  06/06/2020 FINDINGS: Cardiac shadow is enlarged increased from the prior exam. Lungs are well aerated bilaterally. Vascular congestion is seen with minimal interstitial edema. No sizable effusion is noted. No bony abnormality is seen. IMPRESSION: Changes consistent with mild CHF. Electronically Signed   By: Alcide Clever M.D.   On: 06/09/2021 02:24    ____________________________________________   PROCEDURES  Procedure(s) performed (including Critical Care):  Procedures   ____________________________________________   INITIAL IMPRESSION / ASSESSMENT AND PLAN / ED COURSE      Presents with above-stated history and exam for assessment of worsening shortness of breath and cough associate with some chest tightness and epigastric discomfort over the last month.  He has not been taking his blood pressure medicines for the past week.  On arrival he was hypertensive with BP of 191/100 and slight tachycardic at 101 with otherwise stable vital signs on room air.  On exam he does  appear grossly volume overloaded.  He is awake and alert with a nonfocal exam.  Differential includes acute CHF exacerbation possibly precipitated by uncontrolled blood pressure, ACS, arrhythmia, anemia, pneumonia, bronchitis and PE.  ECG remarkable for sinus rhythm with a ventricular rate of 99, incomplete right bundle branch block, left anterior fascicle block and a QTc interval of 505 without other clear evidence of acute ischemia or significant arrhythmia.  Troponin elevated but stable at 38 and 40.  This is most consistent with some demand ischemia with lower suspicion for occlusion MI at this time.  COVID and influenza PCR is negative.  Lipase not consistent with acute pancreatitis.  CMP without significant electrolyte or metabolic derangements.  K is 3.8.  CBC is unremarkable for leukocytosis or acute anemia.  Magnesium is within normal limits  Chest x-ray shows some mild pulmonary edema without a focal consolidation, pneumothorax, large effusion or other clear acute thoracic process.  Given absence of fever or leukocytosis or focal consolidation on chest x-ray I have low suspicion for bacterial pneumonia.  Impression is acute CHF possibly caused primarily by or exacerbated by uncontrolled blood pressure.  Patient given dose of his lisinopril and 40 IV Lasix.  I did give him some potassium to try to get it closer to 4 given I am diuresing him and he is slightly prolonged QTc interval.  Following this he was able to make significant urine state he was feeling much better.  Given significant provement symptoms I have a lower suspicion for PE.  Given patient has no known history of CHF strongly advised that he be admitted for observation and undergo an echocardiogram although he states he does not wish to stay for this as he needs to leave to see family.  Advised him that is more risky to diurese him outpatient since he does not have a cardiologist and his EKG is slightly abnormal states he  understands this and still is adamant that he wishes to leave against my advice.  I do believe he requires some further diuresis  and will write Rx for 20 mg of Lasix for the next couple days as well as refill for his lisinopril.  I think he has capacity to make his decision to leave against my advice understanding that he could deteriorate experience significant mortality or disability from a cardiac event or worsening CHF.  Discharged with instructions to take his lisinopril and Lasix and follow-up with CHF clinic and PCP.   ____________________________________________   FINAL CLINICAL IMPRESSION(S) / ED DIAGNOSES  Final diagnoses:  Acute congestive heart failure, unspecified heart failure type (HCC)  Troponin I above reference range  Hypertensive urgency  Medication refill    Medications  lisinopril (ZESTRIL) tablet 10 mg (10 mg Oral Given 06/09/21 0834)  potassium chloride SA (KLOR-CON) CR tablet 40 mEq (has no administration in time range)  furosemide (LASIX) injection 40 mg (40 mg Intravenous Given 06/09/21 0835)  aspirin chewable tablet 324 mg (324 mg Oral Given 06/09/21 0834)     ED Discharge Orders          Ordered    lisinopril (ZESTRIL) 10 MG tablet  Daily        06/09/21 0926    furosemide (LASIX) 20 MG tablet  Daily        06/09/21 0927    AMB referral to CHF clinic        06/09/21 9323             Note:  This document was prepared using Dragon voice recognition software and may include unintentional dictation errors.    Gilles Chiquito, MD 06/09/21 [phone]

## 2021-06-10 ENCOUNTER — Telehealth: Payer: Self-pay | Admitting: Family

## 2021-06-10 NOTE — Telephone Encounter (Signed)
LVM with patient in attempt to schedule him with a new patient appointment after he was seen in the ER this week for CHF.    Kirk Lyons, NT
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
## 2021-06-17 ENCOUNTER — Ambulatory Visit: Payer: 59 | Attending: Family | Admitting: Family

## 2021-06-17 ENCOUNTER — Other Ambulatory Visit: Payer: Self-pay

## 2021-06-17 ENCOUNTER — Encounter: Payer: Self-pay | Admitting: Family

## 2021-06-17 VITALS — BP 167/119 | HR 111 | Resp 18 | Ht 74.0 in | Wt 256.2 lb

## 2021-06-17 DIAGNOSIS — M109 Gout, unspecified: Secondary | ICD-10-CM | POA: Diagnosis not present

## 2021-06-17 DIAGNOSIS — I10 Essential (primary) hypertension: Secondary | ICD-10-CM

## 2021-06-17 DIAGNOSIS — G479 Sleep disorder, unspecified: Secondary | ICD-10-CM | POA: Insufficient documentation

## 2021-06-17 DIAGNOSIS — I5032 Chronic diastolic (congestive) heart failure: Secondary | ICD-10-CM

## 2021-06-17 DIAGNOSIS — I509 Heart failure, unspecified: Secondary | ICD-10-CM | POA: Diagnosis not present

## 2021-06-17 DIAGNOSIS — I110 Hypertensive heart disease with heart failure: Secondary | ICD-10-CM | POA: Diagnosis not present

## 2021-06-17 NOTE — Patient Instructions (Signed)
Continue weighing daily and call for an overnight weight gain of 3 pounds or more or a weekly weight gain of more than 5 pounds.  °

## 2021-06-20 ENCOUNTER — Telehealth: Payer: Self-pay | Admitting: Family

## 2021-06-20 ENCOUNTER — Other Ambulatory Visit: Payer: Self-pay

## 2021-06-20 ENCOUNTER — Other Ambulatory Visit: Payer: Self-pay | Admitting: Family

## 2021-06-20 ENCOUNTER — Ambulatory Visit
Admission: RE | Admit: 2021-06-20 | Discharge: 2021-06-20 | Disposition: A | Discharge status: Home / Self Care | Payer: 59 | Source: Ambulatory Visit | Attending: Family | Admitting: Family

## 2021-06-20 DIAGNOSIS — I5033 Acute on chronic diastolic (congestive) heart failure: Secondary | ICD-10-CM | POA: Diagnosis not present

## 2021-06-20 LAB — BASIC METABOLIC PANEL
Anion gap: 7 (ref 5–15)
BUN: 11 mg/dL (ref 6–20)
CO2: 26 mmol/L (ref 22–32)
Calcium: 8.5 mg/dL — ABNORMAL LOW (ref 8.9–10.3)
Chloride: 102 mmol/L (ref 98–111)
Creatinine, Ser: 0.99 mg/dL (ref 0.61–1.24)
GFR, Estimated: >60 mL/min (ref >60)
Glucose, Bld: 179 mg/dL — ABNORMAL HIGH (ref 70–99)
Potassium: 3.8 mmol/L (ref 3.5–5.1)
Sodium: 135 mmol/L (ref 135–145)

## 2021-06-20 LAB — BRAIN NATRIURETIC PEPTIDE: B Natriuretic Peptide: 494.9 pg/mL — ABNORMAL HIGH (ref 0.0–100.0)

## 2021-06-20 MED ORDER — POTASSIUM CHLORIDE CRYS ER 20 MEQ PO TBCR
EXTENDED_RELEASE_TABLET | ORAL | Status: AC
  Administered 2021-06-20: 40 meq via ORAL
  Filled 2021-06-20: qty 2

## 2021-06-20 MED ORDER — POTASSIUM CHLORIDE CRYS ER 20 MEQ PO TBCR: 40.0000 meq | EXTENDED_RELEASE_TABLET | Freq: Once | ORAL | Status: AC

## 2021-06-20 MED ORDER — FUROSEMIDE 10 MG/ML IJ SOLN
INTRAMUSCULAR | Status: AC
  Administered 2021-06-20: 80 mg via INTRAVENOUS
  Filled 2021-06-20: qty 8

## 2021-06-20 MED ORDER — FUROSEMIDE 10 MG/ML IJ SOLN: 80.0000 mg | Freq: Once | INTRAMUSCULAR | Status: AC

## 2021-06-20 NOTE — Telephone Encounter (Signed)
Patient called to say that his abdomen is becoming more swollen with abdominal pain and decreased appetite. Denies any pedal edema. Feels like his shortness of breath is "the same".   Admits to not weighing himself daily and says that he actually hasn't taken the scales out of the box. Continues to take the lisinopril and furosemide that was prescribed at his ED visit.   Will send for 80mg  IV lasix/ PO potassium today and keep his f/u currently scheduled with in 3 days. Instructed to start weighing himself daily and keep a log so that he can call us if he gains 3 pounds or more overnight or 5 pounds in a week.

## 2021-06-22 NOTE — Progress Notes (Signed)
Kirk Lyons ID: Kirk Lyons, male    DOB: [birth year], 50 y.o.   MRN: [id number]  HPI  Kirk Lyons is a 50 y/o male with a history of HTN, gout, tobacco use and chronic heart failure.   No echo has been done. Currently scheduled for 07/20/21  Was in the ED 06/09/21 due to shortness of breath and cough after being out of medications for ~ 1 week. Elevated troponin thought to be due to demand ischemia. Given IV lasix and admission recommended but Kirk Lyons declined. Symptoms improved and Kirk Lyons was released.   Kirk Lyons presents today for a follow-up visit with a chief complaint of moderate fatigue with little exertion. Kirk Lyons says that this has been present for several years. Kirk Lyons has associated cough, shortness of breath (better), palpitations, dizziness, anxiety and chronic difficulty sleeping along with this. Kirk Lyons denies any abdominal distention, pedal edema, chest pain or weight gain.   Received 80mg  IV lasix/ PO potassium 3 days ago and says that Kirk Lyons feels better since Kirk Lyons received that.   Has not taken any of his medications yet today as Kirk Lyons normally takes them around 10pm (it's 1:30pm now). Works night shift at . Currently living in a hotel with 2 lady friends.   Has been wearing a CPAP machine that a friend of his got him and Kirk Lyons says that it self-regulates, however, Kirk Lyons has never had a sleep study done. Kirk Lyons says that Kirk Lyons hasn't slept through a solid night in > 20 years as Kirk Lyons says that Kirk Lyons's afraid to go to sleep. Says that Kirk Lyons sleep eats, talks and that Kirk Lyons feels like his tongue is pushing so hard against his teeth that it almost hurts.  Past Medical History:  Diagnosis Date   CHF (congestive heart failure) (HCC)    Gout    Hypertension    Past Surgical History:  Procedure Laterality Date   HERNIA REPAIR     No family history on file. Social History   Tobacco Use   Smoking status: Some Days    Types: Cigarettes   Smokeless tobacco: Never  Substance Use Topics   Alcohol use: Yes     Alcohol/week: 3.0 standard drinks    Types: 3 Cans of beer per week    Comment: social   Allergies  Allergen Reactions   Erythromycin Nausea And Vomiting    vomiting vomiting   Prior to Admission medications   Medication Sig Start Date End Date Taking? Authorizing Provider  albuterol (VENTOLIN HFA) 108 (90 Base) MCG/ACT inhaler Inhale 2 puffs into the lungs every 6 (six) hours as needed for wheezing or shortness of breath. 06/06/20  Yes 06/08/20, MD  furosemide (LASIX) 20 MG tablet Take 1 tablet (20 mg total) by mouth daily for 7 days. 06/09/21  Yes 06/11/21, MD  lisinopril (ZESTRIL) 10 MG tablet Take 1 tablet (10 mg total) by mouth daily. 06/09/21 07/09/21 Yes 07/11/21, MD  Multiple Vitamins-Minerals (EMERGEN-C VITAMIN C) PACK Take 1 Package by mouth as needed (immune system support).   Yes [provider]  allopurinol (ZYLOPRIM) 100 MG tablet Take 1 tablet (100 mg total) by mouth daily. Kirk Lyons not taking: Reported on 06/17/2021 08/04/20 08/04/21  08/06/21, PA-C  colchicine 0.6 MG tablet Take 0.6 mg by mouth daily. Unsure about the dose Kirk Lyons not taking: Reported on 06/17/2021    [provider]  feeding supplement (ENSURE CLINICAL STRENGTH) LIQD Take 237 mLs by mouth daily as needed (nutrition). Kirk Lyons not  taking: Reported on 06/17/2021    [provider]    Review of Systems  Constitutional:  Positive for appetite change (decreased) and fatigue.  HENT:  Negative for congestion, postnasal drip and sore throat.   Eyes: Negative.   Respiratory:  Positive for cough and shortness of breath. Negative for chest tightness.   Cardiovascular:  Positive for palpitations. Negative for chest pain and leg swelling.  Gastrointestinal:  Negative for abdominal distention and abdominal pain.  Endocrine: Negative.   Genitourinary: Negative.   Musculoskeletal:  Negative for back pain and neck pain.  Skin: Negative.   Allergic/Immunologic:  Negative.   Neurological:  Positive for dizziness (at times). Negative for light-headedness.  Hematological:  Negative for adenopathy. Does not bruise/bleed easily.  Psychiatric/Behavioral:  Positive for sleep disturbance. Negative for dysphoric mood. The Kirk Lyons is nervous/anxious.    Vitals:   06/23/21 1325  BP: (!) 178/134  Pulse: (!) 102  Resp: 20  SpO2: 93%  Weight: 253 lb 4 oz (114.9 kg)  Height: 6' (1.829 m)   Wt Readings from Last 3 Encounters:  06/23/21 253 lb 4 oz (114.9 kg)  06/17/21 256 lb 4 oz (116.2 kg)  06/09/21 240 lb (108.9 kg)   Lab Results  Component Value Date   CREATININE 1.04 06/23/2021   CREATININE 0.99 06/20/2021   CREATININE 0.99 06/09/2021   Physical Exam Vitals and nursing note reviewed.  Constitutional:      General: Kirk Lyons is sleeping.     Appearance: Kirk Lyons is well-developed.  HENT:     Head: Normocephalic and atraumatic.  Neck:     Vascular: No JVD.  Cardiovascular:     Rate and Rhythm: Tachycardia present.  Pulmonary:     Effort: Pulmonary effort is normal.     Breath sounds: Examination of the right-upper field reveals rales. Examination of the left-upper field reveals rhonchi. Rhonchi and rales present. No wheezing.  Abdominal:     Palpations: Abdomen is soft.     Tenderness: There is no abdominal tenderness.  Musculoskeletal:     Cervical back: Normal range of motion and neck supple.     Right lower leg: No edema.     Left lower leg: No edema.  Skin:    General: Skin is warm and dry.  Neurological:     General: No focal deficit present.     Mental Status: Kirk Lyons is oriented to person, place, and time.  Psychiatric:        Mood and Affect: Mood is anxious.        Behavior: Behavior normal.   Assessment & Plan:  1: Chronic heart failure with unknown ejection fraction (although most likely preserved)- - NYHA class III although improved from last week - euvolemic today - not weighing daily; reminded to weigh daily so that Kirk Lyons can call for  an overnight weight gain of > 2 pounds or a weekly weight gain of >5 pounds - weight down 3 pounds from last visit here 1 week ago - got 80mg  IV lasix/ PO potassium 3 days ago - will check BMP/ BNP today; may benefit from more IV lasix depending on BNP results - not adding salt and tries to review food labels - sees cardiology ) 06/28/21 - echo is currently scheduled for 07/20/21 - even though there's no echo results, will go ahead and stop his lisinopril and begin entresto 24/26mg  BID; Kirk Lyons hasn't taken his lisinopril since 10pm last night so advised to not take it tonight and start the  entresto tomorrow as 1 tablet twice daily; should get better HTN control with the entresto - [employer] Kirk Lyons assistance forms filled out today - BNP 06/20/21 was 494.9  2: HTN- - BP elevated (178/134) but Kirk Lyons hasn't taken his medications yet today; says that Kirk Lyons takes them at 10pm - changing his lisinopril to entresto per above - has PCP appt scheduled for March 2023 - BMP 06/20/21 reviewed and showed sodium 135, potassium 3.8, creatinine 0.99 and GFR >60   3: Sleeping difficulties- - says that Kirk Lyons hasn't had a complete night sleep in ~ 20 years - not falling asleep in the exam room as Kirk Lyons was doing at previous visit - at home Kirk Lyons has done sleep eating and talking - wakes up with his tongue pushed tightly against his teeth and will sometimes wake up on the side of the bed - currently using a CPAP that someone ordered for him but sleep study has not been done; referral to the sleep lab made at last visit to rule out sleep apnea - denies tobacco or etoh except during the holiday time   Kirk Lyons did not bring his medications nor a list. Each medication was verbally reviewed with the Kirk Lyons and Kirk Lyons was encouraged to bring the bottles to every visit to confirm accuracy of list.   Return in 2 weeks or sooner for any questions/problems before then.

## 2021-06-23 ENCOUNTER — Encounter: Payer: Self-pay | Admitting: Family

## 2021-06-23 ENCOUNTER — Other Ambulatory Visit
Admission: RE | Admit: 2021-06-23 | Discharge: 2021-06-23 | Disposition: A | Discharge status: Home / Self Care | Payer: 59 | Source: Ambulatory Visit | Attending: Family | Admitting: Family

## 2021-06-23 ENCOUNTER — Other Ambulatory Visit: Payer: Self-pay

## 2021-06-23 ENCOUNTER — Ambulatory Visit (HOSPITAL_BASED_OUTPATIENT_CLINIC_OR_DEPARTMENT_OTHER): Payer: 59 | Admitting: Family

## 2021-06-23 VITALS — BP 178/134 | HR 102 | Resp 20 | Ht 72.0 in | Wt 253.2 lb

## 2021-06-23 DIAGNOSIS — I509 Heart failure, unspecified: Secondary | ICD-10-CM | POA: Insufficient documentation

## 2021-06-23 DIAGNOSIS — I5032 Chronic diastolic (congestive) heart failure: Secondary | ICD-10-CM

## 2021-06-23 DIAGNOSIS — I110 Hypertensive heart disease with heart failure: Secondary | ICD-10-CM | POA: Insufficient documentation

## 2021-06-23 DIAGNOSIS — I10 Essential (primary) hypertension: Secondary | ICD-10-CM

## 2021-06-23 DIAGNOSIS — F17210 Nicotine dependence, cigarettes, uncomplicated: Secondary | ICD-10-CM | POA: Insufficient documentation

## 2021-06-23 DIAGNOSIS — G479 Sleep disorder, unspecified: Secondary | ICD-10-CM

## 2021-06-23 LAB — BASIC METABOLIC PANEL
Anion gap: 7 (ref 5–15)
BUN: 11 mg/dL (ref 6–20)
CO2: 29 mmol/L (ref 22–32)
Calcium: 9 mg/dL (ref 8.9–10.3)
Chloride: 102 mmol/L (ref 98–111)
Creatinine, Ser: 1.04 mg/dL (ref 0.61–1.24)
GFR, Estimated: >60 mL/min (ref >60)
Glucose, Bld: 151 mg/dL — ABNORMAL HIGH (ref 70–99)
Potassium: 3.6 mmol/L (ref 3.5–5.1)
Sodium: 138 mmol/L (ref 135–145)

## 2021-06-23 LAB — BRAIN NATRIURETIC PEPTIDE: B Natriuretic Peptide: 756.8 pg/mL — ABNORMAL HIGH (ref 0.0–100.0)

## 2021-06-23 MED ORDER — SACUBITRIL-VALSARTAN 24-26 MG PO TABS: 1.0000 | ORAL_TABLET | Freq: Two times a day (BID) | ORAL | 3 refills | Status: DC

## 2021-06-23 MED ORDER — FUROSEMIDE 20 MG PO TABS: 20.0000 mg | ORAL_TABLET | Freq: Every day | ORAL | 5 refills | Status: DC

## 2021-06-23 NOTE — Patient Instructions (Addendum)
Continue weighing daily and call for an overnight weight gain of 3 pounds or more or a weekly weight gain of more than 5 pounds.    Do not take anymore lisinopril. Starting tomorrow, you will take entresto as 1 tablet in the morning and 1 tablet in the evening.    You can start taking melatonin as 1 tablet to help you sleep

## 2021-06-24 ENCOUNTER — Telehealth: Payer: Self-pay

## 2021-06-24 ENCOUNTER — Encounter: Payer: Self-pay | Admitting: Family

## 2021-06-24 NOTE — Telephone Encounter (Signed)
Patient stated he would rather increase oral furosemide at this time than another dose of IV lasix. Patient informed that he should double his current 20 MG furosemide daily to 40 MG daily, until next follow up appointment and to begin entresto today, per Clarisa Kindred, NP. Patient acknowledged and stated he will call if he has any symptoms or concerns.  Suanne Marker, RN

## 2021-06-24 NOTE — Telephone Encounter (Signed)
-----   Message from Delma Freeze, [place] sent at 06/24/2021  8:32 AM EST ----- Potassium and kidney function are normal. Blood test that shows if your symptoms are more related to heart failure is elevated (BNP) and is higher than last check 4 days ago. We can give IV lasix again or see if the entresto that was started yesterday helps your symptoms. Sherryll Burger will be continued either way.

## 2021-06-28 ENCOUNTER — Ambulatory Visit (HOSPITAL_BASED_OUTPATIENT_CLINIC_OR_DEPARTMENT_OTHER): Payer: 59 | Admitting: Cardiovascular Disease

## 2021-06-28 ENCOUNTER — Telehealth: Payer: Self-pay | Admitting: Family

## 2021-06-28 NOTE — Progress Notes (Deleted)
[facility] Initial Assessment:    Date:  06/28/2021   ID:  Kirk Lyons, DOB [birth year], MRN [id number]  PCP:  Patient, No Pcp Per (Inactive)  Cardiologist:  None  Nephrologist:  Referring MD: No ref. provider found   CC: Hypertension  History of Present Illness:    Kirk Lyons is a 50 y.o. male with a hx of hypertension, gout, and obesity here to establish care in the [facility].    Previous antihypertensives:  Secondary Causes of Hypertension  Medications/Herbal: OCP, steroids, stimulants, antidepressants, weight loss medication, immune suppressants, NSAIDs, sympathomimetics, alcohol, caffeine, licorice, ginseng, St. John's wort, chemo  Sleep Apnea Renal artery stenosis Hyperaldosteronism Hyper/hypothyroidism Pheochromocytoma: palpitations, tachycardia, headache, diaphoresis (plasma metanephrines) Cushing's syndrome: Cushingoid facies, central obesity, proximal muscle weakness, and ecchymoses, adrenal incidentaloma (cortisol) Coarctation of the aorta  Past Medical History:  Diagnosis Date   CHF (congestive heart failure) (HCC)    Gout    Hypertension     Past Surgical History:  Procedure Laterality Date   HERNIA REPAIR      Current Medications: No outpatient medications have been marked as taking for the 06/28/21 encounter (Appointment) with Chilton Si, MD.     Allergies:   Erythromycin   Social History   Socioeconomic History   Marital status: Single    Spouse name: Not on file   Number of children: Not on file   Years of education: Not on file   Highest education level: Not on file  Occupational History   Not on file  Tobacco Use   Smoking status: Some Days    Types: Cigarettes   Smokeless tobacco: Never  Vaping Use   Vaping Use: Never used  Substance and Sexual Activity   Alcohol use: Yes    Alcohol/week: 3.0 standard drinks    Types: 3 Cans of beer per week    Comment: social   Drug use:  No   Sexual activity: Not on file  Other Topics Concern   Not on file  Social History Narrative   Not on file   Social Determinants of Health   Financial Resource Strain: Not on file  Food Insecurity: Not on file  Transportation Needs: Not on file  Physical Activity: Not on file  Stress: Not on file  Social Connections: Not on file     Family History: The patient's ***family history is not on file.  ROS:   Please see the history of present illness.    *** All other systems reviewed and are negative.  EKGs/Labs/Other Studies Reviewed:    EKG:  EKG is *** ordered today.  The ekg ordered today demonstrates ***  Recent Labs: 06/09/2021: ALT 51; Hemoglobin 14.4; Magnesium 1.9; Platelets 324 06/23/2021: B Natriuretic Peptide 756.8; BUN 11; Creatinine, Ser 1.04; Potassium 3.6; Sodium 138   Recent Lipid Panel No results found for: CHOL, TRIG, HDL, CHOLHDL, VLDL, LDLCALC, LDLDIRECT  Physical Exam:   VS:  There were no vitals taken for this visit. , BMI There is no height or weight on file to calculate BMI. GENERAL:  Well appearing HEENT: Pupils equal round and reactive, fundi not visualized, oral mucosa unremarkable NECK:  No jugular venous distention, waveform within normal limits, carotid upstroke brisk and symmetric, no bruits, no thyromegaly LYMPHATICS:  No cervical adenopathy LUNGS:  Clear to auscultation bilaterally HEART:  RRR.  PMI not displaced or sustained,S1 and S2 within normal limits, no S3, no S4, no clicks, no rubs, *** murmurs ABD:  Flat, positive bowel sounds normal in frequency in pitch, no bruits, no rebound, no guarding, no midline pulsatile mass, no hepatomegaly, no splenomegaly EXT:  2 plus pulses throughout, no edema, no cyanosis no clubbing SKIN:  No rashes no nodules NEURO:  Cranial nerves II through XII grossly intact, motor grossly intact throughout PSYCH:  Cognitively intact, oriented to person place and time   ASSESSMENT/PLAN:    No  problem-specific Assessment & Plan notes found for this encounter.   Screening for Secondary Hypertension: { Click here to document screening for secondary causes of HTN  :[id number]}    Relevant Labs/Studies: Basic Labs Latest Ref Rng & Units 06/23/2021 06/20/2021 06/09/2021  Sodium 135 - 145 mmol/L 138 135 135  Potassium 3.5 - 5.1 mmol/L 3.6 3.8 3.8  Creatinine 0.61 - 1.24 mg/dL 5.39 7.67 3.41                       he consents to be monitored in our remote patient monitoring program through [employer].  he will track his blood pressure twice daily and understands that these trends will help [place] to adjust his medications as needed prior to his next appointment.  he *** interested in enrolling in the PREP exercise and nutrition program through the [facility].     Disposition:    FU with MD/PharmD in {gen number [id number]:[id number]} {Days to years:10300}    Medication Adjustments/Labs and Tests Ordered: Current medicines are reviewed at length with the patient today.  Concerns regarding medicines are outlined above.  No orders of the defined types were placed in this encounter.  No orders of the defined types were placed in this encounter.    Signed, Chilton Si, MD  06/28/2021 8:36 AM    [facility]

## 2021-06-28 NOTE — Telephone Encounter (Signed)
Patient has been scheduled for Sleep Study 07/21/2021.    Kirk Lyons, NT

## 2021-07-06 ENCOUNTER — Other Ambulatory Visit: Payer: Self-pay

## 2021-07-06 ENCOUNTER — Encounter: Payer: Self-pay | Admitting: Family

## 2021-07-06 ENCOUNTER — Ambulatory Visit: Payer: 59 | Attending: Family | Admitting: Family

## 2021-07-06 VITALS — BP 145/106 | HR 106 | Resp 18 | Ht 74.0 in | Wt 254.0 lb

## 2021-07-06 DIAGNOSIS — G479 Sleep disorder, unspecified: Secondary | ICD-10-CM | POA: Diagnosis not present

## 2021-07-06 DIAGNOSIS — M109 Gout, unspecified: Secondary | ICD-10-CM | POA: Insufficient documentation

## 2021-07-06 DIAGNOSIS — R053 Chronic cough: Secondary | ICD-10-CM | POA: Diagnosis not present

## 2021-07-06 DIAGNOSIS — I10 Essential (primary) hypertension: Secondary | ICD-10-CM | POA: Diagnosis not present

## 2021-07-06 DIAGNOSIS — I509 Heart failure, unspecified: Secondary | ICD-10-CM | POA: Diagnosis not present

## 2021-07-06 DIAGNOSIS — I110 Hypertensive heart disease with heart failure: Secondary | ICD-10-CM | POA: Insufficient documentation

## 2021-07-06 DIAGNOSIS — Z79899 Other long term (current) drug therapy: Secondary | ICD-10-CM | POA: Diagnosis not present

## 2021-07-06 DIAGNOSIS — Z87891 Personal history of nicotine dependence: Secondary | ICD-10-CM | POA: Diagnosis not present

## 2021-07-06 DIAGNOSIS — I5032 Chronic diastolic (congestive) heart failure: Secondary | ICD-10-CM

## 2021-07-06 LAB — BASIC METABOLIC PANEL
Anion gap: 8 (ref 5–15)
BUN: 12 mg/dL (ref 6–20)
CO2: 28 mmol/L (ref 22–32)
Calcium: 8.8 mg/dL — ABNORMAL LOW (ref 8.9–10.3)
Chloride: 97 mmol/L — ABNORMAL LOW (ref 98–111)
Creatinine, Ser: 1.05 mg/dL (ref 0.61–1.24)
GFR, Estimated: >60 mL/min (ref >60)
Glucose, Bld: 156 mg/dL — ABNORMAL HIGH (ref 70–99)
Potassium: 4.1 mmol/L (ref 3.5–5.1)
Sodium: 133 mmol/L — ABNORMAL LOW (ref 135–145)

## 2021-07-06 LAB — BRAIN NATRIURETIC PEPTIDE: B Natriuretic Peptide: 160.6 pg/mL — ABNORMAL HIGH (ref 0.0–100.0)

## 2021-07-06 MED ORDER — CARVEDILOL 3.125 MG PO TABS: 3.1250 mg | ORAL_TABLET | Freq: Two times a day (BID) | ORAL | 3 refills | Status: DC

## 2021-07-06 NOTE — Progress Notes (Signed)
Patient ID: Kirk Lyons, male    DOB: 02-03-1971, 50 y.o.   MRN: [id number]  HPI  Kirk Lyons is a 50 y/o male with a history of HTN, gout, tobacco use and chronic heart failure.   Echo currently scheduled for 07/20/21  Was in the ED 06/09/21 due to shortness of breath and cough after being out of medications for ~ 1 week. Elevated troponin thought to be due to demand ischemia. Given IV lasix and admission recommended but patient declined. Symptoms improved and he was released.   He presents today for a follow-up visit with a chief complaint of minimal fatigue upon moderate exertion. He describes this as chronic in nature having been present for "quite awhile". He has associated dry cough, palpitations, dizziness, anxiety and difficulty sleeping along with this. He denies any shortness of breath, chest pain, pedal edema, abdominal distention or weight gain.   Says that he feels like his cough has worsened since starting entresto but he feels so much better that he isn't interested in stopping it.   Has been wearing a CPAP machine that a friend of his got him and he says that it self-regulates, however, patient has never had a sleep study done. He says that he hasn't slept through a solid night in > 20 years as he says that he's afraid to go to sleep. Says that he sleep eats, talks and that he feels like his tongue is pushing so hard against his teeth that it almost hurts.  Past Medical History:  Diagnosis Date   CHF (congestive heart failure) (HCC)    Gout    Hypertension    Past Surgical History:  Procedure Laterality Date   HERNIA REPAIR     No family history on file. Social History   Tobacco Use   Smoking status: Some Days    Types: Cigarettes   Smokeless tobacco: Never  Substance Use Topics   Alcohol use: Yes    Alcohol/week: 3.0 standard drinks    Types: 3 Cans of beer per week    Comment: social   Allergies  Allergen Reactions   Erythromycin Nausea And Vomiting     vomiting vomiting   Prior to Admission medications   Medication Sig Start Date End Date Taking? Authorizing Provider  albuterol (VENTOLIN HFA) 108 (90 Base) MCG/ACT inhaler Inhale 2 puffs into the lungs every 6 (six) hours as needed for wheezing or shortness of breath. 06/06/20  Yes Chesley Noon, MD  furosemide (LASIX) 20 MG tablet Take 1 tablet (20 mg total) by mouth daily. 06/23/21 07/23/21 Yes Randee Huston A, FNP  sacubitril-valsartan (ENTRESTO) 24-26 MG Take 1 tablet by mouth 2 (two) times daily. 06/23/21  Yes Natelie Ostrosky, Inetta Fermo A, FNP  allopurinol (ZYLOPRIM) 100 MG tablet Take 1 tablet (100 mg total) by mouth daily. Patient not taking: Reported on 06/17/2021 08/04/20 08/04/21  Faythe Ghee, PA-C  Multiple Vitamins-Minerals (EMERGEN-C VITAMIN C) PACK Take 1 Package by mouth as needed (immune system support).    [provider]   Review of Systems  Constitutional:  Positive for appetite change (decreased) and fatigue.  HENT:  Negative for congestion, postnasal drip and sore throat.   Eyes: Negative.   Respiratory:  Positive for cough. Negative for chest tightness and shortness of breath.   Cardiovascular:  Positive for palpitations. Negative for chest pain and leg swelling.  Gastrointestinal:  Negative for abdominal distention and abdominal pain.  Endocrine: Negative.   Genitourinary: Negative.   Musculoskeletal:  Negative  for back pain and neck pain.  Skin: Negative.   Allergic/Immunologic: Negative.   Neurological:  Positive for dizziness (at times). Negative for light-headedness.  Hematological:  Negative for adenopathy. Does not bruise/bleed easily.  Psychiatric/Behavioral:  Positive for sleep disturbance. Negative for dysphoric mood. The patient is nervous/anxious.    Vitals:   07/06/21 1304  BP: (!) 145/106  Pulse: (!) 106  Resp: 18  SpO2: 95%  Weight: 254 lb (115.2 kg)  Height: 6\' 2"  (1.88 m)   Wt Readings from Last 3 Encounters:  07/06/21 254 lb (115.2 kg)   06/23/21 253 lb 4 oz (114.9 kg)  06/17/21 256 lb 4 oz (116.2 kg)   Lab Results  Component Value Date   CREATININE 1.05 07/06/2021   CREATININE 1.04 06/23/2021   CREATININE 0.99 06/20/2021   Physical Exam Vitals and nursing note reviewed.  Constitutional:      General: He is sleeping.     Appearance: He is well-developed.  HENT:     Head: Normocephalic and atraumatic.  Neck:     Vascular: No JVD.  Cardiovascular:     Rate and Rhythm: Tachycardia present.  Pulmonary:     Effort: Pulmonary effort is normal.     Breath sounds: No wheezing, rhonchi or rales.  Abdominal:     Palpations: Abdomen is soft.     Tenderness: There is no abdominal tenderness.  Musculoskeletal:     Cervical back: Normal range of motion and neck supple.     Right lower leg: No edema.     Left lower leg: No edema.  Skin:    General: Skin is warm and dry.  Neurological:     General: No focal deficit present.     Mental Status: He is oriented to person, place, and time.  Psychiatric:        Mood and Affect: Mood is anxious.        Behavior: Behavior normal.   Assessment & Plan:  1: Chronic heart failure with unknown ejection fraction (although most likely preserved)- - NYHA class II  - euvolemic today - not weighing daily; reminded to weigh daily so that he can call for an overnight weight gain of > 2 pounds or a weekly weight gain of >5 pounds - weight unchanged from last visit here 2 weeks ago - not adding salt and tries to review food labels - sees cardiology 14/11/2020) 08/16/21 - echo is currently scheduled for 07/20/21 - will start carvedilol 3.125mg  BID due to continued tachycardia - he is not interested in stopping entresto to see if cough improves as his breathing has improved considerably since starting it - BMP/BNP to be drawn today - BNP 06/20/21 was 494.9  2: HTN- - BP elevated but slightly improved from last visit; adding carvedilol per above - has PCP appt scheduled for March 2023 -  BMP 06/20/21 reviewed and showed sodium 135, potassium 3.8, creatinine 0.99 and GFR >60   3: Sleeping difficulties- - says that he hasn't had a complete night sleep in ~ 20 years - at home he has done sleep eating and talking - wakes up with his tongue pushed tightly against his teeth and will sometimes wake up on the side of the bed - currently using a CPAP that someone ordered for him  - denies tobacco or etoh except during the holiday time - sleep test scheduled for 07/21/21   Medication bottles reviewed.   Return in 1 month or sooner for any questions/problems before then.

## 2021-07-06 NOTE — Patient Instructions (Addendum)
Continue weighing daily and call for an overnight weight gain of 3 pounds or more or a weekly weight gain of more than 5 pounds.    Begin taking carvedilol as 1 tablet twice a day. Take it when you take your entresto

## 2021-07-08 ENCOUNTER — Encounter (HOSPITAL_BASED_OUTPATIENT_CLINIC_OR_DEPARTMENT_OTHER): Payer: Self-pay

## 2021-07-12 ENCOUNTER — Encounter (HOSPITAL_BASED_OUTPATIENT_CLINIC_OR_DEPARTMENT_OTHER): Payer: Self-pay | Admitting: Cardiovascular Disease

## 2021-07-12 ENCOUNTER — Other Ambulatory Visit: Payer: Self-pay

## 2021-07-12 ENCOUNTER — Ambulatory Visit (INDEPENDENT_AMBULATORY_CARE_PROVIDER_SITE_OTHER): Payer: 59 | Admitting: Cardiovascular Disease

## 2021-07-12 VITALS — BP 132/89 | HR 93 | Ht 74.0 in | Wt 258.0 lb

## 2021-07-12 DIAGNOSIS — I5032 Chronic diastolic (congestive) heart failure: Secondary | ICD-10-CM

## 2021-07-12 DIAGNOSIS — I152 Hypertension secondary to endocrine disorders: Secondary | ICD-10-CM | POA: Insufficient documentation

## 2021-07-12 DIAGNOSIS — E669 Obesity, unspecified: Secondary | ICD-10-CM | POA: Insufficient documentation

## 2021-07-12 DIAGNOSIS — I10 Essential (primary) hypertension: Secondary | ICD-10-CM

## 2021-07-12 DIAGNOSIS — E66811 Obesity, class 1: Secondary | ICD-10-CM

## 2021-07-12 DIAGNOSIS — G4733 Obstructive sleep apnea (adult) (pediatric): Secondary | ICD-10-CM | POA: Diagnosis not present

## 2021-07-12 DIAGNOSIS — R7309 Other abnormal glucose: Secondary | ICD-10-CM | POA: Diagnosis not present

## 2021-07-12 DIAGNOSIS — I509 Heart failure, unspecified: Secondary | ICD-10-CM | POA: Diagnosis not present

## 2021-07-12 DIAGNOSIS — I5042 Chronic combined systolic (congestive) and diastolic (congestive) heart failure: Secondary | ICD-10-CM | POA: Insufficient documentation

## 2021-07-12 DIAGNOSIS — Z720 Tobacco use: Secondary | ICD-10-CM

## 2021-07-12 DIAGNOSIS — F439 Reaction to severe stress, unspecified: Secondary | ICD-10-CM

## 2021-07-12 HISTORY — DX: Obesity, unspecified: E66.9

## 2021-07-12 HISTORY — DX: Chronic diastolic (congestive) heart failure: I50.32

## 2021-07-12 HISTORY — DX: Tobacco use: Z72.0

## 2021-07-12 HISTORY — DX: Chronic combined systolic (congestive) and diastolic (congestive) heart failure: I50.42

## 2021-07-12 HISTORY — DX: Heart failure, unspecified: I50.9

## 2021-07-12 HISTORY — DX: Obesity, class 1: E66.811

## 2021-07-12 HISTORY — DX: Essential (primary) hypertension: I10

## 2021-07-12 HISTORY — DX: Obstructive sleep apnea (adult) (pediatric): G47.33

## 2021-07-12 MED ORDER — EMPAGLIFLOZIN 10 MG PO TABS: 10.0000 mg | ORAL_TABLET | Freq: Every day | ORAL | 1 refills | Status: DC

## 2021-07-12 NOTE — Assessment & Plan Note (Addendum)
Blood pressure is improved significantly on carvedilol and Entresto.  He is tolerating them well both at very low doses.  He has not taken his medication yet today.  He is going to work on trying to take it more consistently 12 hours apart.  We have given him an advance hypertension booklet and asked him to track his blood pressures twice daily and bring to follow-up.  His father has a blood pressure cuff that he can use.  He has many social stressors and is currently living in a hotel.  He also works at a [employer] and gets many of his food items from there.  We discussed the fact that although he tries to eat healthy if he is eating at the restaurant he is likely having too much salt intake.  He will continue trying to bring salads and other healthy food options to work for lunch.  We will also have our social work team reach out to see if there is any way we can assist.  He did note transportation difficulties.

## 2021-07-12 NOTE — Progress Notes (Signed)
Heart and Vascular Care Navigation  07/12/2021  Kirk Lyons July 26, 1970 [id number]  Reason for Referral:  Engaged with patient face to face for initial visit for Heart and Vascular Care Coordination.                                                                                                   Assessment:                                     LCSW met with pt at end of his appt today at [facility]. Introduced self, role, reason for visit. I had previously attempted x4 to get in touch with pt and left multiple voicemails. Pt states he had received these but "couldn't hear them," "does not do voicemail" and "didn't know what they were about" so he did not return them.   He returns today w/ insurance coverage. Confirmed his mailing address remains the listed address in [place] (his parents address) and he can get mail there. He is currently staying at the [facility] in [place] ([address]), with two friends. Pt has had challenges w/ housing (mentions previous eviction and challenges w/ COVID pandemic). Finances are tight, he currently does not receive any assistance such as SNAP or disability and states that he pays for the majority of the bills for him and his roommates. 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 He ideally would like to live on his own without roommates. I discussed that housing waiting lists are often months-years long but that there are housing advocates int the community that could work with him to better understand what his finances are and options he may have. See below for additional resources provided.   HRT/VAS Care Coordination     Patients Home Cardiology Office --  [place]   Outpatient Care Team Social Worker   Social Worker Name: Valeda Malm, [place] [facility] [phone]   Living arrangements for the past 2 months Hotel/Motel   Lives with: Friends   Patient Has Concern With  Paying Medical Bills No   Does Patient Have Prescription Coverage? Yes   Home Assistive Devices/Equipment CPAP       Social History:                                                                             SDOH Screenings   Alcohol Screen: Not on file  Depression (PHQ2-9): Low Risk    PHQ-2 Score: 0  Financial Resource Strain: High Risk   Difficulty of Paying Living Expenses: Very hard  Food Insecurity: No Food Insecurity   Worried About Running Out of Food in the Last Year: Never true  Ran Out of Food in the Last Year: Never true  Housing: Medium Risk   Last Housing Risk Score: 1  Physical Activity: Unknown   Days of Exercise per Week: 5 days   Minutes of Exercise per Session: Not on file  Social Connections: Socially Isolated   Frequency of Communication with Friends and Family: More than three times a week   Frequency of Social Gatherings with Friends and Family: Once a week   Attends Religious Services: Never   Marine scientist or Organizations: No   Attends Music therapist: Never   Marital Status: Never married  Stress: Stress Concern Present   Feeling of Stress : Very much  Tobacco Use: High Risk   Smoking Tobacco Use: Some Days   Smokeless Tobacco Use: Never   Passive Exposure: Not on file  Transportation Needs: Unmet Transportation Needs   Lack of Transportation (Medical): No   Lack of Transportation (Non-Medical): Yes    SDOH Interventions: Financial Resources:  Financial Strain Interventions: Other (Comment) (DSS; [employer] information; Administrator, arts) DSS for financial assistance  Food Insecurity:  Food Insecurity Interventions: Other (Comment) (DSS for [employer] application; food bank list [place] bank/kitchen for [employer])  Housing Insecurity:  Housing Interventions: Other (Comment) (provided him w/ information about Art therapist; who to reach out to regarding Section 8  application; DSS for financial assistance w/ payments if housing located)  Transportation:   Transportation Interventions: [employer]    Follow-up plan:   LCSW shared the following resources: [employer] crisis housing line, discussed information about how to contact housing authority regarding Section 8 and other programs. I shared how to access shelter if needed (pt declines as he states he would have places to go if needed). I also provided food resources for [employer]. I provided information about how to apply for SNAP which could help also with accessing more healthy food options and the [place] DSS contact information which he took and expressed some misconceptions about eligibility for assistance programs. I encouraged him to reach out to them to see if he was eligible for SNAP or any additional resources to help with housing etc. Pt does not drive per his report bc of his sleep apnea and sold his car. He gets transportation through his roommate, I will enroll him in [place] and I encouraged him to let us know if he ever needs a ride to an appt. I remain available as needed- I will text pt as agreed next week to see if any additional questions/needs.

## 2021-07-12 NOTE — Progress Notes (Signed)
Advanced Hypertension Clinic Initial Assessment:    Date:  07/12/2021   ID:  Kirk Lyons, DOB 11/11/1970, MRN [id number]  PCP:  Patient, No Pcp Per (Inactive)  Cardiologist:  None  Nephrologist:  Referring MD: No ref. provider found   CC: Hypertension  History of Present Illness:    Kirk Lyons is a 50 y.o. male with a hx of hypertension, chronic heart failure, gout, and tobacco use, here to establish care in the Advanced Hypertension Clinic. Previously he was in the ED 04/27/2021 after falling out of bed in the AM and striking his right-sided face on furniture. He reported having a CPAP but not wearing it, and that he frequently fell out of bed due to this. Kirk Lyons was seen in the ED 06/09/21 following 1 month of worsening shortness of breath and cough. He also reported lower middle chest and epigastric tightness, and orthopnea. He noted being out of all his medications for about a week. EKG showed SR, rate 99, iRBBB, LAFB and a QTc interval of 505 without other clear evidence of acute ischemia or significant arrhythmia. CXR revealed mild pulmonary edema. He was hypertensive on arrival with BP 191/100 (HR 101) on room air. Elevated troponin at 38 and 40 thought to be due to demand ischemia. He was given IV lasix and admission was recommended, but he declined. His symptoms improved and he was discharged with 20 mg Lasix for a few days and continuing his lisinopril. Lisinopril was later switched to [facility] by Ms. Hackney. He was last seen in Cardiology by Clarisa Kindred, FNP 07/06/21. His blood pressure at that visit was 145/106, HR 106. He reported a worsening cough since beginning Entresto, but did not wish to stop it as his breathing was improving significantly. He has not had a sleep study, but has been wearing a CPAP that reportedly self-regulates given to him by a friend. Also noted sleep eating and talking, and pain from his tongue pushing hard against his teeth. Carvedilol 3.125 mg  BID was started due to continued tachycardia. He is scheduled for an Echo on 07/20/21. A sleep study was scheduled for 07/21/21.   Today, he is feeling good overall. He reports his blood pressure has been high for years, initially treated with medication about 6-8 months prior to his ED visit in 11/22. First he was started on amlodipine, and then switched to lisinopril. He was then switched to Entresto less than 2 weeks ago, and noticed improvement overnight. He was able to climb stairs without becoming short of breath. At home he does not monitor his blood pressure. He wakes up at 4 AM, and takes his first dose of antihypertensives. His second dose is usually around 2 PM, but tries to take it closer to 4 PM. Lately, his breathing is good, aside from at night mixed with his sleep apnea. He is fearful of sleeping. He typically sleeps with his head propped up, but may wake up in various positions and dyspneic. By the time he wakes up his CPAP machine is usually disconnected. He is very fatigued most of the time. He works second and third shifts. He would fall asleep standing up and noted significant LE edema in his feet. During a family function he will fall asleep while sitting shortly after eating. Additionally, he notes having tingling and numbness in his upper Left LE. He does not formally exercise, but works 10-hour shifts at [employer]. He does well watching his diet, and grew up on a vegetarian  diet. He will eat salads at work, and often has cereals and fruits. Only adds salt to eggs and potatoes. He does not drink caffeine. He uses OTC probiotics. When he has a beer about once a month he will also have a cigarette or two. He was a regular smoker up to about 10 years ago. He denies any palpitations, chest pain, lightheadedness, headaches, or syncope.  Previous antihypertensives: Amlodipine Lisinopril   Past Medical History:  Diagnosis Date   CHF (congestive heart failure) (HCC)    Essential hypertension  07/12/2021   Gout    Heart failure, type unknown (HCC) 07/12/2021   Hypertension    Obesity (BMI 30.0-34.9) 07/12/2021   OSA (obstructive sleep apnea) 07/12/2021   Tobacco use 07/12/2021    Past Surgical History:  Procedure Laterality Date   HERNIA REPAIR      Current Medications: Current Meds  Medication Sig   albuterol (VENTOLIN HFA) 108 (90 Base) MCG/ACT inhaler Inhale 2 puffs into the lungs every 6 (six) hours as needed for wheezing or shortness of breath.   carvedilol (COREG) 3.125 MG tablet Take 1 tablet (3.125 mg total) by mouth 2 (two) times daily.   colchicine 0.6 MG tablet Take 2 tablets (1.2mg ) by mouth at first sign of gout flare followed by 1 tablet (0.6mg ) after 1 hour. (Max 1.8mg  within 1 hour)   empagliflozin (JARDIANCE) 10 MG TABS tablet Take 1 tablet (10 mg total) by mouth daily before breakfast.   furosemide (LASIX) 20 MG tablet Take 1 tablet (20 mg total) by mouth daily.   Multiple Vitamins-Minerals (EMERGEN-C VITAMIN C) PACK Take 1 Package by mouth as needed (immune system support).   sacubitril-valsartan (ENTRESTO) 24-26 MG Take 1 tablet by mouth 2 (two) times daily.     Allergies:   Erythromycin   Social History   Socioeconomic History   Marital status: Single    Spouse name: Not on file   Number of children: Not on file   Years of education: Not on file   Highest education level: Not on file  Occupational History   Not on file  Tobacco Use   Smoking status: Some Days    Types: Cigarettes   Smokeless tobacco: Never   Tobacco comments:    May smoke a cigarette if he has a beer  Vaping Use   Vaping Use: Never used  Substance and Sexual Activity   Alcohol use: Yes    Alcohol/week: 3.0 standard drinks    Types: 3 Cans of beer per week    Comment: social   Drug use: No   Sexual activity: Not on file  Other Topics Concern   Not on file  Social History Narrative   Not on file   Social Determinants of Health   Financial Resource Strain: High  Risk   Difficulty of Paying Living Expenses: Very hard  Food Insecurity: No Food Insecurity   Worried About Programme researcher, broadcasting/film/video in the Last Year: Never true   Barista in the Last Year: Never true  Transportation Needs: Unmet Transportation Needs   Lack of Transportation (Medical): No   Lack of Transportation (Non-Medical): Yes  Physical Activity: Unknown   Days of Exercise per Week: 5 days   Minutes of Exercise per Session: Not on file  Stress: Stress Concern Present   Feeling of Stress : Very much  Social Connections: Socially Isolated   Frequency of Communication with Friends and Family: More than three times a week  Frequency of Social Gatherings with Friends and Family: Once a week   Attends Religious Services: Never   Database administrator or Organizations: No   Attends Engineer, structural: Never   Marital Status: Never married     Family History: The patient's family history includes Breast cancer in his mother; Coronary artery disease in his father; Hyperlipidemia in his father; Prostate cancer in his father; Testicular cancer in his brother.  ROS:   Please see the history of present illness.    (+) Fatigue (+) Daytime somnolence (+) Orthopnea (+) Bilateral LE edema (+) Tingling/Numbness in upper left LE All other systems reviewed and are negative.  EKGs/Labs/Other Studies Reviewed:    DG CXR 06/09/2021: COMPARISON:  06/06/2020   FINDINGS: Cardiac shadow is enlarged increased from the prior exam. Lungs are well aerated bilaterally. Vascular congestion is seen with minimal interstitial edema. No sizable effusion is noted. No bony abnormality is seen.   IMPRESSION: Changes consistent with mild CHF.  EKG:   07/12/2021: Sinus rhythm. Rate 96 bpm. iRBBB. Indeterminate axis, cannot rule out prior inferior infarct  Recent Labs: 06/09/2021: ALT 51; Hemoglobin 14.4; Magnesium 1.9; Platelets 324 07/06/2021: B Natriuretic Peptide 160.6; BUN 12;  Creatinine, Ser 1.05; Potassium 4.1; Sodium 133   Recent Lipid Panel No results found for: CHOL, TRIG, HDL, CHOLHDL, VLDL, LDLCALC, LDLDIRECT  Physical Exam:    VS:  BP 132/89 (BP Location: Left Arm, Patient Position: Sitting, Cuff Size: Large)    Pulse 93    Ht 6\' 2"  (1.88 m)    Wt 258 lb (117 kg)    SpO2 92%    BMI 33.13 kg/m  , BMI Body mass index is 33.13 kg/m. GENERAL:  Well appearing HEENT: Pupils equal round and reactive, fundi not visualized, oral mucosa unremarkable NECK:  No jugular venous distention, waveform within normal limits, carotid upstroke brisk and symmetric, no bruits LUNGS:  Clear to auscultation bilaterally HEART:  RRR.  PMI not displaced or sustained,S1 and S2 within normal limits, no S3, no S4, no clicks, no rubs, no murmurs ABD:  Flat, positive bowel sounds normal in frequency in pitch, no bruits, no rebound, no guarding, no midline pulsatile mass, no hepatomegaly, no splenomegaly EXT:  2 plus pulses throughout, no edema, no cyanosis no clubbing SKIN:  No rashes no nodules NEURO:  Cranial nerves II through XII grossly intact, motor grossly intact throughout PSYCH:  Cognitively intact, oriented to person place and time   ASSESSMENT/PLAN:    Heart failure, type unknown (HCC) BNP elevated in the hospital.  He presented with heart failure symptoms and has improved significantly with Entresto, Lasix and carvedilol.  He is already set up to get an echo next week.  We will check a CMP given that he has started on these new medications.  Add Jardiance 10 mg daily.  OSA (obstructive sleep apnea) He has known sleep apnea and a sleep study is pending.  He notes that he pulls off the nasal pillows mask and wants to get the Inspire device if possible.  Encouraged him to keep using the CPAP as much as possible for now.  Essential hypertension Blood pressure is improved significantly on carvedilol and Entresto.  He is tolerating them well both at very low doses.  He has not  taken his medication yet today.  He is going to work on trying to take it more consistently 12 hours apart.  We have given him an advance hypertension booklet and asked him to track  his blood pressures twice daily and bring to follow-up.  His father has a blood pressure cuff that he can use.  He has many social stressors and is currently living in a hotel.  He also works at a [employer] and gets many of his food items from there.  We discussed the fact that although he tries to eat healthy if he is eating at the restaurant he is likely having too much salt intake.  He will continue trying to bring salads and other healthy food options to work for lunch.  We will also have our social work team reach out to see if there is any way we can assist.  He did note transportation difficulties.  Obesity (BMI 30.0-34.9) Check fasting lipids and hemoglobin A1c.  He notes that he gets a lot of exercise walking at work.  At follow-up we will discuss more specific exercise recommendations and consider the PREP program if he is amenable.  Tobacco use He has significantly limited his tobacco use to one or twice per month when he has EtOH.  Encouraged cessation.   Screening for Secondary Hypertension:  Causes 07/12/2021  Drugs/Herbals Screened     - Comments limits sodium intake as best he can    Relevant Labs/Studies: Basic Labs Latest Ref Rng & Units 07/06/2021 06/23/2021 06/20/2021  Sodium 135 - 145 mmol/L 133(L) 138 135  Potassium 3.5 - 5.1 mmol/L 4.1 3.6 3.8  Creatinine 0.61 - 1.24 mg/dL 2.83 1.51 7.61                    Disposition:    FU with APP in 1 month.   FU with Daiel Strohecker C. Duke Salvia, MD, [facility] in 4 months.  Medication Adjustments/Labs and Tests Ordered: Current medicines are reviewed at length with the patient today.  Concerns regarding medicines are outlined above.   Orders Placed This Encounter  Procedures   Lipid panel   Comprehensive metabolic panel   HgB A1c   Ambulatory  referral to Social Work   EKG 12-Lead   Meds ordered this encounter  Medications   empagliflozin (JARDIANCE) 10 MG TABS tablet    Sig: Take 1 tablet (10 mg total) by mouth daily before breakfast.    Dispense:  90 tablet    Refill:  1   I,Mathew Stumpf,acting as a scribe for Chilton Si, MD.,have documented all relevant documentation on the behalf of Chilton Si, MD,as directed by  Chilton Si, MD while in the presence of Chilton Si, MD.  I, Nikita Humble C. Duke Salvia, MD have reviewed all documentation for this visit.  The documentation of the exam, diagnosis, procedures, and orders on 07/12/2021 are all accurate and complete.   Signed, Chilton Si, MD  07/12/2021 10:21 AM    [facility]

## 2021-07-12 NOTE — Assessment & Plan Note (Signed)
He has known sleep apnea and a sleep study is pending.  He notes that he pulls off the nasal pillows mask and wants to get the Inspire device if possible.  Encouraged him to keep using the CPAP as much as possible for now.

## 2021-07-12 NOTE — Assessment & Plan Note (Signed)
He has significantly limited his tobacco use to one or twice per month when he has EtOH.  Encouraged cessation.

## 2021-07-12 NOTE — Assessment & Plan Note (Signed)
Check fasting lipids and hemoglobin A1c.  He notes that he gets a lot of exercise walking at work.  At follow-up we will discuss more specific exercise recommendations and consider the PREP program if he is amenable.

## 2021-07-12 NOTE — Patient Instructions (Addendum)
Medication Instructions:  START JARDIANCE 10 MG DAILY    Labwork: LIPID/A1C/CMET TODAY    Testing/Procedures: NONE   Follow-Up: 08/10/2021 11:20 AM WITH JESSE AT [facility]  Special Instructions:   MONITOR YOUR BLOOD PRESSURE TWICE A DAY, LOG IN THE BOOK PROVIDED. BRING THE BOOK AND YOUR BLOOD PRESSURE MACHINE TO YOUR FOLLOW UP IN 1 MONTH  DASH Eating Plan DASH stands for "Dietary Approaches to Stop Hypertension." The DASH eating plan is a healthy eating plan that has been shown to reduce high blood pressure (hypertension). It may also reduce your risk for type 2 diabetes, heart disease, and stroke. The DASH eating plan may also help with weight loss. What are tips for following this plan?  General guidelines Avoid eating more than 2,300 mg (milligrams) of salt (sodium) a day. If you have hypertension, you may need to reduce your sodium intake to 1,500 mg a day. Limit alcohol intake to no more than 1 drink a day for nonpregnant women and 2 drinks a day for men. One drink equals 12 oz of beer, 5 oz of wine, or 1 oz of hard liquor. Work with your health care provider to maintain a healthy body weight or to lose weight. Ask what an ideal weight is for you. Get at least 30 minutes of exercise that causes your heart to beat faster (aerobic exercise) most days of the week. Activities may include walking, swimming, or biking. Work with your health care provider or diet and nutrition specialist (dietitian) to adjust your eating plan to your individual calorie needs. Reading food labels  Check food labels for the amount of sodium per serving. Choose foods with less than 5 percent of the Daily Value of sodium. Generally, foods with less than 300 mg of sodium per serving fit into this eating plan. To find whole grains, look for the word "whole" as the first word in the ingredient list. Shopping Buy products labeled as "low-sodium" or "no salt added." Buy fresh foods. Avoid canned foods  and premade or frozen meals. Cooking Avoid adding salt when cooking. Use salt-free seasonings or herbs instead of table salt or sea salt. Check with your health care provider or pharmacist before using salt substitutes. Do not fry foods. Cook foods using healthy methods such as baking, boiling, grilling, and broiling instead. Cook with heart-healthy oils, such as olive, canola, soybean, or sunflower oil. Meal planning Eat a balanced diet that includes: 5 or more servings of fruits and vegetables each day. At each meal, try to fill half of your plate with fruits and vegetables. Up to 6-8 servings of whole grains each day. Less than 6 oz of lean meat, poultry, or fish each day. A 3-oz serving of meat is about the same size as a deck of cards. One egg equals 1 oz. 2 servings of low-fat dairy each day. A serving of nuts, seeds, or beans 5 times each week. Heart-healthy fats. Healthy fats called Omega-3 fatty acids are found in foods such as flaxseeds and coldwater fish, like sardines, salmon, and mackerel. Limit how much you eat of the following: Canned or prepackaged foods. Food that is high in trans fat, such as fried foods. Food that is high in saturated fat, such as fatty meat. Sweets, desserts, sugary drinks, and other foods with added sugar. Full-fat dairy products. Do not salt foods before eating. Try to eat at least 2 vegetarian meals each week. Eat more home-cooked food and less restaurant, buffet, and fast food. When eating at  a restaurant, ask that your food be prepared with less salt or no salt, if possible. What foods are recommended? The items listed may not be a complete list. Talk with your dietitian about what dietary choices are best for you. Grains Whole-grain or whole-wheat bread. Whole-grain or whole-wheat pasta. Brown rice. Orpah Cobb. Bulgur. Whole-grain and low-sodium cereals. Pita bread. Low-fat, low-sodium crackers. Whole-wheat flour tortillas. Vegetables Fresh  or frozen vegetables (raw, steamed, roasted, or grilled). Low-sodium or reduced-sodium tomato and vegetable juice. Low-sodium or reduced-sodium tomato sauce and tomato paste. Low-sodium or reduced-sodium canned vegetables. Fruits All fresh, dried, or frozen fruit. Canned fruit in natural juice (without added sugar). Meat and other protein foods Skinless chicken or [place]. Ground chicken or [place]. Pork with fat trimmed off. Fish and seafood. Egg whites. Dried beans, peas, or lentils. Unsalted nuts, nut butters, and seeds. Unsalted canned beans. Lean cuts of beef with fat trimmed off. Low-sodium, lean deli meat. Dairy Low-fat (1%) or fat-free (skim) milk. Fat-free, low-fat, or reduced-fat cheeses. Nonfat, low-sodium ricotta or cottage cheese. Low-fat or nonfat yogurt. Low-fat, low-sodium cheese. Fats and oils Soft margarine without trans fats. Vegetable oil. Low-fat, reduced-fat, or light mayonnaise and salad dressings (reduced-sodium). Canola, safflower, olive, soybean, and sunflower oils. Avocado. Seasoning and other foods Herbs. Spices. Seasoning mixes without salt. Unsalted popcorn and pretzels. Fat-free sweets. What foods are not recommended? The items listed may not be a complete list. Talk with your dietitian about what dietary choices are best for you. Grains Baked goods made with fat, such as croissants, muffins, or some breads. Dry pasta or rice meal packs. Vegetables Creamed or fried vegetables. Vegetables in a cheese sauce. Regular canned vegetables (not low-sodium or reduced-sodium). Regular canned tomato sauce and paste (not low-sodium or reduced-sodium). Regular tomato and vegetable juice (not low-sodium or reduced-sodium). Rosita Fire. Olives. Fruits Canned fruit in a light or heavy syrup. Fried fruit. Fruit in cream or butter sauce. Meat and other protein foods Fatty cuts of meat. Ribs. Fried meat. Tomasa Blase. Sausage. Bologna and other processed lunch meats. Salami. Fatback. Hotdogs.  Bratwurst. Salted nuts and seeds. Canned beans with added salt. Canned or smoked fish. Whole eggs or egg yolks. Chicken or [place] with skin. Dairy Whole or 2% milk, cream, and half-and-half. Whole or full-fat cream cheese. Whole-fat or sweetened yogurt. Full-fat cheese. Nondairy creamers. Whipped toppings. Processed cheese and cheese spreads. Fats and oils Butter. Stick margarine. Lard. Shortening. Ghee. Bacon fat. Tropical oils, such as coconut, palm kernel, or palm oil. Seasoning and other foods Salted popcorn and pretzels. Onion salt, garlic salt, seasoned salt, table salt, and sea salt. Worcestershire sauce. Tartar sauce. Barbecue sauce. Teriyaki sauce. Soy sauce, including reduced-sodium. Steak sauce. Canned and packaged gravies. Fish sauce. Oyster sauce. Cocktail sauce. Horseradish that you find on the shelf. Ketchup. Mustard. Meat flavorings and tenderizers. Bouillon cubes. Hot sauce and Tabasco sauce. Premade or packaged marinades. Premade or packaged taco seasonings. Relishes. Regular salad dressings. Where to find more information: National Heart, Lung, and Blood Institute: PopSteam.is American Heart Association: www.heart.org Summary The DASH eating plan is a healthy eating plan that has been shown to reduce high blood pressure (hypertension). It may also reduce your risk for type 2 diabetes, heart disease, and stroke. With the DASH eating plan, you should limit salt (sodium) intake to 2,300 mg a day. If you have hypertension, you may need to reduce your sodium intake to 1,500 mg a day. When on the DASH eating plan, aim to eat more fresh fruits and vegetables,  whole grains, lean proteins, low-fat dairy, and heart-healthy fats. Work with your health care provider or diet and nutrition specialist (dietitian) to adjust your eating plan to your individual calorie needs. This information is not intended to replace advice given to you by your health care provider. Make sure you discuss any  questions you have with your health care provider. Document Released: 06/22/2011 Document Revised: 06/15/2017 Document Reviewed: 06/26/2016 Elsevier Patient Education  2020 [employer].

## 2021-07-12 NOTE — Assessment & Plan Note (Signed)
BNP elevated in the hospital.  He presented with heart failure symptoms and has improved significantly with Entresto, Lasix and carvedilol.  He is already set up to get an echo next week.  We will check a CMP given that he has started on these new medications.  Add Jardiance 10 mg daily.

## 2021-07-13 LAB — HEMOGLOBIN A1C
Est. average glucose Bld gHb Est-mCnc: 177 mg/dL
Hgb A1c MFr Bld: 7.8 % — ABNORMAL HIGH (ref 4.8–5.6)

## 2021-07-13 LAB — COMPREHENSIVE METABOLIC PANEL
ALT: 25 IU/L (ref 0–44)
AST: 20 IU/L (ref 0–40)
Albumin/Globulin Ratio: 1.3 (ref 1.2–2.2)
Albumin: 3.8 g/dL — ABNORMAL LOW (ref 4.0–5.0)
Alkaline Phosphatase: 92 IU/L (ref 44–121)
BUN/Creatinine Ratio: 14 (ref 9–20)
BUN: 15 mg/dL (ref 6–24)
Bilirubin Total: 0.2 mg/dL (ref 0.0–1.2)
CO2: 23 mmol/L (ref 20–29)
Calcium: 9.4 mg/dL (ref 8.7–10.2)
Chloride: 99 mmol/L (ref 96–106)
Creatinine, Ser: 1.1 mg/dL (ref 0.76–1.27)
Globulin, Total: 3 g/dL (ref 1.5–4.5)
Glucose: 197 mg/dL — ABNORMAL HIGH (ref 70–99)
Potassium: 4.4 mmol/L (ref 3.5–5.2)
Sodium: 140 mmol/L (ref 134–144)
Total Protein: 6.8 g/dL (ref 6.0–8.5)
eGFR: 82 mL/min/{1.73_m2} (ref >59)

## 2021-07-13 LAB — LIPID PANEL
Chol/HDL Ratio: 5.9 ratio — ABNORMAL HIGH (ref 0.0–5.0)
Cholesterol, Total: 301 mg/dL — ABNORMAL HIGH (ref 100–199)
HDL: 51 mg/dL (ref >39)
LDL Chol Calc (NIH): 179 mg/dL — ABNORMAL HIGH (ref 0–99)
Triglycerides: 365 mg/dL — ABNORMAL HIGH (ref 0–149)
VLDL Cholesterol Cal: 71 mg/dL — ABNORMAL HIGH (ref 5–40)

## 2021-07-19 ENCOUNTER — Telehealth: Payer: Self-pay | Admitting: Licensed Clinical Social Worker

## 2021-07-19 NOTE — Telephone Encounter (Signed)
As discussed during pt last appt w/ Heart[facility] I have sent him a message to f/u on resources and ensure that he has a ride to appt tomorrow 1/4. If needed we can arrange a ride.   Pt responded and states that he is all set and thanks this Clinical research associate. I remain available as needed.   Octavio Graves, MSW, LCSW Clinical Social Worker II [facility]  [phone]- work cell phone (preferred) [phone]- desk phone

## 2021-07-20 ENCOUNTER — Ambulatory Visit
Admission: RE | Admit: 2021-07-20 | Discharge: 2021-07-20 | Disposition: A | Discharge status: Home / Self Care | Payer: 59 | Source: Ambulatory Visit | Attending: Family | Admitting: Family

## 2021-07-20 ENCOUNTER — Other Ambulatory Visit: Payer: Self-pay

## 2021-07-20 ENCOUNTER — Other Ambulatory Visit
Admission: RE | Admit: 2021-07-20 | Discharge: 2021-07-20 | Disposition: A | Discharge status: Home / Self Care | Payer: 59 | Source: Ambulatory Visit | Attending: Family | Admitting: Family

## 2021-07-20 DIAGNOSIS — G4730 Sleep apnea, unspecified: Secondary | ICD-10-CM | POA: Insufficient documentation

## 2021-07-20 DIAGNOSIS — Z720 Tobacco use: Secondary | ICD-10-CM | POA: Insufficient documentation

## 2021-07-20 DIAGNOSIS — Z20822 Contact with and (suspected) exposure to covid-19: Secondary | ICD-10-CM | POA: Insufficient documentation

## 2021-07-20 DIAGNOSIS — I110 Hypertensive heart disease with heart failure: Secondary | ICD-10-CM | POA: Insufficient documentation

## 2021-07-20 DIAGNOSIS — I5032 Chronic diastolic (congestive) heart failure: Secondary | ICD-10-CM | POA: Insufficient documentation

## 2021-07-20 LAB — ECHOCARDIOGRAM COMPLETE
AR max vel: 1.83 cm2
AV Area VTI: 2.17 cm2
AV Area mean vel: 1.86 cm2
AV Mean grad: 3 mmHg
AV Peak grad: 4.2 mmHg
Ao pk vel: 1.03 m/s
Area-P 1/2: 4.54 cm2
Calc EF: 46.7 %
MV VTI: 2.33 cm2
S' Lateral: 3.5 cm
Single Plane A2C EF: 50.9 %
Single Plane A4C EF: 43.1 %

## 2021-07-20 NOTE — Progress Notes (Signed)
*  PRELIMINARY RESULTS* Echocardiogram 2D Echocardiogram has been performed.  Cristela Blue 07/20/2021, 11:28 AM

## 2021-07-21 ENCOUNTER — Ambulatory Visit: Payer: 59 | Attending: Otolaryngology

## 2021-07-21 ENCOUNTER — Telehealth: Payer: Self-pay | Admitting: Family

## 2021-07-21 ENCOUNTER — Telehealth: Payer: Self-pay

## 2021-07-21 DIAGNOSIS — G4700 Insomnia, unspecified: Secondary | ICD-10-CM | POA: Diagnosis not present

## 2021-07-21 DIAGNOSIS — I509 Heart failure, unspecified: Secondary | ICD-10-CM | POA: Diagnosis not present

## 2021-07-21 DIAGNOSIS — G4733 Obstructive sleep apnea (adult) (pediatric): Secondary | ICD-10-CM | POA: Diagnosis not present

## 2021-07-21 DIAGNOSIS — I110 Hypertensive heart disease with heart failure: Secondary | ICD-10-CM | POA: Insufficient documentation

## 2021-07-21 DIAGNOSIS — G4730 Sleep apnea, unspecified: Secondary | ICD-10-CM | POA: Diagnosis present

## 2021-07-21 DIAGNOSIS — G47419 Narcolepsy without cataplexy: Secondary | ICD-10-CM | POA: Insufficient documentation

## 2021-07-21 DIAGNOSIS — G4710 Hypersomnia, unspecified: Secondary | ICD-10-CM | POA: Insufficient documentation

## 2021-07-21 DIAGNOSIS — G478 Other sleep disorders: Secondary | ICD-10-CM | POA: Diagnosis not present

## 2021-07-21 LAB — SARS CORONAVIRUS 2 (TAT 6-24 HRS): SARS Coronavirus 2: NEGATIVE

## 2021-07-21 MED ORDER — BENZONATATE 100 MG PO CAPS: 100.0000 mg | ORAL_CAPSULE | Freq: Three times a day (TID) | ORAL | 0 refills | Status: DC | PRN

## 2021-07-21 NOTE — Telephone Encounter (Signed)
Reviewed echo results with patient. He says that he's no longer taking the jardiance because his pharmacy said that it would cost him $1000 and he can't afford that. No problem tolerating it but has run out of samples and voucher. Advised patient to come in the office tomorrow and we can give him samples and also fill out patient assistance for jardiance.   He also asks for something for his cough. Says that he continues to cough "so bad that I can't sleep". Will send in [facility] for him to take.   Having sleep study tonight.

## 2021-07-21 NOTE — Telephone Encounter (Signed)
-----   Message from Delma Freeze, [place] sent at 07/21/2021  8:31 AM EST ----- Echo results show slight decrease in heart function and mild heart enlargement most likely due to BP. Already on entresto, carvedilol and jardiance that can help improve the heart function. Continue these medications. At next visit, will discuss increasing the dosages of these medications.

## 2021-07-25 ENCOUNTER — Other Ambulatory Visit: Payer: Self-pay

## 2021-08-01 ENCOUNTER — Encounter: Payer: Self-pay | Admitting: Family

## 2021-08-02 ENCOUNTER — Telehealth: Payer: Self-pay | Admitting: Family

## 2021-08-02 NOTE — Telephone Encounter (Signed)
Patient has been approved for Jardiance Patient assistance until 08/01/2022    Luetta Nutting, NT

## 2021-08-03 ENCOUNTER — Telehealth (HOSPITAL_BASED_OUTPATIENT_CLINIC_OR_DEPARTMENT_OTHER): Payer: Self-pay | Admitting: *Deleted

## 2021-08-03 DIAGNOSIS — E7800 Pure hypercholesterolemia, unspecified: Secondary | ICD-10-CM

## 2021-08-03 DIAGNOSIS — I10 Essential (primary) hypertension: Secondary | ICD-10-CM

## 2021-08-03 DIAGNOSIS — Z5181 Encounter for therapeutic drug level monitoring: Secondary | ICD-10-CM

## 2021-08-03 MED ORDER — METFORMIN HCL 500 MG PO TABS: 500.0000 mg | ORAL_TABLET | Freq: Two times a day (BID) | ORAL | 3 refills | Status: DC

## 2021-08-03 MED ORDER — ROSUVASTATIN CALCIUM 20 MG PO TABS: 20.0000 mg | ORAL_TABLET | Freq: Every day | ORAL | 3 refills | Status: DC

## 2021-08-03 NOTE — Telephone Encounter (Signed)
Advised patient of lab results  Rx for Metformin and Crestor sent to pharmacy Lab orders placed at front desk for when he comes by to pick up Jardiance samples I Lot [id number] exp 5/24

## 2021-08-03 NOTE — Telephone Encounter (Signed)
-----   Message from Skeet Latch, MD sent at 08/01/2021  8:49 AM EST ----- Cholesterol levels are very elevated. Limit fried foods, fatty foods, red meat and cheese.  Recommend starting rosuvastatin 20mg  daily.  Lipids and CMP in 2-3 months.  A1c is elevated.  He has diabetes.  Limit carbohydrates.  Referral to nutritionist.  Start metformin 500mg  bid and he needs to see a PCP to keep managing it.

## 2021-08-08 ENCOUNTER — Ambulatory Visit: Payer: 59 | Admitting: Family

## 2021-08-09 NOTE — Progress Notes (Deleted)
Office Visit    Patient Name: Kirk Lyons Date of Encounter: 08/10/2021  Primary Care Provider:  Patient, No Pcp Per (Inactive) Primary Cardiologist:  Skeet Latch, MD  Chief Complaint    51 year old male with a history of hypertension, chronic systolic heart failure (HFmrEF), incomplete RBBB, gout, OSA, obesity, and tobacco use who presents for follow-up related to heart failure and hypertension.    Past Medical History    Past Medical History:  Diagnosis Date   CHF (congestive heart failure) ([place])    Essential hypertension 07/12/2021   Gout    Heart failure, type unknown ([place]) 07/12/2021   Hypertension    Obesity (BMI 30.0-34.9) 07/12/2021   OSA (obstructive sleep apnea) 07/12/2021   Tobacco use 07/12/2021   Past Surgical History:  Procedure Laterality Date   HERNIA REPAIR      Allergies  Allergies  Allergen Reactions   Erythromycin Nausea And Vomiting    vomiting vomiting    History of Present Illness    51 year old male with the above past medical history including hypertension, 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  He was evaluated in the ED in November 2022 for worsening dyspnea in the setting of acute on chronic combined systolic and diastolic heart failure.  Symptoms improved with IV diuresis.  He was started on Entresto, Lasix, carvedilol, and Jardiance.  Follow-up echocardiogram in January 2023 showed EF 45 to 30%, mildly decreased LV function, LV global hypokinesis, mild LVH, grade 2 diastolic dysfunction, mild BAE. He has been followed by Darylene Price, NP in the heart failure clinic. He was last seen by Dr. Oval Linsey in the hypertension clinic on 07/12/2021 and was stable overall from a cardiac standpoint, blood pressure was much improved on Entresto. He did note transportation difficulties, CSW was contacted for assistance.   He presents today for follow-up. Since his  last visit  Chronic combined systolic and diastolic heart failure: Hypertension: OSA: Obesity: Tobacco abuse: Disposition:  Home Medications    Current Outpatient Medications  Medication Sig Dispense Refill   albuterol (VENTOLIN HFA) 108 (90 Base) MCG/ACT inhaler Inhale 2 puffs into the lungs every 6 (six) hours as needed for wheezing or shortness of breath. 8 g 0   allopurinol (ZYLOPRIM) 100 MG tablet Take 1 tablet (100 mg total) by mouth daily. (Patient not taking: Reported on 07/12/2021) 30 tablet 5   benzonatate (TESSALON PERLES) 100 MG capsule Take 1 capsule (100 mg total) by mouth 3 (three) times daily as needed for cough. 20 capsule 0   carvedilol (COREG) 3.125 MG tablet Take 1 tablet (3.125 mg total) by mouth 2 (two) times daily. 60 tablet 3   colchicine 0.6 MG tablet Take 2 tablets (1.2mg ) by mouth at first sign of gout flare followed by 1 tablet (0.6mg ) after 1 hour. (Max 1.8mg  within 1 hour)     empagliflozin (JARDIANCE) 10 MG TABS tablet Take 1 tablet (10 mg total) by mouth daily before breakfast. 90 tablet 1   furosemide (LASIX) 20 MG tablet Take 1 tablet (20 mg total) by mouth daily. 30 tablet 5   metFORMIN (GLUCOPHAGE) 500 MG tablet Take 1 tablet (500 mg total) by mouth 2 (two) times daily with a meal. 60 tablet 3   Multiple Vitamins-Minerals (EMERGEN-C VITAMIN C) PACK Take 1 Package by mouth as needed (immune system support).     rosuvastatin (CRESTOR) 20 MG tablet Take 1 tablet (20 mg total) by mouth daily.  90 tablet 3   sacubitril-valsartan (ENTRESTO) 24-26 MG Take 1 tablet by mouth 2 (two) times daily. 60 tablet 3   No current facility-administered medications for this visit.     Review of Systems    ***.  All other systems reviewed and are otherwise negative except as noted above.    Physical Exam    VS:  There were no vitals taken for this visit. , BMI There is no height or weight on file to calculate BMI.     GEN: Well nourished, well developed, in no acute  distress. HEENT: normal. Neck: Supple, no JVD, carotid bruits, or masses. Cardiac: RRR, no murmurs, rubs, or gallops. No clubbing, cyanosis, edema.  Radials/DP/PT 2+ and equal bilaterally.  Respiratory:  Respirations regular and unlabored, clear to auscultation bilaterally. GI: Soft, nontender, nondistended, BS + x 4. MS: no deformity or atrophy. Skin: warm and dry, no rash. Neuro:  Strength and sensation are intact. Psych: Normal affect.  Accessory Clinical Findings    ECG personally reviewed by me today - *** - no acute changes.  Lab Results  Component Value Date   WBC 10.1 06/09/2021   HGB 14.4 06/09/2021   HCT 43.1 06/09/2021   MCV 95.8 06/09/2021   PLT 324 06/09/2021   Lab Results  Component Value Date   CREATININE 1.10 07/12/2021   BUN 15 07/12/2021   NA 140 07/12/2021   K 4.4 07/12/2021   CL 99 07/12/2021   CO2 23 07/12/2021   Lab Results  Component Value Date   ALT 25 07/12/2021   AST 20 07/12/2021   ALKPHOS 92 07/12/2021   BILITOT 0.2 07/12/2021   Lab Results  Component Value Date   CHOL 301 (H) 07/12/2021   HDL 51 07/12/2021   LDLCALC 179 (H) 07/12/2021   TRIG 365 (H) 07/12/2021   CHOLHDL 5.9 (H) 07/12/2021    Lab Results  Component Value Date   HGBA1C 7.8 (H) 07/12/2021    Assessment & Plan    1.  ***   Lenna Sciara, NP 08/10/2021, 7:23 AM

## 2021-08-10 ENCOUNTER — Ambulatory Visit (HOSPITAL_BASED_OUTPATIENT_CLINIC_OR_DEPARTMENT_OTHER): Payer: 59 | Admitting: General Practice

## 2021-08-10 ENCOUNTER — Other Ambulatory Visit: Payer: Self-pay | Admitting: Family

## 2021-08-16 ENCOUNTER — Ambulatory Visit (HOSPITAL_BASED_OUTPATIENT_CLINIC_OR_DEPARTMENT_OTHER): Payer: 59 | Admitting: Cardiovascular Disease

## 2021-08-19 IMAGING — CR DG CHEST 2V
2 series · 2 of 2 positions shown · non-contrast
Comparison: None.

CLINICAL DATA: Shortness of breath.  Cough.

EXAM:
CHEST - 2 VIEW

[chest pa]
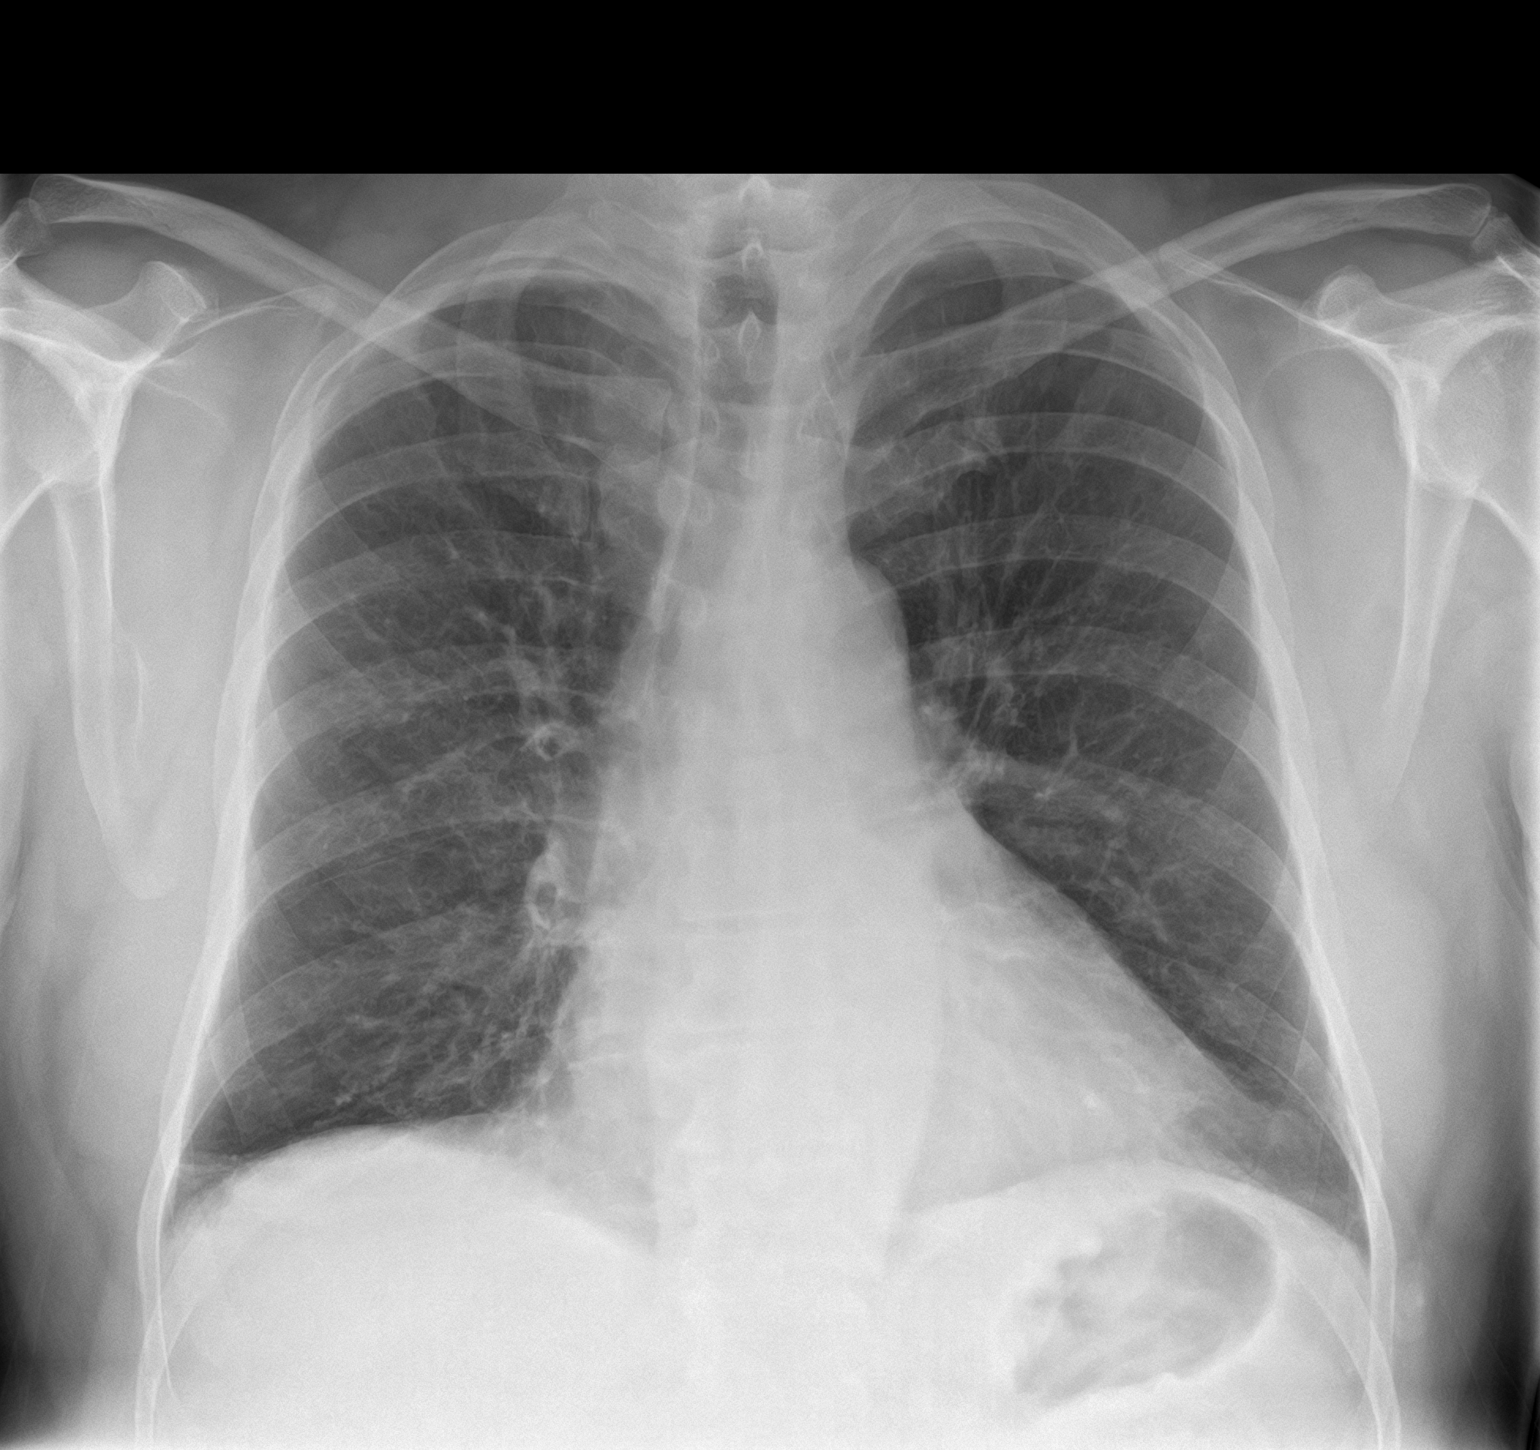

[chest lat]
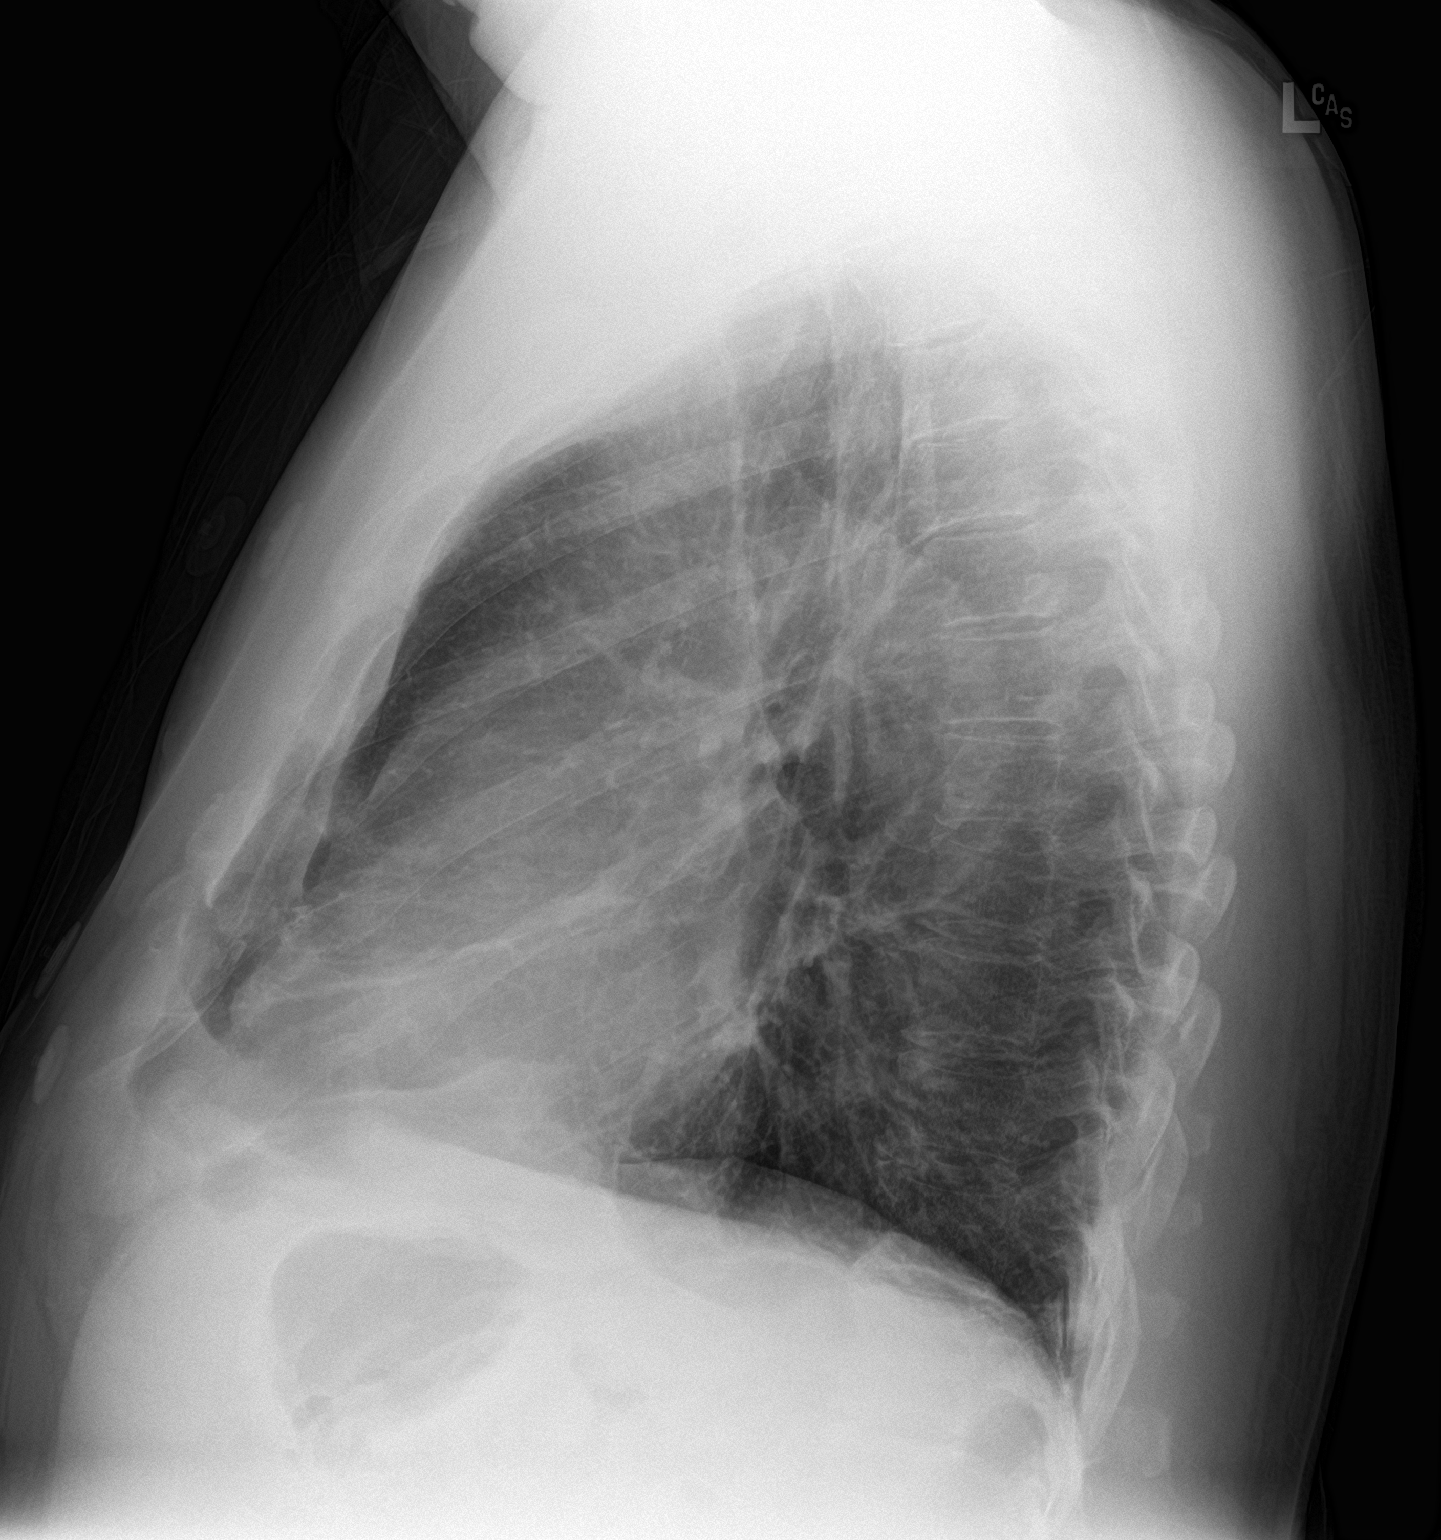

[2 of 2 positions shown; findings below may reference images not displayed]

FINDINGS: The heart size and mediastinal contours are within normal limits.
Both lungs are clear. The visualized skeletal structures are
unremarkable.
IMPRESSION: No active cardiopulmonary disease.

## 2021-08-22 ENCOUNTER — Encounter: Payer: Self-pay | Admitting: Family

## 2021-08-22 ENCOUNTER — Other Ambulatory Visit
Admission: RE | Admit: 2021-08-22 | Discharge: 2021-08-22 | Disposition: A | Discharge status: Home / Self Care | Payer: 59 | Source: Ambulatory Visit | Attending: Family | Admitting: Family

## 2021-08-22 ENCOUNTER — Other Ambulatory Visit: Payer: Self-pay

## 2021-08-22 ENCOUNTER — Ambulatory Visit (HOSPITAL_BASED_OUTPATIENT_CLINIC_OR_DEPARTMENT_OTHER): Payer: 59 | Admitting: Family

## 2021-08-22 VITALS — BP 147/101 | HR 107 | Resp 20 | Ht 74.0 in | Wt 253.0 lb

## 2021-08-22 DIAGNOSIS — I5032 Chronic diastolic (congestive) heart failure: Secondary | ICD-10-CM

## 2021-08-22 DIAGNOSIS — F17210 Nicotine dependence, cigarettes, uncomplicated: Secondary | ICD-10-CM | POA: Insufficient documentation

## 2021-08-22 DIAGNOSIS — I110 Hypertensive heart disease with heart failure: Secondary | ICD-10-CM | POA: Insufficient documentation

## 2021-08-22 DIAGNOSIS — M109 Gout, unspecified: Secondary | ICD-10-CM | POA: Insufficient documentation

## 2021-08-22 DIAGNOSIS — Z91120 Patient's intentional underdosing of medication regimen due to financial hardship: Secondary | ICD-10-CM | POA: Insufficient documentation

## 2021-08-22 DIAGNOSIS — T447X6A Underdosing of beta-adrenoreceptor antagonists, initial encounter: Secondary | ICD-10-CM | POA: Insufficient documentation

## 2021-08-22 DIAGNOSIS — Z8249 Family history of ischemic heart disease and other diseases of the circulatory system: Secondary | ICD-10-CM | POA: Insufficient documentation

## 2021-08-22 DIAGNOSIS — E119 Type 2 diabetes mellitus without complications: Secondary | ICD-10-CM

## 2021-08-22 DIAGNOSIS — I10 Essential (primary) hypertension: Secondary | ICD-10-CM | POA: Diagnosis not present

## 2021-08-22 DIAGNOSIS — Z79899 Other long term (current) drug therapy: Secondary | ICD-10-CM | POA: Insufficient documentation

## 2021-08-22 DIAGNOSIS — I5022 Chronic systolic (congestive) heart failure: Secondary | ICD-10-CM | POA: Insufficient documentation

## 2021-08-22 DIAGNOSIS — G4733 Obstructive sleep apnea (adult) (pediatric): Secondary | ICD-10-CM

## 2021-08-22 DIAGNOSIS — E7800 Pure hypercholesterolemia, unspecified: Secondary | ICD-10-CM

## 2021-08-22 DIAGNOSIS — B379 Candidiasis, unspecified: Secondary | ICD-10-CM | POA: Insufficient documentation

## 2021-08-22 DIAGNOSIS — Z7984 Long term (current) use of oral hypoglycemic drugs: Secondary | ICD-10-CM | POA: Insufficient documentation

## 2021-08-22 DIAGNOSIS — F419 Anxiety disorder, unspecified: Secondary | ICD-10-CM | POA: Insufficient documentation

## 2021-08-22 LAB — BASIC METABOLIC PANEL
Anion gap: 8 (ref 5–15)
BUN: 12 mg/dL (ref 6–20)
CO2: 27 mmol/L (ref 22–32)
Calcium: 9.2 mg/dL (ref 8.9–10.3)
Chloride: 102 mmol/L (ref 98–111)
Creatinine, Ser: 1.22 mg/dL (ref 0.61–1.24)
GFR, Estimated: >60 mL/min (ref >60)
Glucose, Bld: 147 mg/dL — ABNORMAL HIGH (ref 70–99)
Potassium: 3.9 mmol/L (ref 3.5–5.1)
Sodium: 137 mmol/L (ref 135–145)

## 2021-08-22 NOTE — Progress Notes (Signed)
Patient ID: Kirk Lyons, male    DOB: 11-Dec-1970, 51 y.o.   MRN: [id number]   [name] is a 51 y/o male with a history of HTN, gout, tobacco use and chronic heart failure.   Echo 07/20/21: Left Ventricle: Left ventricular ejection fraction, by estimation, is 45 to 50%. The left ventricle has mildly decreased function. The left ventricle demonstrates global hypokinesis. The left ventricular internal cavity size was normal in size. There is  mild left ventricular hypertrophy. Left ventricular diastolic parameters are consistent with Grade II diastolic dysfunction (pseudonormalization).  Right Ventricle: The right ventricular size is normal. Moderately  increased right ventricular wall thickness. Right ventricular systolic function is normal. Tricuspid regurgitation signal is inadequate for assessing PA pressure.  Left Atrium: Left atrial size was mildly dilated.  Right Atrium: Right atrial size was mildly dilated   Was in the ED 06/09/21 due to shortness of breath and cough after being out of medications for ~ 1 week. Elevated troponin thought to be due to demand ischemia. Given IV lasix and admission recommended but patient declined. Symptoms improved and he was released.   He presents today for a follow-up visit with a chief complaint of minimal fatigue upon moderate exertion. He describes this as chronic in nature having been present for "quite awhile". He has associated dry cough, palpitations, dizziness, anxiety and difficulty sleeping along with this. He denies any shortness of breath, chest pain, pedal edema, abdominal distention or weight gain.   Saw cardiology on 07/12/22, he states they called him to say "you have diabetes" but he personally did not believe this was true. Reviewed his A1C with him, explained that he does indeed have diabetes and needs to pick up the metformin that was sent in for him.  Past Medical History:  Diagnosis Date   CHF (congestive heart failure) ([place])     Essential hypertension 07/12/2021   Gout    Heart failure, type unknown ([place]) 07/12/2021   Hypertension    Obesity (BMI 30.0-34.9) 07/12/2021   OSA (obstructive sleep apnea) 07/12/2021   Tobacco use 07/12/2021   Past Surgical History:  Procedure Laterality Date   HERNIA REPAIR     Family History  Problem Relation Age of Onset   Breast cancer Mother    Hyperlipidemia Father    Coronary artery disease Father    Prostate cancer Father    Testicular cancer Brother    Social History   Tobacco Use   Smoking status: Some Days    Types: Cigarettes   Smokeless tobacco: Never   Tobacco comments:    May smoke a cigarette if he has a beer  Substance Use Topics   Alcohol use: Yes    Alcohol/week: 3.0 standard drinks    Types: 3 Cans of beer per week    Comment: social   Allergies  Allergen Reactions   Erythromycin Nausea And Vomiting    vomiting vomiting   Prior to Admission medications   Medication Sig Start Date End Date Taking? Authorizing Provider  albuterol (VENTOLIN HFA) 108 (90 Base) MCG/ACT inhaler Inhale 2 puffs into the lungs every 6 (six) hours as needed for wheezing or shortness of breath. 06/06/20  Yes Blake Divine, MD  furosemide (LASIX) 20 MG tablet Take 1 tablet (20 mg total) by mouth daily. 06/23/21 07/23/21 Yes Hackney, Tina A, FNP  sacubitril-valsartan (ENTRESTO) 24-26 MG Take 1 tablet by mouth 2 (two) times daily. 06/23/21  Yes Darylene Price A, FNP  allopurinol (ZYLOPRIM) 100 MG  tablet Take 1 tablet (100 mg total) by mouth daily. Patient not taking: Reported on 06/17/2021 08/04/20 08/04/21  Faythe Ghee, PA-C  Multiple Vitamins-Minerals (EMERGEN-C VITAMIN C) PACK Take 1 Package by mouth as needed (immune system support).    [provider]   Review of Systems  Constitutional:  Positive for appetite change (decreased) and fatigue.  HENT:  Negative for congestion, postnasal drip and sore throat.   Eyes: Negative.   Respiratory:  Positive for cough.  Negative for chest tightness and shortness of breath.   Cardiovascular:  Positive for palpitations. Negative for chest pain and leg swelling.  Gastrointestinal:  Negative for abdominal distention and abdominal pain.  Endocrine: Negative.   Genitourinary: Negative.   Musculoskeletal:  Negative for back pain and neck pain.  Skin: Negative.   Allergic/Immunologic: Negative.   Neurological:  Positive for dizziness (at times). Negative for light-headedness.  Hematological:  Negative for adenopathy. Does not bruise/bleed easily.  Psychiatric/Behavioral:  Positive for decreased concentration and sleep disturbance. Negative for dysphoric mood. The patient is nervous/anxious.    Vitals:   08/22/21 1224  BP: (!) 147/101  Pulse: (!) 107  Resp: 20  SpO2: 96%  Weight: 253 lb (114.8 kg)  Height: 6\' 2"  (1.88 m)   Wt Readings from Last 3 Encounters:  08/22/21 253 lb (114.8 kg)  07/12/21 258 lb (117 kg)  07/06/21 254 lb (115.2 kg)   Lab Results  Component Value Date   CREATININE 1.10 07/12/2021   CREATININE 1.05 07/06/2021   CREATININE 1.04 06/23/2021   Physical Exam Vitals and nursing note reviewed.  Constitutional:      General: He is not in acute distress.    Appearance: He is well-developed.  HENT:     Head: Normocephalic and atraumatic.  Neck:     Vascular: No JVD.  Cardiovascular:     Rate and Rhythm: Regular rhythm. Tachycardia present.  Pulmonary:     Effort: Pulmonary effort is normal.     Breath sounds: No wheezing, rhonchi or rales.  Abdominal:     Palpations: Abdomen is soft.     Tenderness: There is no abdominal tenderness.  Musculoskeletal:     Cervical back: Normal range of motion and neck supple.     Right lower leg: No edema.     Left lower leg: No edema.  Skin:    General: Skin is warm and dry.  Neurological:     General: No focal deficit present.     Mental Status: He is alert and oriented to person, place, and time.  Psychiatric:        Mood and Affect:  Mood is anxious.        Behavior: Behavior normal.   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- - NYHA class II  - euvolemic today - not weighing daily; reminded to weigh daily so that he can call for an overnight weight gain of > 2 pounds or a weekly weight gain of >5 pounds - weight 253 today, down 1 lb from 2 months ago - not adding salt and tries to review food labels - saw cardiology Duke Salvia) on 07/12/22 - has been out of his carvedilol for two weeks, cites financial constraints, but will pick up today once he gets his paycheck - cannot afford entresto, will assist again with patient financial assistance; copies of paychecks sent to [employer] - samples of entresto provided - out of lasix x 4 days, will pick up  today once he gets paid - on GDMT of jardiance, entresto and coreg - will check BMP today  - consider adding spironolactone if he can commit to getting labs drawn - BNP 06/20/21 was 494.9  2: HTN- - BP 147/101, rechecked 152/110 - has been out of carvedilol x 2 weeks and out of furosemide for 4 days - has PCP appt scheduled for March 2023 - BMP 07/12/21 reviewed and showed sodium 140, potassium 4.4, creatinine 1.10 and GFR 82   3: Obstructive sleep apnea-  - says that he hasn't had a complete night sleep in ~ 20 years - roommate reports sleepwalking - completed sleep study - will send parameters orders for auto-CPAP per initial sleep study results - also sent order for sleep lab for him to return for CPAP titration  4: Diabetes mellitus- - A1C on 07/12/21 7.8% - he was skeptical that he has diabetes, advised that he does indeed have diabetes  - recent yeast infection in axilla, he felt it was d/t jardiance, advised it was likely from elevated blood sugar - metformin 500 mg BID called into pharmacy by Dr. Oval Linsey, but patient did not pick up as he did not believe he had DM - he will pick up today and begin taking, discussed  possible GI side effects but they are usually mild and self-limiting  5: Hypercholesteremia- - lipid panel checked by Dr. Oval Linsey and crestor called in - patient advised he could not afford and felt like it was familial  - advised him that it could in fact be hereditary, but that did not preclude him from MI, stroke - he will pick up today and start taking   Medication bottles reviewed.   Return in 6 weeks or sooner for any questions/problems before then.

## 2021-08-22 NOTE — Patient Instructions (Addendum)
The [facility] will be moving around the corner to [address] mid-February. Our phone number will remain the same.  You need to make sure you pick up the following meds today: - carvedilol - lasix - metformin (for diabetes) ** new from Dr. Duke Salvia - rosuvastatin (for cholesterol)  ** new from Dr. Duke Salvia   Sleep study, your oxygen level was low during this. The sleep study place will contact you after Inetta Fermo sends the order in for titration.  Return to HF clinic in 6 weeks.

## 2021-08-25 ENCOUNTER — Encounter: Payer: Self-pay | Admitting: Family

## 2021-09-08 ENCOUNTER — Telehealth: Payer: Self-pay

## 2021-09-08 ENCOUNTER — Other Ambulatory Visit: Payer: Self-pay | Admitting: Family

## 2021-09-08 MED ORDER — FUROSEMIDE 20 MG PO TABS: 40.0000 mg | ORAL_TABLET | Freq: Every day | ORAL | 3 refills | Status: DC

## 2021-09-08 NOTE — Telephone Encounter (Signed)
Notified patient that a new prescription for furosemide has been sent to his pharmacy. It will now be 40 mg tablets instead of 20 mg tablets, so he will only need to take 1 tablet daily. Patient verbalized understanding of how to take furosemide when he picks up his new prescription. Suanne Marker, RN

## 2021-09-08 NOTE — Telephone Encounter (Signed)
Patient called to state he only has 1 pill left of his entresto and his patient assistance form for entresto is still pending approval.  Patient advised he can come today and pick up 28 day sample and Amber, NT, will reach out to [employer] and ask about patient assistance status.  Patient also asked about a new RX for furosemide that reflects the changed dose of two 20 mg tablets/day. Clarisa Kindred, FNP, notified. She stated she will send new RX for furosemide. Patient acknowledged and stated he will pick up entresto sample this afternoon. Suanne Marker, RN

## 2021-09-13 ENCOUNTER — Emergency Department
Admission: EM | Admit: 2021-09-13 | Discharge: 2021-09-13 | Disposition: A | Discharge status: Home / Self Care | Payer: 59 | Attending: Emergency Medicine | Admitting: Emergency Medicine

## 2021-09-13 ENCOUNTER — Other Ambulatory Visit: Payer: Self-pay

## 2021-09-13 DIAGNOSIS — M79672 Pain in left foot: Secondary | ICD-10-CM | POA: Diagnosis present

## 2021-09-13 DIAGNOSIS — F17210 Nicotine dependence, cigarettes, uncomplicated: Secondary | ICD-10-CM | POA: Diagnosis not present

## 2021-09-13 DIAGNOSIS — I509 Heart failure, unspecified: Secondary | ICD-10-CM | POA: Insufficient documentation

## 2021-09-13 DIAGNOSIS — R030 Elevated blood-pressure reading, without diagnosis of hypertension: Secondary | ICD-10-CM

## 2021-09-13 DIAGNOSIS — M109 Gout, unspecified: Secondary | ICD-10-CM | POA: Diagnosis not present

## 2021-09-13 DIAGNOSIS — I110 Hypertensive heart disease with heart failure: Secondary | ICD-10-CM | POA: Insufficient documentation

## 2021-09-13 MED ORDER — PREDNISONE 10 MG PO TABS: ORAL_TABLET | ORAL | 0 refills | Status: DC

## 2021-09-13 MED ORDER — OXYCODONE-ACETAMINOPHEN 5-325 MG PO TABS
1.0000 | ORAL_TABLET | Freq: Once | ORAL | Status: AC
  Administered 2021-09-13: 1 via ORAL
  Filled 2021-09-13: qty 1

## 2021-09-13 MED ORDER — OXYCODONE-ACETAMINOPHEN 5-325 MG PO TABS: 1.0000 | ORAL_TABLET | Freq: Four times a day (QID) | ORAL | 0 refills | Status: DC | PRN

## 2021-09-13 MED ORDER — KETOROLAC TROMETHAMINE 30 MG/ML IJ SOLN
30.0000 mg | Freq: Once | INTRAMUSCULAR | Status: AC
Start: 2021-09-13 — End: 2021-09-13
  Administered 2021-09-13: 30 mg via INTRAMUSCULAR
  Filled 2021-09-13: qty 1

## 2021-09-13 NOTE — ED Triage Notes (Signed)
Pt c/o left ankle and heel pain since last night, states he has ha hx of gout and this feels the same, denies injury

## 2021-09-13 NOTE — ED Notes (Signed)
See triage note  presents with pain to left foot  states pain is mainly at heel   feels like gout  hx of same

## 2021-09-13 NOTE — ED Provider Notes (Signed)
[facility] Provider Note    Event Date/Time   First MD Initiated Contact with Patient 09/13/21 0730     (approximate)   History   Foot Pain   HPI  Kirk Lyons is a 51 y.o. male   presents to the ED with complaint of left heel and ankle pain since last night.  Patient states that approximately 4 AM it woke him up with severe pain that feels exactly like his gout.  Patient has a history of gout, CHF, hypertension, sleep apnea and heart failure.  Patient continues to smoke cigarettes.  His last gout attack was almost a year ago as he has changed his diet.  Patient states that he ran out of colchicine.  Currently rates pain as an 8 out of 10.      Physical Exam   Triage Vital Signs: ED Triage Vitals  Enc Vitals Group     BP 09/13/21 0708 (!) 170/110     Pulse Rate 09/13/21 0708 100     Resp 09/13/21 0708 18     Temp 09/13/21 0708 98.1 F (36.7 C)     Temp Source 09/13/21 0708 Oral     SpO2 09/13/21 0708 94 %     Weight 09/13/21 0710 256 lb (116.1 kg)     Height 09/13/21 0710 6\' 2"  (1.88 m)     Head Circumference --      Peak Flow --      Pain Score 09/13/21 0709 8     Pain Loc --      Pain Edu? --      Excl. in [place]? --     Most recent vital signs: Vitals:   09/13/21 0708  BP: (!) 170/110  Pulse: 100  Resp: 18  Temp: 98.1 F (36.7 C)  SpO2: 94%     General: Awake, no distress.  CV:  Good peripheral perfusion.  Heart regular rate and rhythm. Resp:  Normal effort.  Lungs are clear bilaterally. Abd:  No distention.  Other:  Examination of the left foot there is no gross deformity however there is marked tenderness on palpation of the plantar aspect of the calcaneus and ankle area.  Minimal soft tissue edema and warmth.  No erythema at this time.  Skin is intact, pulses are present.  Motor and sensory function intact.    ED Results / Procedures / Treatments   Labs (all labs ordered are listed, but only abnormal results are  displayed) Labs Reviewed - No data to display    PROCEDURES:  Critical Care performed:   Procedures   MEDICATIONS ORDERED IN ED: Medications  ketorolac (TORADOL) 30 MG/ML injection 30 mg (30 mg Intramuscular Given 09/13/21 0745)  oxyCODONE-acetaminophen (PERCOCET/ROXICET) 5-325 MG per tablet 1 tablet (1 tablet Oral Given 09/13/21 0745)     IMPRESSION / MDM / ASSESSMENT AND PLAN / ED COURSE  I reviewed the triage vital signs and the nursing notes.   Differential diagnosis includes, but is not limited to, gout left foot and ankle, arthralgia, stress fracture, ankle sprain.  51 year old male presents to the ED with complaint of left foot and ankle pain that started suddenly at approximately 4 AM this morning.  Patient states that he has a history of gout and this feels exactly the same.  He denies any recent injury to his foot or ankle.  He was seen last year for gout and states that the medication helped a great deal.  He was told by  his PCP to discontinue taking the allopurinol.  Patient was given Toradol 30 mg IM and Percocet as he is not driving.  A prescription for prednisone taper and Percocet was sent to the pharmacy.  He is to follow-up with his PCP if any continued problems or not improving.  Also he is made aware that his blood pressure was elevated which may be related to pain is also encouraged to follow-up with his PCP to have his blood pressure rechecked.     FINAL CLINICAL IMPRESSION(S) / ED DIAGNOSES   Final diagnoses:  Acute gout of left foot, unspecified cause  Elevated blood pressure reading     Rx / DC Orders   ED Discharge Orders          Ordered    oxyCODONE-acetaminophen (PERCOCET) 5-325 MG tablet  Every 6 hours PRN        09/13/21 0806    predniSONE (DELTASONE) 10 MG tablet        09/13/21 [id number]             Note:  This document was prepared using Dragon voice recognition software and may include unintentional dictation errors.   Johnn Hai, PA-C 09/13/21 [id number]    Nena Polio, MD 09/13/21 [phone]

## 2021-09-13 NOTE — Discharge Instructions (Addendum)
Follow-up with your primary care provider if any continued problems.  Follow-up with your primary care provider to have your blood pressure rechecked as it was elevated in the emergency department at 170/110.  This may be a response to pain that you are having from your gout.  The prescriptions were sent to your pharmacy.  Begin taking as directed be aware that the oxycodone could cause drowsiness and increase your risk for falling.

## 2021-09-14 ENCOUNTER — Telehealth: Payer: Self-pay | Admitting: Family

## 2021-09-14 NOTE — Telephone Encounter (Signed)
Notified patient that I need copy of his w2 from work for proof of income for [employer] patient assistance for [employer]. ? ? ?Roshanda Balazs, NT ?

## 2021-09-19 ENCOUNTER — Other Ambulatory Visit: Payer: Self-pay

## 2021-09-19 ENCOUNTER — Ambulatory Visit (INDEPENDENT_AMBULATORY_CARE_PROVIDER_SITE_OTHER): Payer: 59 | Admitting: Nurse Practitioner

## 2021-09-19 ENCOUNTER — Encounter: Payer: Self-pay | Admitting: Nurse Practitioner

## 2021-09-19 VITALS — BP 125/79 | HR 107 | Temp 98.7°F | Ht 73.0 in | Wt 254.2 lb

## 2021-09-19 DIAGNOSIS — M109 Gout, unspecified: Secondary | ICD-10-CM | POA: Diagnosis not present

## 2021-09-19 DIAGNOSIS — M7732 Calcaneal spur, left foot: Secondary | ICD-10-CM

## 2021-09-19 DIAGNOSIS — I509 Heart failure, unspecified: Secondary | ICD-10-CM

## 2021-09-19 DIAGNOSIS — Z7689 Persons encountering health services in other specified circumstances: Secondary | ICD-10-CM | POA: Diagnosis not present

## 2021-09-19 DIAGNOSIS — I10 Essential (primary) hypertension: Secondary | ICD-10-CM

## 2021-09-19 MED ORDER — KETOROLAC TROMETHAMINE 60 MG/2ML IM SOLN
60.0000 mg | Freq: Once | INTRAMUSCULAR | Status: AC
  Administered 2021-09-19: 60 mg via INTRAMUSCULAR

## 2021-09-19 MED ORDER — COLCHICINE 0.6 MG PO TABS: 0.6000 mg | ORAL_TABLET | Freq: Every day | ORAL | 0 refills | Status: DC

## 2021-09-19 NOTE — Progress Notes (Signed)
? ?BP 125/79   Pulse (!) 107   Temp 98.7 ?F (37.1 ?C) (Oral)   Ht 6\' 1"  (1.854 m)   Wt 254 lb 3.2 oz (115.3 kg)   SpO2 98%   BMI 33.54 kg/m?   ? ?Subjective:  ? ? Patient ID: Kirk Lyons, male    DOB: October 09, 1970, 51 y.o.   MRN: [id number] ? ?HPI: ?[name] is a 51 y.o. male ? ?Chief Complaint  ?Patient presents with  ? New Patient (Initial Visit)  ? Gout  ?  Pt states he has been having issues with gout in both of his feet   ? ? ?Patient presents to clinic to establish care with new PCP.  Introduced to 44 role and practice setting.  All questions answered.  Discussed provider/patient relationship and expectations.  Patient reports a history of Heart Failure, hypertension, elevated cholesterol  diabetes type 2, OSA.  Has a history of Gout.  Social smoking.  ? ?Patient denies a history of: Hypertension, Elevated Cholesterol, Diabetes, Thyroid problems, Depression, Anxiety, Neurological problems, and Abdominal problems.  ? ? ?GOUT ?Duration: 1 week ago ?Right 1st metatarsophalangeal pain: no ?Left 1st metatarsophalangeal pain: no ?Right knee pain: no ?Left knee pain: no ?Severity: 9/10  ?Quality: aching, shooting ?Swelling: yes ?Redness: no ?Trauma: no ?Recent dietary change or indiscretion: no ?Fevers: no ?Nausea/vomiting: no ?Aggravating factors: ?Alleviating factors:  ?Status:  stable ?Treatments attempted: prednisone  ? ?Active Ambulatory Problems  ?  Diagnosis Date Noted  ? Heart failure, type unknown (HCC) 07/12/2021  ? OSA (obstructive sleep apnea) 07/12/2021  ? Essential hypertension 07/12/2021  ? Obesity (BMI 30.0-34.9) 07/12/2021  ? Tobacco use 07/12/2021  ? Gout   ? ?Resolved Ambulatory Problems  ?  Diagnosis Date Noted  ? No Resolved Ambulatory Problems  ? ?Past Medical History:  ?Diagnosis Date  ? CHF (congestive heart failure) (HCC)   ? Hypertension   ? ?Past Surgical History:  ?Procedure Laterality Date  ? HERNIA REPAIR    ? ?Family History  ?Problem Relation Age of Onset   ? Breast cancer Mother   ? Hyperlipidemia Father   ? Coronary artery disease Father   ? Prostate cancer Father   ? Testicular cancer Brother   ? ? ? ?Review of Systems  ?Musculoskeletal:   ?     Left foot pain  ? ?Per HPI unless specifically indicated above ? ?   ?Objective:  ?  ?BP 125/79   Pulse (!) 107   Temp 98.7 ?F (37.1 ?C) (Oral)   Ht 6\' 1"  (1.854 m)   Wt 254 lb 3.2 oz (115.3 kg)   SpO2 98%   BMI 33.54 kg/m?   ?Wt Readings from Last 3 Encounters:  ?09/19/21 254 lb 3.2 oz (115.3 kg)  ?09/13/21 256 lb (116.1 kg)  ?08/22/21 253 lb (114.8 kg)  ?  ?Physical Exam ?Vitals and nursing note reviewed.  ?Constitutional:   ?   General: He is not in acute distress. ?   Appearance: Normal appearance. He is not ill-appearing, toxic-appearing or diaphoretic.  ?HENT:  ?   Head: Normocephalic.  ?   Right Ear: External ear normal.  ?   Left Ear: External ear normal.  ?   Nose: Nose normal. No congestion or rhinorrhea.  ?   Mouth/Throat:  ?   Mouth: Mucous membranes are moist.  ?Eyes:  ?   General:     ?   Right eye: No discharge.     ?  Left eye: No discharge.  ?   Extraocular Movements: Extraocular movements intact.  ?   Conjunctiva/sclera: Conjunctivae normal.  ?   Pupils: Pupils are equal, round, and reactive to light.  ?Cardiovascular:  ?   Rate and Rhythm: Normal rate and regular rhythm.  ?   Heart sounds: No murmur heard. ?Pulmonary:  ?   Effort: Pulmonary effort is normal. No respiratory distress.  ?   Breath sounds: Normal breath sounds. No wheezing, rhonchi or rales.  ?Abdominal:  ?   General: Abdomen is flat. Bowel sounds are normal.  ?Musculoskeletal:  ?   Cervical back: Normal range of motion and neck supple.  ?     Feet: ? ?Skin: ?   General: Skin is warm and dry.  ?   Capillary Refill: Capillary refill takes less than 2 seconds.  ?Neurological:  ?   General: No focal deficit present.  ?   Mental Status: He is alert and oriented to person, place, and time.  ?Psychiatric:     ?   Mood and Affect: Mood  normal.     ?   Behavior: Behavior normal.     ?   Thought Content: Thought content normal.     ?   Judgment: Judgment normal.  ? ? ?Results for orders placed or performed in visit on 08/22/21  ?Basic metabolic panel  ?Result Value Ref Range  ? Sodium 137 135 - 145 mmol/L  ? Potassium 3.9 3.5 - 5.1 mmol/L  ? Chloride 102 98 - 111 mmol/L  ? CO2 27 22 - 32 mmol/L  ? Glucose, Bld 147 (H) 70 - 99 mg/dL  ? BUN 12 6 - 20 mg/dL  ? Creatinine, Ser 1.22 0.61 - 1.24 mg/dL  ? Calcium 9.2 8.9 - 10.3 mg/dL  ? GFR, Estimated >60 >60 mL/min  ? Anion gap 8 5 - 15  ? ?   ?Assessment & Plan:  ? ?Problem List Items Addressed This Visit   ? ?  ? Cardiovascular and Mediastinum  ? Heart failure, type unknown (HCC) - Primary  ?  Followed by HF clinic.  Will follow up in 1 month for reevaluation. ?  ?  ? Essential hypertension  ?  Chronic.  Controlled.  Continue with current medication regimen.  Will draw labs at next visit. Return to clinic in 1 months for reevaluation.  Call sooner if concerns arise.  ? ?  ?  ?  ? Other  ? Gout  ?  Will treat with Colchicine.  Patient already completed course of prednisone.  Will obtain xray of foot for further evaluation. Will make recommendations based on imaging results.  Toradol given in office.  Follow up in 1 month for reevaluation.  ?  ?  ? Relevant Medications  ? colchicine 0.6 MG tablet  ? ketorolac (TORADOL) injection 60 mg  ? Other Relevant Orders  ? DG Foot Complete Left  ? DG Foot Complete Left  ? ?Other Visit Diagnoses   ? ? Encounter to establish care      ? ?  ?  ? ?Follow up plan: ?Return in about 1 month (around 10/20/2021). ? ? ? ? ? ?

## 2021-09-19 NOTE — Assessment & Plan Note (Signed)
Will treat with Colchicine.  Patient already completed course of prednisone.  Will obtain xray of foot for further evaluation. Will make recommendations based on imaging results.  Toradol given in office.  Follow up in 1 month for reevaluation.  ?

## 2021-09-19 NOTE — Assessment & Plan Note (Signed)
Chronic.  Controlled.  Continue with current medication regimen.  Will draw labs at next visit.  Return to clinic in 1 months for reevaluation.  Call sooner if concerns arise.  °

## 2021-09-19 NOTE — Assessment & Plan Note (Signed)
Followed by HF clinic.  Will follow up in 1 month for reevaluation. ?

## 2021-09-20 ENCOUNTER — Ambulatory Visit
Admission: RE | Admit: 2021-09-20 | Discharge: 2021-09-20 | Disposition: A | Discharge status: Home / Self Care | Payer: 59 | Source: Ambulatory Visit | Attending: Nurse Practitioner | Admitting: Nurse Practitioner

## 2021-09-20 ENCOUNTER — Ambulatory Visit
Admission: RE | Admit: 2021-09-20 | Discharge: 2021-09-20 | Disposition: A | Discharge status: Home / Self Care | Payer: 59 | Source: Home / Self Care | Attending: Nurse Practitioner | Admitting: Nurse Practitioner

## 2021-09-20 DIAGNOSIS — M109 Gout, unspecified: Secondary | ICD-10-CM | POA: Diagnosis not present

## 2021-09-21 NOTE — Addendum Note (Signed)
Addended by: Jon Billings on: 09/21/2021 02:58 PM ? ? Modules accepted: Orders ? ?

## 2021-09-21 NOTE — Progress Notes (Signed)
Please let patient know that his xray shows that he has a heel spur and arthritis.  I recommend he see a podiatrist for further evaluation of his pain.  I think the pain is likely related to the heel spur not gout.  If he agrees, I will place the referral.

## 2021-09-21 NOTE — Progress Notes (Signed)
I'm not able to give pain medications.  He will need to see specialist for further evaluation and treatment.  I placed the referral for podiatry.

## 2021-09-27 NOTE — Progress Notes (Signed)
? ?Cardiology Office Note:   ? ?Date:  09/27/2021  ? ?ID:  Kirk Lyons, DOB [birth year], MRN [id number] ? ?PCP:  Larae Grooms, NP ?  ?[facility] Providers ?Cardiologist:  Chilton Si, MD  ?   ? ?Referring MD: No ref. provider found  ? ?Follow-up for hypertension ? ?History of Present Illness:   ? ?Kirk Lyons is a 51 y.o. male with a hx of heart failure, OSA, hypertension, obesity, elevated glucose, and gout. ? ?He was seen by Dr. Duke Salvia on 07/12/2021.  During that time it was noted that he had been seen in the emergency department on 04/27/2021.  He had fallen out of bed and struck his head as well as the right side of his face.  He was referred to the advanced hypertension clinic.  He reported that he had a's CPAP machine but had not been wearing it.  He reported frequently falling out of bed.  He was also seen in the emergency department 11/22 with worsening shortness of breath and cough.  He reported middle chest and epigastric tightness as well as orthopnea.  He had been out of all of his medications the previous week.  His EKG showed sinus rhythm with a rate of 99.  His chest x-ray showed mild pulmonary edema.  He was hypertensive with a blood pressure of 191/100 on room air.  His troponins were 38 and 40.  It was felt that his elevated troponins were related to demand ischemia.  He was started on IV furosemide.  Admission was recommended but he declined.  His symptoms improved and he was discharged on 20 mg of furosemide.  He was continued on his lisinopril.  Later he was transitioned to [facility] by Clarisa Kindred FNP. ? ? On 07/12/2021 he reported that he was feeling good overall.  He noted that his blood pressure had been high for years.  He had been initially treated with medication 6-8 months prior to his emergency room visit 11/22.  He had first been started on amlodipine which was then switched to lisinopril.  He switched to [facility] 2 weeks prior to his follow-up visit.  He noticed  improvement overnight.  He was able to climb stairs without being short of breath.  He was waking up around 4 AM to take his first dose of antihypertensive medication.  He was taken his second dose of antihypertensive medication around 2 PM.  He reported that his breathing at night was mixed.  He reported being fearful of sleep.  He was typically sleeping propped up.  He reported that when he usually wakes up his CPAP device is disconnected.  He reported being fairly fatigued most of the time.  He works second and third shifts.  He reported episodes of falling asleep standing up.  He also noted episodes of significant lower extremity swelling.  He reported falling asleep at family functions while sitting up shortly after eating.  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  He was watching his diet well.  He grew up on a vegetarian diet.  He reported eating salads at work.  He also reported eating cereals and fruits.  He would only add salt to his eggs and potatoes.  He denied drinking caffeine.  He was using over-the-counter probiotics.  He reported drinking a beer about once a month.  He also reported having a cigarette or 2.  He was a regular smoker up until about 10 years ago.  He denied  palpitations, chest pain, lightheadedness, headaches, and syncope.  It was noted that his BNP was elevated at the hospital.  His Entresto, Lasix, and carvedilol were continued.  He was started on Jardiance.  He was instructed to maintain a blood pressure log.  He was provided with a social work consult for help with transportation. ? ?He presents to the clinic today for follow-up evaluation states he feels fairly well today.  We reviewed his medications and indicates that he has been taking furosemide 40 mg twice daily and Jardiance 10 mg twice daily as well.  The indicates that he has been taking his medications this way for the past 3 months.  His weight has come down to 254 pounds.  He continues to be a  mainly vegetarian diet and walk around 2 miles daily.  He continues to work about 30 hours/week.  We reviewed his echocardiogram he expressed understanding.  He is scheduled to repeat his sleep study.  He is scheduled to repeat his study tonight but was unable to miss work.  He is in the process of rescheduling his sleep test.  He feels that he will not be able to wear a CPAP.  He reports that he has a CPAP machine but has broken several masks and is unable to wear it.  He is interested in the inspire implant of dental appliance.  I will order a BMP, fasting lipids and LFTs, and have him continue his current diet and exercise.  I have asked him to only take Jardiance daily.  I will make recommendations on furosemide once his lab work returns.  We will plan follow-up for 6 months. ? ? ?Today he denies chest pain, shortness of breath, lower extremity edema, fatigue, palpitations, melena, hematuria, hemoptysis, diaphoresis, weakness, presyncope, syncope, orthopnea, and PND. ? ? ?Previous antihypertensives: ?Amlodipine ?Lisinopril ? ?Past Medical History:  ?Diagnosis Date  ? CHF (congestive heart failure) (HCC)   ? Essential hypertension 07/12/2021  ? Gout   ? Heart failure, type unknown (HCC) 07/12/2021  ? Hypertension   ? Obesity (BMI 30.0-34.9) 07/12/2021  ? OSA (obstructive sleep apnea) 07/12/2021  ? Tobacco use 07/12/2021  ? ? ?Past Surgical History:  ?Procedure Laterality Date  ? HERNIA REPAIR    ? ? ?Current Medications: ?No outpatient medications have been marked as taking for the 10/03/21 encounter (Appointment) with Ronney Asters, NP.  ?  ? ?Allergies:   Erythromycin  ? ?Social History  ? ?Socioeconomic History  ? Marital status: Single  ?  Spouse name: Not on file  ? Number of children: Not on file  ? Years of education: Not on file  ? Highest education level: Not on file  ?Occupational History  ? Not on file  ?Tobacco Use  ? Smoking status: Some Days  ?  Types: Cigarettes  ? Smokeless tobacco: Never  ?  Tobacco comments:  ?  May smoke a cigarette if he has a beer  ?Vaping Use  ? Vaping Use: Never used  ?Substance and Sexual Activity  ? Alcohol use: Not Currently  ?  Comment: social  ? Drug use: No  ? Sexual activity: Not Currently  ?Other Topics Concern  ? Not on file  ?Social History Narrative  ? Not on file  ? ?Social Determinants of Health  ? ?Financial Resource Strain: High Risk  ? Difficulty of Paying Living Expenses: Very hard  ?Food Insecurity: No Food Insecurity  ? Worried About Programme researcher, broadcasting/film/video in the Last Year: Never true  ?  Ran Out of Food in the Last Year: Never true  ?Transportation Needs: Unmet Transportation Needs  ? Lack of Transportation (Medical): No  ? Lack of Transportation (Non-Medical): Yes  ?Physical Activity: Unknown  ? Days of Exercise per Week: 5 days  ? Minutes of Exercise per Session: Not on file  ?Stress: Stress Concern Present  ? Feeling of Stress : Very much  ?Social Connections: Socially Isolated  ? Frequency of Communication with Friends and Family: More than three times a week  ? Frequency of Social Gatherings with Friends and Family: Once a week  ? Attends Religious Services: Never  ? Active Member of Clubs or Organizations: No  ? Attends BankerClub or Organization Meetings: Never  ? Marital Status: Never married  ?  ? ?Family History: ?The patient's family history includes Breast cancer in his mother; Coronary artery disease in his father; Hyperlipidemia in his father; Prostate cancer in his father; Testicular cancer in his brother. ? ?ROS:   ?Please see the history of present illness.    ? All other systems reviewed and are negative. ? ? ?Risk Assessment/Calculations:   ?  ? ?    ? ?Physical Exam:   ? ?VS:  There were no vitals taken for this visit.   ? ?Wt Readings from Last 3 Encounters:  ?09/19/21 254 lb 3.2 oz (115.3 kg)  ?09/13/21 256 lb (116.1 kg)  ?08/22/21 253 lb (114.8 kg)  ?  ? ?GEN:  Well nourished, well developed in no acute distress ?HEENT: Normal ?NECK: No JVD; No  carotid bruits ?LYMPHATICS: No lymphadenopathy ?CARDIAC: RRR, no murmurs, rubs, gallops ?RESPIRATORY:  Clear to auscultation without rales, wheezing or rhonchi  ?ABDOMEN: Soft, non-tender, non-distended ?MUSCULOS

## 2021-10-02 NOTE — Progress Notes (Deleted)
Patient ID: Kirk Lyons, male    DOB: 1970-10-26, 51 y.o.   MRN: [id number]   Kirk Lyons is a 51 y/o male with a history of HTN, gout, tobacco use and chronic heart failure.   Echo 07/20/21: Left Ventricle: Left ventricular ejection fraction, by estimation, is 45 to 50%. The left ventricle has mildly decreased function. The left ventricle demonstrates global hypokinesis. The left ventricular internal cavity size was normal in size. There is  mild left ventricular hypertrophy. Left ventricular diastolic parameters are consistent with Grade II diastolic dysfunction (pseudonormalization).  Right Ventricle: The right ventricular size is normal. Moderately  increased right ventricular wall thickness. Right ventricular systolic function is normal. Tricuspid regurgitation signal is inadequate for assessing PA pressure.  Left Atrium: Left atrial size was mildly dilated.  Right Atrium: Right atrial size was mildly dilated   Was in the ED 06/09/21 due to shortness of breath and cough after being out of medications for ~ 1 week. Elevated troponin thought to be due to demand ischemia. Given IV lasix and admission recommended but patient declined. Symptoms improved and he was released.   He presents today for a follow-up visit with a chief complaint of minimal fatigue upon moderate exertion. He describes this as chronic in nature having been present for "quite awhile". He has associated dry cough, palpitations, dizziness, anxiety and difficulty sleeping along with this. He denies any shortness of breath, chest pain, pedal edema, abdominal distention or weight gain.   Saw cardiology on 07/12/22, he states they called him to say "you have diabetes" but he personally did not believe this was true. Reviewed his A1C with him, explained that he does indeed have diabetes and needs to pick up the metformin that was sent in for him.  Past Medical History:  Diagnosis Date   CHF (congestive heart failure) ([place])     Essential hypertension 07/12/2021   Gout    Heart failure, type unknown ([place]) 07/12/2021   Hypertension    Obesity (BMI 30.0-34.9) 07/12/2021   OSA (obstructive sleep apnea) 07/12/2021   Tobacco use 07/12/2021   Past Surgical History:  Procedure Laterality Date   HERNIA REPAIR     Family History  Problem Relation Age of Onset   Breast cancer Mother    Hyperlipidemia Father    Coronary artery disease Father    Prostate cancer Father    Testicular cancer Brother    Social History   Tobacco Use   Smoking status: Some Days    Types: Cigarettes   Smokeless tobacco: Never   Tobacco comments:    May smoke a cigarette if he has a beer  Substance Use Topics   Alcohol use: Not Currently    Comment: social   Allergies  Allergen Reactions   Erythromycin Nausea And Vomiting    vomiting vomiting   Prior to Admission medications   Medication Sig Start Date End Date Taking? Authorizing Provider  albuterol (VENTOLIN HFA) 108 (90 Base) MCG/ACT inhaler Inhale 2 puffs into the lungs every 6 (six) hours as needed for wheezing or shortness of breath. 06/06/20  Yes Blake Divine, MD  furosemide (LASIX) 20 MG tablet Take 1 tablet (20 mg total) by mouth daily. 06/23/21 07/23/21 Yes Hackney, Tina A, FNP  sacubitril-valsartan (ENTRESTO) 24-26 MG Take 1 tablet by mouth 2 (two) times daily. 06/23/21  Yes Hackney, Otila Kluver A, FNP  allopurinol (ZYLOPRIM) 100 MG tablet Take 1 tablet (100 mg total) by mouth daily. Patient not taking: Reported on 06/17/2021  08/04/20 08/04/21  Caryn Section Linden Dolin, PA-C  Multiple Vitamins-Minerals (EMERGEN-C VITAMIN C) PACK Take 1 Package by mouth as needed (immune system support).    [provider]   Review of Systems  Constitutional:  Positive for appetite change (decreased) and fatigue.  HENT:  Negative for congestion, postnasal drip and sore throat.   Eyes: Negative.   Respiratory:  Positive for cough. Negative for chest tightness and shortness of breath.    Cardiovascular:  Positive for palpitations. Negative for chest pain and leg swelling.  Gastrointestinal:  Negative for abdominal distention and abdominal pain.  Endocrine: Negative.   Genitourinary: Negative.   Musculoskeletal:  Negative for back pain and neck pain.  Skin: Negative.   Allergic/Immunologic: Negative.   Neurological:  Positive for dizziness (at times). Negative for light-headedness.  Hematological:  Negative for adenopathy. Does not bruise/bleed easily.  Psychiatric/Behavioral:  Positive for decreased concentration and sleep disturbance. Negative for dysphoric mood. The patient is nervous/anxious.    There were no vitals filed for this visit.  Wt Readings from Last 3 Encounters:  09/19/21 254 lb 3.2 oz (115.3 kg)  09/13/21 256 lb (116.1 kg)  08/22/21 253 lb (114.8 kg)   Lab Results  Component Value Date   CREATININE 1.22 08/22/2021   CREATININE 1.10 07/12/2021   CREATININE 1.05 07/06/2021   Physical Exam Vitals and nursing note reviewed.  Constitutional:      General: He is not in acute distress.    Appearance: He is well-developed.  HENT:     Head: Normocephalic and atraumatic.  Neck:     Vascular: No JVD.  Cardiovascular:     Rate and Rhythm: Regular rhythm. Tachycardia present.  Pulmonary:     Effort: Pulmonary effort is normal.     Breath sounds: No wheezing, rhonchi or rales.  Abdominal:     Palpations: Abdomen is soft.     Tenderness: There is no abdominal tenderness.  Musculoskeletal:     Cervical back: Normal range of motion and neck supple.     Right lower leg: No edema.     Left lower leg: No edema.  Skin:    General: Skin is warm and dry.  Neurological:     General: No focal deficit present.     Mental Status: He is alert and oriented to person, place, and time.  Psychiatric:        Mood and Affect: Mood is anxious.        Behavior: Behavior normal.   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- - NYHA class II  - euvolemic today - not weighing daily; reminded to weigh daily so that he can call for an overnight weight gain of > 2 pounds or a weekly weight gain of >5 pounds - weight 253 today, down 1 lb from 2 months ago - not adding salt and tries to review food labels - saw cardiology Oval Linsey) on 07/12/22 - has been out of his carvedilol for two weeks, cites financial constraints, but will pick up today once he gets his paycheck - cannot afford entresto, will assist again with patient financial assistance; copies of paychecks sent to [employer] - samples of entresto provided - out of lasix x 4 days, will pick up today once he gets paid - on GDMT of jardiance, entresto and coreg - will check BMP today  - consider adding spironolactone if he can commit to getting labs drawn - BNP 06/20/21 was 494.9  2:  HTN- - BP 147/101, rechecked 152/110 - has been out of carvedilol x 2 weeks and out of furosemide for 4 days - has PCP appt scheduled for March 2023 - BMP 07/12/21 reviewed and showed sodium 140, potassium 4.4, creatinine 1.10 and GFR 82   3: Obstructive sleep apnea-  - says that he hasn't had a complete night sleep in ~ 20 years - roommate reports sleepwalking - completed sleep study - will send parameters orders for auto-CPAP per initial sleep study results - also sent order for sleep lab for him to return for CPAP titration  4: Diabetes mellitus- - A1C on 07/12/21 7.8% - he was skeptical that he has diabetes, advised that he does indeed have diabetes  - recent yeast infection in axilla, he felt it was d/t jardiance, advised it was likely from elevated blood sugar - metformin 500 mg BID called into pharmacy by Dr. Oval Linsey, but patient did not pick up as he did not believe he had DM - he will pick up today and begin taking, discussed possible GI side effects but they are usually mild and self-limiting  5: Hypercholesteremia- - lipid panel checked by Dr. Oval Linsey  and crestor called in - patient advised he could not afford and felt like it was familial  - advised him that it could in fact be hereditary, but that did not preclude him from MI, stroke - he will pick up today and start taking   Medication bottles reviewed.   Return in 6 weeks or sooner for any questions/problems before then.

## 2021-10-03 ENCOUNTER — Ambulatory Visit (INDEPENDENT_AMBULATORY_CARE_PROVIDER_SITE_OTHER): Payer: 59 | Admitting: General Practice

## 2021-10-03 ENCOUNTER — Other Ambulatory Visit: Payer: Self-pay

## 2021-10-03 ENCOUNTER — Ambulatory Visit: Payer: 59 | Admitting: Family

## 2021-10-03 ENCOUNTER — Encounter (HOSPITAL_BASED_OUTPATIENT_CLINIC_OR_DEPARTMENT_OTHER): Payer: Self-pay | Admitting: General Practice

## 2021-10-03 VITALS — BP 110/88 | HR 98 | Ht 73.0 in | Wt 254.0 lb

## 2021-10-03 DIAGNOSIS — G4733 Obstructive sleep apnea (adult) (pediatric): Secondary | ICD-10-CM

## 2021-10-03 DIAGNOSIS — I10 Essential (primary) hypertension: Secondary | ICD-10-CM | POA: Diagnosis not present

## 2021-10-03 DIAGNOSIS — I5032 Chronic diastolic (congestive) heart failure: Secondary | ICD-10-CM | POA: Diagnosis not present

## 2021-10-03 DIAGNOSIS — E669 Obesity, unspecified: Secondary | ICD-10-CM

## 2021-10-03 DIAGNOSIS — E7800 Pure hypercholesterolemia, unspecified: Secondary | ICD-10-CM

## 2021-10-03 LAB — BASIC METABOLIC PANEL
BUN/Creatinine Ratio: 14 (ref 9–20)
BUN: 14 mg/dL (ref 6–24)
CO2: 21 mmol/L (ref 20–29)
Calcium: 9.3 mg/dL (ref 8.7–10.2)
Chloride: 104 mmol/L (ref 96–106)
Creatinine, Ser: 1.02 mg/dL (ref 0.76–1.27)
Glucose: 183 mg/dL — ABNORMAL HIGH (ref 70–99)
Potassium: 4.3 mmol/L (ref 3.5–5.2)
Sodium: 141 mmol/L (ref 134–144)
eGFR: 90 mL/min/{1.73_m2} (ref >59)

## 2021-10-03 MED ORDER — FUROSEMIDE 40 MG PO TABS: 40.0000 mg | ORAL_TABLET | Freq: Every day | ORAL | 3 refills | Status: DC

## 2021-10-03 MED ORDER — EMPAGLIFLOZIN 10 MG PO TABS
10.0000 mg | ORAL_TABLET | Freq: Every day | ORAL | 3 refills | Status: DC
Start: 2021-10-03 — End: 2023-05-21

## 2021-10-03 NOTE — Progress Notes (Signed)
? Patient ID: Kirk Lyons, male    DOB: Sep 30, 1970, 51 y.o.   MRN: [id number] ? ?Kirk Lyons is a 51 y/o male with a history of HTN, gout, tobacco use and chronic heart failure.  ? ?Echo 07/20/21: Left Ventricle: Left ventricular ejection fraction, by estimation, is 45 to 50%. The left ventricle has mildly decreased function. The left ventricle demonstrates global hypokinesis. The left ventricular internal cavity size was normal in size. There is  ?mild left ventricular hypertrophy. Left ventricular diastolic parameters are consistent with Grade II diastolic dysfunction (pseudonormalization). Right Ventricle: The right ventricular size is normal. Moderately increased right ventricular wall thickness.  ?Left Atrium: Left atrial size was mildly dilated. Right Atrium: Right atrial size was mildly dilated ? ?Was in the ED 06/09/21 due to shortness of breath and cough after being out of medications for ~ 1 week. Elevated troponin thought to be due to demand ischemia. Given IV lasix and admission recommended but patient declined. Symptoms improved and he was released.  ? ?He presents today for a follow-up visit with a chief complaint of minimal fatigue upon moderate exertion. He describes this as chronic in nature having been present for "quite a while". He has associated dry cough, palpitations, dizziness, anxiety and difficulty sleeping along with this. He denies any shortness of breath, chest pain, pedal edema, abdominal distention or weight gain.  ? ?Saw cardiology on 10/04/21, he was taking his lasix and jardiance BID. Labs were checked and he was instructed to take jardiance once/day and lasix once/day. ? ?He does not weigh daily, but says he feels like he has lost weight.  ? ?Past Medical History:  ?Diagnosis Date  ? CHF (congestive heart failure) ([place])   ? Essential hypertension 07/12/2021  ? Gout   ? Heart failure, type unknown ([place]) 07/12/2021  ? Hypertension   ? Obesity (BMI 30.0-34.9) 07/12/2021  ? OSA  (obstructive sleep apnea) 07/12/2021  ? Tobacco use 07/12/2021  ? ?Past Surgical History:  ?Procedure Laterality Date  ? HERNIA REPAIR    ? ?Family History  ?Problem Relation Age of Onset  ? Breast cancer Mother   ? Hyperlipidemia Father   ? Coronary artery disease Father   ? Prostate cancer Father   ? Testicular cancer Brother   ? ?Social History  ? ?Tobacco Use  ? Smoking status: Some Days  ?  Types: Cigarettes  ? Smokeless tobacco: Never  ? Tobacco comments:  ?  May smoke a cigarette if he has a beer  ?Substance Use Topics  ? Alcohol use: Not Currently  ?  Comment: social  ? ?Allergies  ?Allergen Reactions  ? Erythromycin Nausea And Vomiting  ?  vomiting ?vomiting  ? ?Prior to Admission medications   ?Medication Sig Start Date End Date Taking? Authorizing Provider  ?albuterol (VENTOLIN HFA) 108 (90 Base) MCG/ACT inhaler Inhale 2 puffs into the lungs every 6 (six) hours as needed for wheezing or shortness of breath. 06/06/20  Yes Blake Divine, MD  ?furosemide (LASIX) 20 MG tablet Take 1 tablet (20 mg total) by mouth daily. 06/23/21 07/23/21 Yes Alisa Graff, FNP  ?sacubitril-valsartan (ENTRESTO) 24-26 MG Take 1 tablet by mouth 2 (two) times daily. 06/23/21  Yes Darylene Price A, FNP  ?allopurinol (ZYLOPRIM) 100 MG tablet Take 1 tablet (100 mg total) by mouth daily. ?Patient not taking: Reported on 06/17/2021 08/04/20 08/04/21  Versie Starks, PA-C  ?Multiple Vitamins-Minerals (EMERGEN-C VITAMIN C) PACK Take 1 Package by mouth as needed (immune system support).  [provider]  ? ?Review of Systems  ?Constitutional:  Positive for fatigue. Negative for appetite change (decreased).  ?HENT:  Negative for congestion, postnasal drip and sore throat.   ?Eyes: Negative.   ?Respiratory:  Positive for cough. Negative for chest tightness and shortness of breath.   ?Cardiovascular:  Positive for palpitations. Negative for chest pain and leg swelling.  ?Gastrointestinal:  Negative for abdominal distention and  abdominal pain.  ?Endocrine: Negative.   ?Genitourinary: Negative.   ?Musculoskeletal:  Negative for back pain and neck pain.  ?Skin: Negative.   ?Allergic/Immunologic: Negative.   ?Neurological:  Negative for dizziness and light-headedness.  ?Hematological:  Negative for adenopathy. Does not bruise/bleed easily.  ?Psychiatric/Behavioral:  Positive for sleep disturbance. Negative for decreased concentration and dysphoric mood. The patient is not nervous/anxious.   ? ?Vitals:  ? 10/04/21 1354 10/04/21 1437  ?BP: (!) 152/92 110/88  ?Pulse: (!) 107   ?Resp: 20   ?SpO2: 94%   ?Weight: 253 lb 4 oz (114.9 kg)   ?Height: 6\' 1"  (1.854 m)   ? ?Wt Readings from Last 3 Encounters:  ?10/04/21 253 lb 4 oz (114.9 kg)  ?10/03/21 254 lb (115.2 kg)  ?09/19/21 254 lb 3.2 oz (115.3 kg)  ? ?Lab Results  ?Component Value Date  ? CREATININE 1.22 08/22/2021  ? CREATININE 1.10 07/12/2021  ? CREATININE 1.05 07/06/2021  ? ?Physical Exam ?Vitals and nursing note reviewed.  ?Constitutional:   ?   General: He is not in acute distress. ?   Appearance: He is well-developed.  ?HENT:  ?   Head: Normocephalic and atraumatic.  ?Neck:  ?   Vascular: No JVD.  ?Cardiovascular:  ?   Rate and Rhythm: Regular rhythm. Tachycardia present.  ?Pulmonary:  ?   Effort: Pulmonary effort is normal.  ?   Breath sounds: No wheezing, rhonchi or rales.  ?Abdominal:  ?   Palpations: Abdomen is soft.  ?   Tenderness: There is no abdominal tenderness.  ?Musculoskeletal:  ?   Cervical back: Normal range of motion and neck supple.  ?   Right lower leg: No edema.  ?   Left lower leg: No edema.  ?Skin: ?   General: Skin is warm and dry.  ?Neurological:  ?   General: No focal deficit present.  ?   Mental Status: He is alert and oriented to person, place, and time.  ?Psychiatric:     ?   Mood and Affect: Mood is anxious.     ?   Behavior: Behavior normal.  ? ?Assessment & Plan: ? 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- ?- NYHA class II  ?- euvolemic  today ?- not weighing daily; reminded to weigh daily so that he can call for an overnight weight gain of > 2 pounds or a weekly weight gain of >5 pounds ?- weight 253 today, unchanged since last visit ?- not adding salt and tries to review food labels ?- saw cardiology Marilynn Rail) on 10/03/21, he was taking his jardiance and lasix BID; BMP was checked and he is waiting to hear back on how often he should take his lasix ?- cannot afford entresto, will assist again with patient financial assistance; copies of paychecks sent to [employer] ?- samples of entresto provided ?- on GDMT of jardiance, entresto and coreg ?- consider adding spironolactone at next visit ?- BNP 06/20/21 was 494.9 ? ?2: HTN- ?- BP 110/88 ?- has PCP appt scheduled for 10/17/21 ?- BMP 10/03/21 reviewed and showed sodium  141, potassium 4.3, creatinine 1.02 and GFR 90  ? ?3: Obstructive sleep apnea-  ?- says that he hasn't had a complete night sleep in ~ 20 years ?- has repeat sleep study on 10/07/21 ? ?4: Diabetes mellitus- ?- A1C on 07/12/21 7.8% ?- taking glucophage once/day; he was not aware he was to take BID; will start taking BID on 10/05/21 ? ?5: Hypercholesteremia- ?- rosuvastatin ?- vegetarian diet ? ? ?Medication bottles reviewed and clarified in detail how to take his medications and typed on on AVS.  ? ?Return in 3 months or sooner for any questions/problems before then.  ? ? ? ? ?

## 2021-10-03 NOTE — Patient Instructions (Signed)
Medication Instructions:  ?Your physician has recommended you make the following change in your medication:  ? ?Change: Jardiance 10mg  tablet once daily  ? ?Change: Furosemide (lasix) 40mg  daily  ? ?*If you need a refill on your cardiac medications before your next appointment, please call your pharmacy* ? ? ?Lab Work: ?Your physician recommends that you return for lab work today- BMET  ? ?Please return for Lab work within the next week or two for fasting Lipid Panel and Liver Function Tests.  You may come to the...  ? ?[facility] (3rd floor) ?[address] [place], [address] [place]  ?Open: 8am-Noon and 1pm-4:30pm  ? ?[facility] ?[address]  ? ?[address]- Any location ? ?**no appointments needed** ? ?If you have labs (blood work) drawn today and your tests are completely normal, you will receive your results only by: ?MyChart Message (if you have MyChart) OR ?A paper copy in the mail ?If you have any lab test that is abnormal or we need to change your treatment, we will call you to review the results. ? ? ?Testing/Procedures: ?None ordered today  ? ? ?Follow-Up: ?At [facility], you and your health needs are our priority.  As part of our continuing mission to provide you with exceptional heart care, we have created designated Provider Care Teams.  These Care Teams include your primary Cardiologist (physician) and Advanced Practice Providers (APPs -  Physician Assistants and Nurse Practitioners) who all work together to provide you with the care you need, when you need it. ? ?We recommend signing up for the patient portal called "MyChart".  Sign up information is provided on this After Visit Summary.  MyChart is used to connect with patients for Virtual Visits (Telemedicine).  Patients are able to view lab/test results, encounter notes, upcoming appointments, etc.  Non-urgent messages can be sent to your provider as well.   ?To learn more about what  you can do with MyChart, go to [employer].   ? ?Your next appointment:   ?6 month(s) ? ?The format for your next appointment:   ?In Person ? ?Provider:   ?[facility], MD{ ? ?Other Instructions ?Exercise recommendations: ?The American Heart Association recommends 150 minutes of moderate intensity exercise weekly. ?Try 30 minutes of moderate intensity exercise 4-5 times per week. ?This could include walking, jogging, or swimming. ? ? ?

## 2021-10-04 ENCOUNTER — Ambulatory Visit: Payer: 59 | Attending: Family | Admitting: Family

## 2021-10-04 ENCOUNTER — Encounter: Payer: Self-pay | Admitting: Family

## 2021-10-04 VITALS — BP 110/88 | HR 107 | Resp 20 | Ht 73.0 in | Wt 253.2 lb

## 2021-10-04 DIAGNOSIS — G4733 Obstructive sleep apnea (adult) (pediatric): Secondary | ICD-10-CM | POA: Diagnosis not present

## 2021-10-04 DIAGNOSIS — F17210 Nicotine dependence, cigarettes, uncomplicated: Secondary | ICD-10-CM | POA: Diagnosis not present

## 2021-10-04 DIAGNOSIS — I5022 Chronic systolic (congestive) heart failure: Secondary | ICD-10-CM | POA: Insufficient documentation

## 2021-10-04 DIAGNOSIS — Z7984 Long term (current) use of oral hypoglycemic drugs: Secondary | ICD-10-CM | POA: Insufficient documentation

## 2021-10-04 DIAGNOSIS — I10 Essential (primary) hypertension: Secondary | ICD-10-CM | POA: Diagnosis not present

## 2021-10-04 DIAGNOSIS — E119 Type 2 diabetes mellitus without complications: Secondary | ICD-10-CM | POA: Insufficient documentation

## 2021-10-04 DIAGNOSIS — I110 Hypertensive heart disease with heart failure: Secondary | ICD-10-CM | POA: Insufficient documentation

## 2021-10-04 DIAGNOSIS — M109 Gout, unspecified: Secondary | ICD-10-CM | POA: Diagnosis not present

## 2021-10-04 DIAGNOSIS — E7800 Pure hypercholesterolemia, unspecified: Secondary | ICD-10-CM | POA: Diagnosis not present

## 2021-10-04 DIAGNOSIS — Z79899 Other long term (current) drug therapy: Secondary | ICD-10-CM | POA: Diagnosis not present

## 2021-10-04 NOTE — Patient Instructions (Signed)
Carvedilol, twice per day ? ?Entresto, half the pill and take twice/day ? ?Rosuvastatin, take one at night ? ?Metformin, take twice per day ? ?Lasix take once per day UNTIL YOU HEAR FROM JESSE ? ?Jardiance, once per day  ? ?Keep up the good work!!!! ? ?

## 2021-10-07 ENCOUNTER — Ambulatory Visit: Payer: 59 | Attending: Otolaryngology

## 2021-10-07 DIAGNOSIS — G4733 Obstructive sleep apnea (adult) (pediatric): Secondary | ICD-10-CM | POA: Insufficient documentation

## 2021-10-07 DIAGNOSIS — G4761 Periodic limb movement disorder: Secondary | ICD-10-CM | POA: Insufficient documentation

## 2021-10-10 ENCOUNTER — Other Ambulatory Visit: Payer: Self-pay

## 2021-10-13 ENCOUNTER — Encounter: Payer: Self-pay | Admitting: Emergency Medicine

## 2021-10-13 ENCOUNTER — Emergency Department
Admission: EM | Admit: 2021-10-13 | Discharge: 2021-10-13 | Disposition: A | Discharge status: Home / Self Care | Payer: 59 | Attending: Emergency Medicine | Admitting: Emergency Medicine

## 2021-10-13 ENCOUNTER — Other Ambulatory Visit: Payer: Self-pay

## 2021-10-13 DIAGNOSIS — R55 Syncope and collapse: Secondary | ICD-10-CM | POA: Diagnosis not present

## 2021-10-13 DIAGNOSIS — I10 Essential (primary) hypertension: Secondary | ICD-10-CM | POA: Diagnosis not present

## 2021-10-13 DIAGNOSIS — R5383 Other fatigue: Secondary | ICD-10-CM | POA: Diagnosis not present

## 2021-10-13 DIAGNOSIS — R739 Hyperglycemia, unspecified: Secondary | ICD-10-CM | POA: Diagnosis not present

## 2021-10-13 LAB — CBC
HCT: 49.9 % (ref 39.0–52.0)
Hemoglobin: 17.1 g/dL — ABNORMAL HIGH (ref 13.0–17.0)
MCH: 31.3 pg (ref 26.0–34.0)
MCHC: 34.3 g/dL (ref 30.0–36.0)
MCV: 91.4 fL (ref 80.0–100.0)
Platelets: 281 10*3/uL (ref 150–400)
RBC: 5.46 MIL/uL (ref 4.22–5.81)
RDW: 13.2 % (ref 11.5–15.5)
WBC: 9.2 10*3/uL (ref 4.0–10.5)
nRBC: 0 % (ref 0.0–0.2)

## 2021-10-13 LAB — BASIC METABOLIC PANEL
Anion gap: 8 (ref 5–15)
BUN: 15 mg/dL (ref 6–20)
CO2: 26 mmol/L (ref 22–32)
Calcium: 8.7 mg/dL — ABNORMAL LOW (ref 8.9–10.3)
Chloride: 105 mmol/L (ref 98–111)
Creatinine, Ser: 0.81 mg/dL (ref 0.61–1.24)
GFR, Estimated: >60 mL/min (ref >60)
Glucose, Bld: 209 mg/dL — ABNORMAL HIGH (ref 70–99)
Potassium: 3.9 mmol/L (ref 3.5–5.1)
Sodium: 139 mmol/L (ref 135–145)

## 2021-10-13 LAB — TROPONIN I (HIGH SENSITIVITY): Troponin I (High Sensitivity): 12 ng/L (ref <18)

## 2021-10-13 NOTE — ED Triage Notes (Signed)
Patient ambulatory to triage with steady gait, without difficulty or distress noted; pt reports while at work PTA, had sudden onset of feeling faint; denies any c/o at present ?

## 2021-10-13 NOTE — ED Provider Notes (Signed)
? ?[facility] ?Provider Note ? ? ? Event Date/Time  ? First MD Initiated Contact with Patient 10/13/21 [phone]   ?  (approximate) ? ? ?History  ? ?Near Syncope ? ? ?HPI ? ?Kirk Lyons is a 51 y.o. male whose medical history includes but is not limited to CHF, hypertension, and OSA.  He presents tonight by private vehicle for evaluation of near syncope.  He said that he was at work and standing up but Having his eyes closed and feeling like he was falling asleep.  He said that at one point he had a cute onset of feeling faint.  Nothing particular made the symptoms better or worse.  At no point did he have any chest pain or shortness of breath.  He also denies headache, nausea, vomiting, and diarrhea.  He has been eating and drinking normally and taking his medications although he says it is time to take his medicines again because he usually takes them at 3 AM.  He had to leave work early and then went home and took a shower and then came into the ED. ?  ?He has been sleeping here and said he feels fine, he just wants to make sure everything is okay.  He said he recently completed second part of the sleep study but does not yet have the results.  He does not use a CPAP at night. ? ? ?Physical Exam  ? ?Triage Vital Signs: ?ED Triage Vitals  ?Enc Vitals Group  ?   BP 10/13/21 0007 (!) 168/132  ?   Pulse Rate 10/13/21 0007 100  ?   Resp 10/13/21 0007 18  ?   Temp 10/13/21 0007 98.1 ?F (36.7 ?C)  ?   Temp Source 10/13/21 0007 Oral  ?   SpO2 10/13/21 0007 100 %  ?   Weight 10/13/21 0008 111.1 kg (245 lb)  ?   Height 10/13/21 0008 1.854 m (6\' 1" )  ?   Head Circumference --   ?   Peak Flow --   ?   Pain Score 10/13/21 0007 0  ?   Pain Loc --   ?   Pain Edu? --   ?   Excl. in GC? --   ? ? ?Most recent vital signs: ?Vitals:  ? 10/13/21 0305 10/13/21 0434  ?BP: (!) 190/127 (!) 181/117  ?Pulse: 88 79  ?Resp: 18 20  ?Temp:    ?SpO2: 95% 100%  ? ? ? ?General: Awake, no distress.  ?CV:  Good peripheral  perfusion.  Normal heart sounds. ?Resp:  Normal effort.  No wheezing, rales, nor rhonchi. ?Abd:  No distention.  No tenderness to palpation ? ? ?ED Results / Procedures / Treatments  ? ?Labs ?(all labs ordered are listed, but only abnormal results are displayed) ?Labs Reviewed  ?CBC - Abnormal; Notable for the following components:  ?    Result Value  ? Hemoglobin 17.1 (*)   ? All other components within normal limits  ?BASIC METABOLIC PANEL - Abnormal; Notable for the following components:  ? Glucose, Bld 209 (*)   ? Calcium 8.7 (*)   ? All other components within normal limits  ?TROPONIN I (HIGH SENSITIVITY)  ? ? ? ?EKG ? ?ED ECG REPORT ?I04/01/23, the attending physician, personally viewed and interpreted this ECG. ? ?Date: 10/13/2021 ?EKG Time: 00: 10 ?Rate: 97 ?Rhythm: normal sinus rhythm ?QRS Axis: Rightward deviation ?Intervals: Incomplete right bundle branch block ?ST/T Wave abnormalities: Non-specific ST segment /  T-wave changes, but no clear evidence of acute ischemia. ?Narrative Interpretation: no definitive evidence of acute ischemia; does not meet STEMI criteria. ? ? ? ? ?PROCEDURES: ? ?Critical Care performed: No ? ?.1-3 Lead EKG Interpretation ?Performed by: Loleta Rose, MD ?Authorized by: Loleta Rose, MD  ? ?  Interpretation: normal   ?  ECG rate:  85 ?  ECG rate assessment: normal   ?  Rhythm: sinus rhythm   ?  Ectopy: none   ?  Conduction: normal   ? ? ?MEDICATIONS ORDERED IN ED: ?Medications - No data to display ? ? ?IMPRESSION / MDM / ASSESSMENT AND PLAN / ED COURSE  ?I reviewed the triage vital signs and the nursing notes. ?             ?               ? ?Differential diagnosis includes, but is not limited to, fatigue, sleep apnea, medication or drug side effect, cardiac arrhythmia, acute infection. ? ?The patient was on the cardiac monitor to evaluate for evidence of arrhythmia and/or significant heart rate changes. ? ?Patient had no pain at any point.  No shortness of breath.  No  actual syncope, just episodes of closing his eyes and briefly feeling faint. ? ?I reviewed his vital signs.  He is hypertensive, both systolic and diastolic, but this is apparently chronic.  He also said that it is time for him to take his medicines and he just wants to take them when he gets home. ? ?He is not taking any new pain medicines or benzodiazepines.  No infectious signs or symptoms, afebrile, no tachycardia.  I reviewed his EKG and there is no sign of ischemia. ? ?Lab work initially ordered includes basic metabolic panel, CBC, and high-sensitivity troponin.  I reviewed the results of all of these and his basic metabolic panel is essentially normal other than some mild hyperglycemia, CBC is within normal limits other than hemoglobin of 17.1 which is not clinically relevant. ? ?Given a lack of chest pain and the fact that these episodes happened hours ago, there is no clear indication for repeating a troponin.  The patient feels ready to go home and get some sleep.  I explained that I do not have a specific explanation for the problem tonight but there does not appear to be any acute or emergent medical condition.  I encouraged him to take his regular medications when he gets home and follow-up with his regular doctor.  I gave my usual and customary return precautions and he understands and agrees with the plan. ? ? ?  ? ? ?FINAL CLINICAL IMPRESSION(S) / ED DIAGNOSES  ? ?Final diagnoses:  ?Near syncope  ?Other fatigue  ?Primary hypertension  ? ? ? ?Rx / DC Orders  ? ?ED Discharge Orders   ? ? None  ? ?  ? ? ? ?Note:  This document was prepared using Dragon voice recognition software and may include unintentional dictation errors. ?  ?Loleta Rose, MD ?10/13/21 [phone] ? ?

## 2021-10-13 NOTE — Discharge Instructions (Signed)
Your workup in the Emergency Department today was reassuring.  We did not find any specific abnormalities.  We recommend you drink plenty of fluids, take your regular medications and/or any new ones prescribed today, and follow up with the doctor(s) listed in these documents as recommended.  Return to the Emergency Department if you develop new or worsening symptoms that concern you.  

## 2021-10-17 ENCOUNTER — Ambulatory Visit: Payer: 59 | Admitting: Nurse Practitioner

## 2021-10-17 DIAGNOSIS — E7800 Pure hypercholesterolemia, unspecified: Secondary | ICD-10-CM | POA: Insufficient documentation

## 2021-10-17 DIAGNOSIS — E1169 Type 2 diabetes mellitus with other specified complication: Secondary | ICD-10-CM | POA: Insufficient documentation

## 2021-10-17 NOTE — Progress Notes (Deleted)
? ?There were no vitals taken for this visit.  ? ?Subjective:  ? ? Patient ID: Kirk Lyons, male    DOB: [birth year], 51 y.o.   MRN: [id number] ? ?HPI: ?Kirk Lyons is a 51 y.o. male ? ?No chief complaint on file. ? ?HYPERTENSION / HYPERLIPIDEMIA ?Satisfied with current treatment? {Blank single:19197::"yes","no"} ?Duration of hypertension: {Blank single:19197::"chronic","months","years"} ?BP monitoring frequency: {Blank single:19197::"not checking","rarely","daily","weekly","monthly","a few times a day","a few times a week","a few times a month"} ?BP range:  ?BP medication side effects: {Blank single:19197::"yes","no"} ?Past BP meds: {Blank multiple:19196::"none","amlodipine","amlodipine/benazepril","atenolol","benazepril","benazepril/HCTZ","bisoprolol (bystolic)","carvedilol","chlorthalidone","clonidine","diltiazem","exforge HCT","HCTZ","irbesartan (avapro)","labetalol","lisinopril","lisinopril-HCTZ","losartan (cozaar)","methyldopa","nifedipine","olmesartan (benicar)","olmesartan-HCTZ","quinapril","ramipril","spironalactone","tekturna","valsartan","valsartan-HCTZ","verapamil"} ?Duration of hyperlipidemia: {Blank single:19197::"chronic","months","years"} ?Cholesterol medication side effects: {Blank single:19197::"yes","no"} ?Cholesterol supplements: {Blank multiple:19196::"none","fish oil","niacin","red yeast rice"} ?Past cholesterol medications: {Blank multiple:19196::"none","atorvastain (lipitor)","lovastatin (mevacor)","pravastatin (pravachol)","rosuvastatin (crestor)","simvastatin (zocor)","vytorin","fenofibrate (tricor)","gemfibrozil","ezetimide (zetia)","niaspan","lovaza"} ?Medication compliance: {Blank single:19197::"excellent compliance","good compliance","fair compliance","poor compliance"} ?Aspirin: {Blank single:19197::"yes","no"} ?Recent stressors: {Blank single:19197::"yes","no"} ?Recurrent headaches: {Blank single:19197::"yes","no"} ?Visual changes: {Blank single:19197::"yes","no"} ?Palpitations:  {Blank single:19197::"yes","no"} ?Dyspnea: {Blank single:19197::"yes","no"} ?Chest pain: {Blank single:19197::"yes","no"} ?Lower extremity edema: {Blank single:19197::"yes","no"} ?Dizzy/lightheaded: {Blank single:19197::"yes","no"} ? ?DIABETES ?Hypoglycemic episodes:{Blank single:19197::"yes","no"} ?Polydipsia/polyuria: {Blank single:19197::"yes","no"} ?Visual disturbance: {Blank single:19197::"yes","no"} ?Chest pain: {Blank single:19197::"yes","no"} ?Paresthesias: {Blank single:19197::"yes","no"} ?Glucose Monitoring: {Blank single:19197::"yes","no"} ? Accucheck frequency: {Blank single:19197::"Not Checking","Daily","BID","TID"} ? Fasting glucose: ? Post prandial: ? Evening: ? Before meals: ?Taking Insulin?: {Blank single:19197::"yes","no"} ? Long acting insulin: ? Short acting insulin: ?Blood Pressure Monitoring: {Blank single:19197::"not checking","rarely","daily","weekly","monthly","a few times a day","a few times a week","a few times a month"} ?Retinal Examination: {Blank single:19197::"Up to Date","Not up to Date"} ?Foot Exam: {Blank single:19197::"Up to Date","Not up to Date"} ?Diabetic Education: {Blank single:19197::"Completed","Not Completed"} ?Pneumovax: {Blank single:19197::"Up to Date","Not up to Date","unknown"} ?Influenza: {Blank single:19197::"Up to Date","Not up to Date","unknown"} ?Aspirin: {Blank single:19197::"yes","no"} ? ?Relevant past medical, surgical, family and social history reviewed and updated as indicated. Interim medical history since our last visit reviewed. ?Allergies and medications reviewed and updated. ? ?Review of Systems ? ?Per HPI unless specifically indicated above ? ?   ?Objective:  ?  ?There were no vitals taken for this visit.  ?Wt Readings from Last 3 Encounters:  ?10/13/21 245 lb (111.1 kg)  ?10/04/21 253 lb 4 oz (114.9 kg)  ?10/03/21 254 lb (115.2 kg)  ?  ?Physical Exam ? ?Results for orders placed or performed during the hospital encounter of 10/13/21  ?CBC  ?Result  Value Ref Range  ? WBC 9.2 4.0 - 10.5 K/uL  ? RBC 5.46 4.22 - 5.81 MIL/uL  ? Hemoglobin 17.1 (H) 13.0 - 17.0 g/dL  ? HCT 49.9 39.0 - 52.0 %  ? MCV 91.4 80.0 - 100.0 fL  ? MCH 31.3 26.0 - 34.0 pg  ? MCHC 34.3 30.0 - 36.0 g/dL  ? RDW 13.2 11.5 - 15.5 %  ? Platelets 281 150 - 400 K/uL  ? nRBC 0.0 0.0 - 0.2 %  ?Basic metabolic panel  ?Result Value Ref Range  ? Sodium 139 135 - 145 mmol/L  ? Potassium 3.9 3.5 - 5.1 mmol/L  ? Chloride 105 98 - 111 mmol/L  ? CO2 26 22 - 32 mmol/L  ? Glucose, Bld 209 (H) 70 - 99 mg/dL  ? BUN 15 6 - 20 mg/dL  ? Creatinine, Ser 0.81 0.61 - 1.24 mg/dL  ? Calcium 8.7 (L) 8.9 - 10.3 mg/dL  ? GFR, Estimated >60 >60 mL/min  ? Anion gap 8 5 - 15  ?Troponin I (High Sensitivity)  ?Result Value Ref Range  ? Troponin I (High Sensitivity) 12 <18 ng/L  ? ?   ?Assessment & Plan:  ? ?Problem List Items Addressed This Visit   ?  None ?  ? ?Follow up plan: ?No follow-ups on file. ? ? ? ? ? ?

## 2021-10-26 ENCOUNTER — Telehealth: Payer: Self-pay | Admitting: Family

## 2021-10-26 ENCOUNTER — Telehealth: Payer: Self-pay

## 2021-10-26 NOTE — Telephone Encounter (Signed)
Attempted to return patient's voicemail from earlier this morning, where he voiced concerns that his "breathing feels strange" and stated that he has been out of his jardiance medication for several days. Left voicemail for patient. Will continue to attempt to reach patient today. ?Georg Ruddle, RN ?

## 2021-10-26 NOTE — Telephone Encounter (Signed)
Returned patients call after he left voicemail stating he was SOB and been out of medication Jardiance for 4 days as he is waiting for it to be shipped to him. Added patient to the schedule for 4/13 to evaluate symptoms and to give him samples of Jardiance. I also called [employer] to get an update on application for Entresto and they are updating application as we recently sent in proof of income and we should be notified of an update soon. ? ? ?Suzana Sohail, NT ?

## 2021-10-26 NOTE — Telephone Encounter (Signed)
Spoke with Jardiance regarding patients application. They stated he is due for another shipment but has not called to request a refill and his application address is different then the address on chart. They are attempting to call patient to verify address and get a shipment out of his Jardiance. ? ? ?Berthe Oley, NT ?

## 2021-10-27 ENCOUNTER — Ambulatory Visit: Payer: 59 | Attending: Family | Admitting: Family

## 2021-10-27 ENCOUNTER — Encounter: Payer: Self-pay | Admitting: Family

## 2021-10-27 VITALS — BP 151/118 | HR 95 | Resp 20 | Ht 73.0 in | Wt 257.0 lb

## 2021-10-27 DIAGNOSIS — M25572 Pain in left ankle and joints of left foot: Secondary | ICD-10-CM | POA: Insufficient documentation

## 2021-10-27 DIAGNOSIS — I10 Essential (primary) hypertension: Secondary | ICD-10-CM | POA: Diagnosis not present

## 2021-10-27 DIAGNOSIS — G4733 Obstructive sleep apnea (adult) (pediatric): Secondary | ICD-10-CM | POA: Diagnosis not present

## 2021-10-27 DIAGNOSIS — I5022 Chronic systolic (congestive) heart failure: Secondary | ICD-10-CM

## 2021-10-27 DIAGNOSIS — M25571 Pain in right ankle and joints of right foot: Secondary | ICD-10-CM | POA: Diagnosis not present

## 2021-10-27 DIAGNOSIS — Z8249 Family history of ischemic heart disease and other diseases of the circulatory system: Secondary | ICD-10-CM | POA: Insufficient documentation

## 2021-10-27 DIAGNOSIS — T504X6A Underdosing of drugs affecting uric acid metabolism, initial encounter: Secondary | ICD-10-CM | POA: Diagnosis not present

## 2021-10-27 DIAGNOSIS — M109 Gout, unspecified: Secondary | ICD-10-CM | POA: Diagnosis not present

## 2021-10-27 DIAGNOSIS — Z79899 Other long term (current) drug therapy: Secondary | ICD-10-CM | POA: Diagnosis not present

## 2021-10-27 DIAGNOSIS — F17210 Nicotine dependence, cigarettes, uncomplicated: Secondary | ICD-10-CM | POA: Insufficient documentation

## 2021-10-27 DIAGNOSIS — Z91141 Patient's other noncompliance with medication regimen due to financial hardship: Secondary | ICD-10-CM | POA: Insufficient documentation

## 2021-10-27 DIAGNOSIS — Z7984 Long term (current) use of oral hypoglycemic drugs: Secondary | ICD-10-CM | POA: Insufficient documentation

## 2021-10-27 DIAGNOSIS — E119 Type 2 diabetes mellitus without complications: Secondary | ICD-10-CM | POA: Diagnosis not present

## 2021-10-27 DIAGNOSIS — I110 Hypertensive heart disease with heart failure: Secondary | ICD-10-CM | POA: Diagnosis not present

## 2021-10-27 NOTE — Progress Notes (Signed)
? Patient ID: Kirk Lyons, male    DOB: Oct 08, 1970, 51 y.o.   MRN: [id number] ? ?Kirk Lyons is a 51 y/o male with a history of HTN, gout, tobacco use and chronic heart failure.  ? ?Echo 07/20/21: Left Ventricle: Left ventricular ejection fraction, by estimation, is 45 to 50%. The left ventricle has mildly decreased function. The left ventricle demonstrates global hypokinesis. The left ventricular internal cavity size was normal in size. There is  ?mild left ventricular hypertrophy. Left ventricular diastolic parameters are consistent with Grade II diastolic dysfunction (pseudonormalization). Right Ventricle: The right ventricular size is normal. Moderately increased right ventricular wall thickness.  ?Left Atrium: Left atrial size was mildly dilated. Right Atrium: Right atrial size was mildly dilated ? ?Was in the ED 10/13/21 due to near syncope. Evaluated and released. Was in the ED 09/13/21 due to acute gout where he was treated and released. Was in the ED 06/09/21 due to shortness of breath and cough after being out of medications for ~ 1 week. Elevated troponin thought to be due to demand ischemia. Given IV lasix and admission recommended but patient declined. Symptoms improved and he was released.  ? ?He presents today for a follow-up visit with a chief complaint of moderate shortness of breath with minimal exertion. Describes this as chronic but has worsened over the last several days since he's been out of jardiance. He has associated fatigue, decreased appetite, cough, palpitations & chronic difficulty sleeping. He denies any dizziness, abdominal distention, pedal edema, chest pain or cough.  ? ?Waiting for jardiance to get mail ordered from patient assistance program and has been out of it completely for the last 5 days. Feels like his shortness of breath has worsened during this time.  ? ?Continues to have gout pain in his left ankle and right great toe. Admits to eating high protein foods. Has been unable  to afford the colchicine.  ? ?Past Medical History:  ?Diagnosis Date  ? CHF (congestive heart failure) ([place])   ? Essential hypertension 07/12/2021  ? Gout   ? Heart failure, type unknown ([place]) 07/12/2021  ? Hypertension   ? Obesity (BMI 30.0-34.9) 07/12/2021  ? OSA (obstructive sleep apnea) 07/12/2021  ? Tobacco use 07/12/2021  ? ?Past Surgical History:  ?Procedure Laterality Date  ? HERNIA REPAIR    ? ?Family History  ?Problem Relation Age of Onset  ? Breast cancer Mother   ? Hyperlipidemia Father   ? Coronary artery disease Father   ? Prostate cancer Father   ? Testicular cancer Brother   ? ?Social History  ? ?Tobacco Use  ? Smoking status: Former  ?  Types: Cigarettes  ? Smokeless tobacco: Never  ? Tobacco comments:  ?  May smoke a cigarette if he has a beer  ?Substance Use Topics  ? Alcohol use: Not Currently  ?  Comment: social  ? ?Allergies  ?Allergen Reactions  ? Erythromycin Nausea And Vomiting  ?  vomiting ?vomiting  ? ?Prior to Admission medications   ?Medication Sig Start Date End Date Taking? Authorizing Provider  ?albuterol (VENTOLIN HFA) 108 (90 Base) MCG/ACT inhaler Inhale 2 puffs into the lungs every 6 (six) hours as needed for wheezing or shortness of breath. 06/06/20  Yes Blake Divine, MD  ?carvedilol (COREG) 3.125 MG tablet Take 1 tablet (3.125 mg total) by mouth 2 (two) times daily. 07/06/21 10/27/21 Yes Alisa Graff, FNP  ?furosemide (LASIX) 40 MG tablet Take 1 tablet (40 mg total) by mouth daily. ?Patient  taking differently: Take 20 mg by mouth 2 (two) times daily. 10/03/21  Yes Ronney Asters, NP  ?metFORMIN (GLUCOPHAGE) 500 MG tablet Take 1 tablet (500 mg total) by mouth 2 (two) times daily with a meal. 08/03/21  Yes Chilton Si, MD  ?Multiple Vitamins-Minerals (EMERGEN-C VITAMIN C) PACK Take 1 Package by mouth as needed (immune system support).   Yes [provider]  ?rosuvastatin (CRESTOR) 20 MG tablet Take 1 tablet (20 mg total) by mouth daily. 08/03/21 11/01/21 Yes  Chilton Si, MD  ?sacubitril-valsartan (ENTRESTO) 24-26 MG Take 1 tablet by mouth 2 (two) times daily. 06/23/21  Yes Delma Freeze, FNP  ?colchicine 0.6 MG tablet Take 1 tablet (0.6 mg total) by mouth daily. ?Patient not taking: Reported on 10/27/2021 09/19/21   Larae Grooms, NP  ?empagliflozin (JARDIANCE) 10 MG TABS tablet Take 1 tablet (10 mg total) by mouth daily. ?Patient not taking: Reported on 10/27/2021 10/03/21   Ronney Asters, NP  ? ? ?Review of Systems  ?Constitutional:  Positive for appetite change (decreased) and fatigue.  ?HENT:  Negative for congestion, postnasal drip and sore throat.   ?Eyes: Negative.   ?Respiratory:  Positive for cough and shortness of breath. Negative for chest tightness.   ?Cardiovascular:  Positive for palpitations. Negative for chest pain and leg swelling.  ?Gastrointestinal:  Negative for abdominal distention and abdominal pain.  ?Endocrine: Negative.   ?Genitourinary: Negative.   ?Musculoskeletal:  Positive for arthralgias (left ankle/ right great toe). Negative for back pain and neck pain.  ?Skin: Negative.   ?Allergic/Immunologic: Negative.   ?Neurological:  Negative for dizziness and light-headedness.  ?Hematological:  Negative for adenopathy. Does not bruise/bleed easily.  ?Psychiatric/Behavioral:  Positive for sleep disturbance. Negative for decreased concentration and dysphoric mood. The patient is not nervous/anxious.   ? ?Vitals:  ? 10/27/21 0925  ?BP: (!) 151/118  ?Pulse: 95  ?Resp: 20  ?SpO2: 95%  ?Weight: 257 lb (116.6 kg)  ?Height: 6\' 1"  (1.854 m)  ? ?Wt Readings from Last 3 Encounters:  ?10/27/21 257 lb (116.6 kg)  ?10/13/21 245 lb (111.1 kg)  ?10/04/21 253 lb 4 oz (114.9 kg)  ? ?Lab Results  ?Component Value Date  ? CREATININE 0.81 10/13/2021  ? CREATININE 1.02 10/03/2021  ? CREATININE 1.22 08/22/2021  ? ? ?Physical Exam ?Vitals and nursing note reviewed.  ?Constitutional:   ?   General: He is not in acute distress. ?   Appearance: He is well-developed.   ?HENT:  ?   Head: Normocephalic and atraumatic.  ?Neck:  ?   Vascular: No JVD.  ?Cardiovascular:  ?   Rate and Rhythm: Normal rate and regular rhythm.  ?Pulmonary:  ?   Effort: Pulmonary effort is normal.  ?   Breath sounds: No wheezing, rhonchi or rales.  ?Abdominal:  ?   Palpations: Abdomen is soft.  ?   Tenderness: There is no abdominal tenderness.  ?Musculoskeletal:  ?   Cervical back: Normal range of motion and neck supple.  ?   Right lower leg: No edema.  ?   Left lower leg: Edema (around ankle) present.  ?Skin: ?   General: Skin is warm and dry.  ?Neurological:  ?   General: No focal deficit present.  ?   Mental Status: He is alert and oriented to person, place, and time.  ?Psychiatric:     ?   Mood and Affect: Mood is anxious.     ?   Behavior: Behavior normal.  ? ?Assessment &  Plan: ? 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- ?- NYHA class III ?- euvolemic today ?- weighing daily; reminded to call for an overnight weight gain of > 2 pounds or a weekly weight gain of >5 pounds ?- weight up 4 pounds from last visit here 3 weeks ago ?- not adding salt and tries to review food labels ?- saw cardiology Marilynn Rail) on 10/03/21 ?- on GDMT of jardiance, entresto and coreg although he's been out of jardiance for the last 5 days ?- 2 weeks samples provided and NT calling patient assistance for him ?- would like to add MRA but unsure if he can manage getting the frequent labs drawn ?- BNP 07/06/21 was 160.6 ?- PharmD reconciled medications with the patient ? ?2: HTN- ?- BP elevated (151/118) but he hasn't taken his medications yet today; says that he takes them at 11am and then again at 11pm ?- he was a NS for PCP appt that was scheduled for 10/17/21; this has been rescheduled for 11/23/21 ?- BMP 10/13/21 reviewed and showed sodium 139, potassium 3.9 creatinine 0.81 and GFR >60  ? ?3: Obstructive sleep apnea-  ?- says that he hasn't had a complete night sleep in ~ 20 years ?- has repeat sleep study  on 10/07/21; no results yet ?- he says that he wants his sleep apnea "fixed and not managed" and is interested in seeing ENT for possible surgery or sleep apnea device implanted ([employer]spire) ?- advised him th

## 2021-10-27 NOTE — Patient Instructions (Signed)
Continue weighing daily and call for an overnight weight gain of 3 pounds or more or a weekly weight gain of more than 5 pounds.   If you have voicemail, please make sure your mailbox is cleaned out so that we may leave a message and please make sure to listen to any voicemails.     

## 2021-11-15 ENCOUNTER — Telehealth: Payer: Self-pay | Admitting: Family

## 2021-11-15 NOTE — Telephone Encounter (Signed)
Patient called on advise as he was having severe back pain which is abnormal for him ands causing him to now breath well and concerned it may be his kidneys or something else. I advised patient he will have to follow up with his primary care or go to urgent care if he is majorly concerned as we specifically only deal with heart failure patients and heart failure symptoms and can not do anything to help with his back pain.  ?

## 2021-11-16 ENCOUNTER — Emergency Department: Payer: 59

## 2021-11-16 ENCOUNTER — Emergency Department
Admission: EM | Admit: 2021-11-16 | Discharge: 2021-11-16 | Disposition: A | Discharge status: Home / Self Care | Payer: 59 | Attending: Emergency Medicine | Admitting: Emergency Medicine

## 2021-11-16 DIAGNOSIS — R7309 Other abnormal glucose: Secondary | ICD-10-CM | POA: Diagnosis not present

## 2021-11-16 DIAGNOSIS — M545 Low back pain, unspecified: Secondary | ICD-10-CM | POA: Diagnosis present

## 2021-11-16 DIAGNOSIS — M5440 Lumbago with sciatica, unspecified side: Secondary | ICD-10-CM | POA: Diagnosis not present

## 2021-11-16 LAB — BASIC METABOLIC PANEL
Anion gap: 7 (ref 5–15)
BUN: 13 mg/dL (ref 6–20)
CO2: 24 mmol/L (ref 22–32)
Calcium: 8.6 mg/dL — ABNORMAL LOW (ref 8.9–10.3)
Chloride: 108 mmol/L (ref 98–111)
Creatinine, Ser: 1.13 mg/dL (ref 0.61–1.24)
GFR, Estimated: >60 mL/min (ref >60)
Glucose, Bld: 250 mg/dL — ABNORMAL HIGH (ref 70–99)
Potassium: 4.3 mmol/L (ref 3.5–5.1)
Sodium: 139 mmol/L (ref 135–145)

## 2021-11-16 LAB — URINALYSIS, ROUTINE W REFLEX MICROSCOPIC
Bilirubin Urine: NEGATIVE
Glucose, UA: >=500 mg/dL — AB
Hgb urine dipstick: NEGATIVE
Ketones, ur: NEGATIVE mg/dL
Leukocytes,Ua: NEGATIVE
Nitrite: NEGATIVE
Protein, ur: 100 mg/dL — AB
Specific Gravity, Urine: 1.026 (ref 1.005–1.030)
pH: 5 (ref 5.0–8.0)

## 2021-11-16 LAB — CBC
HCT: 52.6 % — ABNORMAL HIGH (ref 39.0–52.0)
Hemoglobin: 17.9 g/dL — ABNORMAL HIGH (ref 13.0–17.0)
MCH: 32.7 pg (ref 26.0–34.0)
MCHC: 34 g/dL (ref 30.0–36.0)
MCV: 96.2 fL (ref 80.0–100.0)
Platelets: 291 10*3/uL (ref 150–400)
RBC: 5.47 MIL/uL (ref 4.22–5.81)
RDW: 12.6 % (ref 11.5–15.5)
WBC: 9.3 10*3/uL (ref 4.0–10.5)
nRBC: 0 % (ref 0.0–0.2)

## 2021-11-16 MED ORDER — OXYCODONE-ACETAMINOPHEN 5-325 MG PO TABS: 1.0000 | ORAL_TABLET | ORAL | 0 refills | Status: DC | PRN

## 2021-11-16 MED ORDER — OXYCODONE-ACETAMINOPHEN 5-325 MG PO TABS
1.0000 | ORAL_TABLET | Freq: Once | ORAL | Status: AC
  Administered 2021-11-16: 1 via ORAL
  Filled 2021-11-16: qty 1

## 2021-11-16 MED ORDER — BACLOFEN 10 MG PO TABS
10.0000 mg | ORAL_TABLET | Freq: Three times a day (TID) | ORAL | 0 refills | Status: AC
Start: 2021-11-16 — End: 2021-11-23

## 2021-11-16 NOTE — ED Provider Notes (Signed)
? ?[facility] ?Provider Note ? ? ? Event Date/Time  ? First MD Initiated Contact with Patient 11/16/21 2015   ?  (approximate) ? ? ?History  ? ?Back Pain ? ? ?HPI ? ?Kirk Lyons is a 51 y.o. male presents emergency department complaint of right-sided back pain and concerns about his glucose.  States he restarted his Jardiance.  States symptoms when he forgets his metformin he does not feel well.  States he has not taken it yet today.  No vomiting, no diarrhea, no chest pain or shortness of breath. ? ?  ? ? ?Physical Exam  ? ?Triage Vital Signs: ?ED Triage Vitals  ?Enc Vitals Group  ?   BP 11/16/21 1707 (!) 144/102  ?   Pulse Rate 11/16/21 1707 (!) 121  ?   Resp 11/16/21 1707 18  ?   Temp 11/16/21 1707 98.5 ?F (36.9 ?C)  ?   Temp Source 11/16/21 1707 Oral  ?   SpO2 11/16/21 1707 93 %  ?   Weight 11/16/21 1707 253 lb (114.8 kg)  ?   Height 11/16/21 1707 6\' 1"  (1.854 m)  ?   Head Circumference --   ?   Peak Flow --   ?   Pain Score 11/16/21 1706 7  ?   Pain Loc --   ?   Pain Edu? --   ?   Excl. in GC? --   ? ? ?Most recent vital signs: ?Vitals:  ? 11/16/21 1707  ?BP: (!) 144/102  ?Pulse: (!) 121  ?Resp: 18  ?Temp: 98.5 ?F (36.9 ?C)  ?SpO2: 93%  ? ? ? ?General: Awake, no distress.   ?CV:  Good peripheral perfusion. regular rate and  rhythm ?Resp:  Normal effort.  ?Abd:  No distention.   ?Other:  SI joint on the right tender palpation ? ? ?ED Results / Procedures / Treatments  ? ?Labs ?(all labs ordered are listed, but only abnormal results are displayed) ?Labs Reviewed  ?CBC - Abnormal; Notable for the following components:  ?    Result Value  ? Hemoglobin 17.9 (*)   ? HCT 52.6 (*)   ? All other components within normal limits  ?BASIC METABOLIC PANEL - Abnormal; Notable for the following components:  ? Glucose, Bld 250 (*)   ? Calcium 8.6 (*)   ? All other components within normal limits  ?URINALYSIS, ROUTINE W REFLEX MICROSCOPIC - Abnormal; Notable for the following components:  ? Color,  Urine YELLOW (*)   ? APPearance HAZY (*)   ? Glucose, UA >=500 (*)   ? Protein, ur 100 (*)   ? Bacteria, UA RARE (*)   ? All other components within normal limits  ? ? ? ?EKG ? ? ? ? ?RADIOLOGY ?X-ray lumbar spine ? ? ? ?PROCEDURES: ? ? ?Procedures ? ? ?MEDICATIONS ORDERED IN ED: ?Medications  ?oxyCODONE-acetaminophen (PERCOCET/ROXICET) 5-325 MG per tablet 1 tablet (has no administration in time range)  ? ? ? ?IMPRESSION / MDM / ASSESSMENT AND PLAN / ED COURSE  ?I reviewed the triage vital signs and the nursing notes. ?             ?               ? ?Differential diagnosis includes, but is not limited to, sciatica, muscle strain, muscle spasm, abscess, hyperglycemia, DKA ? ?Feel DKA is not likely as patient has not had vomiting or other symptoms of DKA.  Also his anion gap  is normal. ? ?I do feel patient has sciatica.  X-ray of the lumbar spine was independently reviewed by me does not show any acute abnormality.  Only shows degenerative changes.  Confirmed by radiology ? ?Patient's glucose elevated 250, he is to take his metformin upon arrival at home.  Continue to use his Jardiance.  Drink plenty of water.  His blood cell counts are normal and his urinalysis is normal other than glucose. ? ?Patient is in agreement with treatment plan.  Was given Percocet here in the ED.  We given a prescription for Percocet as I do not want to place him on anti-inflammatories at this time causing kidney damage due to the elevated glucose.  He will use baclofen for his muscle relaxer.  Apply ice to the lower back.  Return emergency department worsening.  Check his sugars regularly.  He was discharged stable condition. ? ? ? ? ?  ? ? ?FINAL CLINICAL IMPRESSION(S) / ED DIAGNOSES  ? ?Final diagnoses:  ?Acute right-sided low back pain with sciatica, sciatica laterality unspecified  ?Elevated glucose  ? ? ? ?Rx / DC Orders  ? ?ED Discharge Orders   ? ?      Ordered  ?  oxyCODONE-acetaminophen (PERCOCET) 5-325 MG tablet  Every 4 hours  PRN       ? 11/16/21 2026  ?  baclofen (LIORESAL) 10 MG tablet  3 times daily       ? 11/16/21 2026  ? ?  ?  ? ?  ? ? ? ?Note:  This document was prepared using Dragon voice recognition software and may include unintentional dictation errors. ? ?  ?Faythe Ghee, PA-C ?11/16/21 2029 ? ?  ?Shaune Pollack, MD ?11/21/21 1507 ? ?

## 2021-11-16 NOTE — ED Triage Notes (Signed)
C/O right lower back pain x 3 days.  No injury that patient is aware of. States just restarted Jardiance, had run out for around 10 days while waiting for RX to arrive/ be delivered. ? ?Patient is AAOx3.  Skin warm and dry.  NAD.  Posture upright and relaxed.. Gait steady. ?

## 2021-11-16 NOTE — ED Triage Notes (Signed)
Right lower back pain. Denies any urinary symptoms. Has been back on his jardiance for a week and a half.  ?

## 2021-11-16 NOTE — ED Provider Triage Note (Signed)
Emergency Medicine Provider Triage Evaluation Note ? ?Santo Held , a 51 y.o. male  was evaluated in triage.  Pt complains of right lower back pain, also was off his Jardiance for a while and recently restarted.. ? ?Review of Systems  ?Positive: Back pain ?Negative: Fever chills ? ?Physical Exam  ?BP (!) 144/102   Pulse (!) 121   Temp 98.5 ?F (36.9 ?C) (Oral)   Resp 18   Ht 6\' 1"  (1.854 m)   Wt 114.8 kg   SpO2 93%   BMI 33.38 kg/m?  ?Gen:   Awake, no distress   ?Resp:  Normal effort  ?MSK:   Moves extremities without difficulty, lumbar spine and SI joint slightly tender to palpation, pain is reproduced with palpation and movement ?Other:   ? ?Medical Decision Making  ?Medically screening exam initiated at 5:10 PM.  Appropriate orders placed.  MENDY KLAPPER was informed that the remainder of the evaluation will be completed by another provider, this initial triage assessment does not replace that evaluation, and the importance of remaining in the ED until their evaluation is complete. ? ?Basic labs along with x-ray lumbar spine ordered ?  ?Versie Starks, PA-C ?11/16/21 1711 ? ?

## 2021-11-17 ENCOUNTER — Telehealth: Payer: Self-pay | Admitting: Family

## 2021-11-17 ENCOUNTER — Telehealth: Payer: Self-pay

## 2021-11-17 NOTE — Telephone Encounter (Signed)
Made in error

## 2021-11-17 NOTE — Telephone Encounter (Signed)
Notified patient that he was approved for [facility] through patient assitance and patient has already received his first 90 day supply. I advised patient when he is about a week or so from being out that he has to physically call [employer] and request a refill as they will not to it automatically. ? ? ?Kirk Lyons, NT ?

## 2021-11-22 NOTE — Progress Notes (Signed)
? ?BP 111/82   Pulse (!) 115   Temp 98.5 ?F (36.9 ?C) (Oral)   Wt 252 lb (114.3 kg)   SpO2 93%   BMI 33.25 kg/m?   ? ?Subjective:  ? ? Patient ID: Kirk Lyons, male    DOB: 05-Oct-1970, 51 y.o.   MRN: [id number] ? ?HPI: ?Kirk Lyons is a 51 y.o. male ? ?Chief Complaint  ?Patient presents with  ? Hypertension  ? Follow-up  ?  Patient would like to know his sleep study results, states he has not gotten them from June of last year .  ? ? ?HYPERTENSION / HYPERLIPIDEMIA ?Satisfied with current treatment? yes ?Duration of hypertension: years ?BP monitoring frequency: not checking ?BP range:  ?BP medication side effects: no ?Past BP meds: carvedilol, lasix , and entresto ?Duration of hyperlipidemia: years ?Cholesterol medication side effects: no ?Cholesterol supplements: none ?Past cholesterol medications: rosuvastatin (crestor) ?Medication compliance: excellent compliance ?Aspirin: no ?Recent stressors: no ?Recurrent headaches: no ?Visual changes: no ?Palpitations: no ?Dyspnea: no ?Chest pain: no ?Lower extremity edema: no ?Dizzy/lightheaded: no ? ?DIABETES ?Hypoglycemic episodes:has symptoms but not sure if it is sugar related ?Polydipsia/polyuria: no ?Visual disturbance: no ?Chest pain: no ?Paresthesias: no ?Glucose Monitoring: no ? Accucheck frequency: Not Checking ? Fasting glucose: ? Post prandial: ? Evening: ? Before meals: ?Taking Insulin?: no ? Long acting insulin: ? Short acting insulin: ?Blood Pressure Monitoring: not checking ?Retinal Examination: Not up to Date ?Foot Exam: Not up to Date ?Diabetic Education: Not Completed ?Pneumovax: Not up to Date ?Influenza: Not up to Date ?Aspirin: no ? ?Patient has a sleep study done last year ordered by [facility].  ? ? ?Review of Systems  ?Eyes:  Negative for visual disturbance.  ?Respiratory:  Negative for chest tightness and shortness of breath.   ?Cardiovascular:  Negative for chest pain, palpitations and leg swelling.  ?Endocrine: Negative for polydipsia  and polyuria.  ?Musculoskeletal:   ?     Left foot pain  ?Neurological:  Negative for dizziness, light-headedness, numbness and headaches.  ? ?Per HPI unless specifically indicated above ? ?   ?Objective:  ?  ?BP 111/82   Pulse (!) 115   Temp 98.5 ?F (36.9 ?C) (Oral)   Wt 252 lb (114.3 kg)   SpO2 93%   BMI 33.25 kg/m?   ?Wt Readings from Last 3 Encounters:  ?11/23/21 252 lb (114.3 kg)  ?11/16/21 253 lb (114.8 kg)  ?10/27/21 257 lb (116.6 kg)  ?  ?Physical Exam ?Vitals and nursing note reviewed.  ?Constitutional:   ?   General: He is not in acute distress. ?   Appearance: Normal appearance. He is obese. He is not ill-appearing, toxic-appearing or diaphoretic.  ?HENT:  ?   Head: Normocephalic.  ?   Right Ear: External ear normal.  ?   Left Ear: External ear normal.  ?   Nose: Nose normal. No congestion or rhinorrhea.  ?   Mouth/Throat:  ?   Mouth: Mucous membranes are moist.  ?Eyes:  ?   General:     ?   Right eye: No discharge.     ?   Left eye: No discharge.  ?   Extraocular Movements: Extraocular movements intact.  ?   Conjunctiva/sclera: Conjunctivae normal.  ?   Pupils: Pupils are equal, round, and reactive to light.  ?Cardiovascular:  ?   Rate and Rhythm: Normal rate and regular rhythm.  ?   Heart sounds: No murmur heard. ?Pulmonary:  ?   Effort:  Pulmonary effort is normal. No respiratory distress.  ?   Breath sounds: Normal breath sounds. No wheezing, rhonchi or rales.  ?Abdominal:  ?   General: Abdomen is flat. Bowel sounds are normal.  ?Musculoskeletal:  ?   Cervical back: Normal range of motion and neck supple.  ?Skin: ?   General: Skin is warm and dry.  ?   Capillary Refill: Capillary refill takes less than 2 seconds.  ?Neurological:  ?   General: No focal deficit present.  ?   Mental Status: He is alert and oriented to person, place, and time.  ?Psychiatric:     ?   Mood and Affect: Mood normal.     ?   Behavior: Behavior normal.     ?   Thought Content: Thought content normal.     ?   Judgment:  Judgment normal.  ? ? ?Results for orders placed or performed during the hospital encounter of 11/16/21  ?CBC  ?Result Value Ref Range  ? WBC 9.3 4.0 - 10.5 K/uL  ? RBC 5.47 4.22 - 5.81 MIL/uL  ? Hemoglobin 17.9 (H) 13.0 - 17.0 g/dL  ? HCT 52.6 (H) 39.0 - 52.0 %  ? MCV 96.2 80.0 - 100.0 fL  ? MCH 32.7 26.0 - 34.0 pg  ? MCHC 34.0 30.0 - 36.0 g/dL  ? RDW 12.6 11.5 - 15.5 %  ? Platelets 291 150 - 400 K/uL  ? nRBC 0.0 0.0 - 0.2 %  ?Basic metabolic panel  ?Result Value Ref Range  ? Sodium 139 135 - 145 mmol/L  ? Potassium 4.3 3.5 - 5.1 mmol/L  ? Chloride 108 98 - 111 mmol/L  ? CO2 24 22 - 32 mmol/L  ? Glucose, Bld 250 (H) 70 - 99 mg/dL  ? BUN 13 6 - 20 mg/dL  ? Creatinine, Ser 1.13 0.61 - 1.24 mg/dL  ? Calcium 8.6 (L) 8.9 - 10.3 mg/dL  ? GFR, Estimated >60 >60 mL/min  ? Anion gap 7 5 - 15  ?Urinalysis, Routine w reflex microscopic  ?Result Value Ref Range  ? Color, Urine YELLOW (A) YELLOW  ? APPearance HAZY (A) CLEAR  ? Specific Gravity, Urine 1.026 1.005 - 1.030  ? pH 5.0 5.0 - 8.0  ? Glucose, UA >=500 (A) NEGATIVE mg/dL  ? Hgb urine dipstick NEGATIVE NEGATIVE  ? Bilirubin Urine NEGATIVE NEGATIVE  ? Ketones, ur NEGATIVE NEGATIVE mg/dL  ? Protein, ur 100 (A) NEGATIVE mg/dL  ? Nitrite NEGATIVE NEGATIVE  ? Leukocytes,Ua NEGATIVE NEGATIVE  ? RBC / HPF 0-5 0 - 5 RBC/hpf  ? WBC, UA 0-5 0 - 5 WBC/hpf  ? Bacteria, UA RARE (A) NONE SEEN  ? Squamous Epithelial / LPF 0-5 0 - 5  ? Mucus PRESENT   ? Hyaline Casts, UA PRESENT   ? ?   ?Assessment & Plan:  ? ?Problem List Items Addressed This Visit   ? ?  ? Cardiovascular and Mediastinum  ? Heart failure, type unknown ([place])  ?  Chronic. Followed by HF clinic.  Continue to follow per their recommendations.  Continue with current medication regimen.  Will consider pneumonia shot at next visit. ?  - Reminded to call for an overnight weight gain of >2 pounds or a weekly weight gain of >5 pounds ?- not adding salt to food and read food labels. Reviewed the importance of keeping daily sodium  intake to <2075m daily. ?- Avoid Ibuprofen products. ? ?  ?  ? Essential hypertension  ?  Chronic.  Controlled.  Continue with current medication regimen of Carvedilol, lasix, and Entresto.  Labs ordered today.  Return to clinic in 3 months for reevaluation.  Call sooner if concerns arise.  ? ? ?  ?  ? Relevant Orders  ? HgB A1c  ?  ? Endocrine  ? Type 2 diabetes mellitus with other specified complication ([place]) - Primary  ?  Chronic. Sugars have been running in the 200s on other labs.  Currently on Metformin 569m BID and Jardiance. Does not want to do injection.  Can increase Metformin and possibly add Januvia if A1c is elevated.  Labs ordered today. Will make recommendations based on lab results.  ? ?  ?  ? Relevant Orders  ? Comp Met (CMET)  ?  ? Other  ? Hypercholesterolemia  ?  Chronic.  Controlled.  Continue with current medication regimen of Crestor 263mdaily.  Labs ordered today.  Return to clinic in 3 months for reevaluation.  Call sooner if concerns arise.   ? ? ?  ?  ? ?Other Visit Diagnoses   ? ? Elevated hemoglobin (H[facility])      ? Elevated to 17 on previous labs.  Will check CBC at visit today. Will make recommendations based on lab results.   ? Relevant Orders  ? CBC w/Diff  ? ?  ?  ? ?Follow up plan: ?Return in about 3 months (around 02/23/2022) for HTN, HLD, DM2 FU. ? ? ? ? ? ?

## 2021-11-23 ENCOUNTER — Ambulatory Visit (INDEPENDENT_AMBULATORY_CARE_PROVIDER_SITE_OTHER): Payer: 59 | Admitting: Nurse Practitioner

## 2021-11-23 ENCOUNTER — Encounter: Payer: Self-pay | Admitting: Nurse Practitioner

## 2021-11-23 VITALS — BP 111/82 | HR 115 | Temp 98.5°F | Wt 252.0 lb

## 2021-11-23 DIAGNOSIS — D582 Other hemoglobinopathies: Secondary | ICD-10-CM

## 2021-11-23 DIAGNOSIS — E1169 Type 2 diabetes mellitus with other specified complication: Secondary | ICD-10-CM | POA: Diagnosis not present

## 2021-11-23 DIAGNOSIS — I10 Essential (primary) hypertension: Secondary | ICD-10-CM

## 2021-11-23 DIAGNOSIS — I509 Heart failure, unspecified: Secondary | ICD-10-CM | POA: Diagnosis not present

## 2021-11-23 DIAGNOSIS — E7800 Pure hypercholesterolemia, unspecified: Secondary | ICD-10-CM

## 2021-11-23 NOTE — Assessment & Plan Note (Signed)
Chronic. Sugars have been running in the 200s on other labs.  Currently on Metformin 500mg  BID and Jardiance. Does not want to do injection.  Can increase Metformin and possibly add Januvia if A1c is elevated.  Labs ordered today. Will make recommendations based on lab results.  ?

## 2021-11-23 NOTE — Assessment & Plan Note (Signed)
Chronic.  Controlled.  Continue with current medication regimen of Crestor 20mg daily.  Labs ordered today.  Return to clinic in 3 months for reevaluation.  Call sooner if concerns arise.   

## 2021-11-23 NOTE — Assessment & Plan Note (Addendum)
Chronic.  Controlled.  Continue with current medication regimen of Carvedilol, lasix, and Entresto.  Labs ordered today.  Return to clinic in 3 months for reevaluation.  Call sooner if concerns arise.  ? ?

## 2021-11-23 NOTE — Assessment & Plan Note (Addendum)
Chronic. Followed by HF clinic.  Continue to follow per their recommendations.  Continue with current medication regimen.  Will consider pneumonia shot at next visit. ?  - Reminded to call for an overnight weight gain of >2 pounds or a weekly weight gain of >5 pounds ?- not adding salt to food and read food labels. Reviewed the importance of keeping daily sodium intake to 2000mg  daily. ?- Avoid Ibuprofen products. ? ?

## 2021-11-24 LAB — COMPREHENSIVE METABOLIC PANEL
ALT: 26 IU/L (ref 0–44)
AST: 17 IU/L (ref 0–40)
Albumin/Globulin Ratio: 1.8 (ref 1.2–2.2)
Albumin: 4.2 g/dL (ref 4.0–5.0)
Alkaline Phosphatase: 75 IU/L (ref 44–121)
BUN/Creatinine Ratio: 9 (ref 9–20)
BUN: 12 mg/dL (ref 6–24)
Bilirubin Total: 0.3 mg/dL (ref 0.0–1.2)
CO2: 22 mmol/L (ref 20–29)
Calcium: 9.2 mg/dL (ref 8.7–10.2)
Chloride: 102 mmol/L (ref 96–106)
Creatinine, Ser: 1.27 mg/dL (ref 0.76–1.27)
Globulin, Total: 2.4 g/dL (ref 1.5–4.5)
Glucose: 150 mg/dL — ABNORMAL HIGH (ref 70–99)
Potassium: 4.3 mmol/L (ref 3.5–5.2)
Sodium: 141 mmol/L (ref 134–144)
Total Protein: 6.6 g/dL (ref 6.0–8.5)
eGFR: 69 mL/min/{1.73_m2} (ref >59)

## 2021-11-24 LAB — HEMOGLOBIN A1C
Est. average glucose Bld gHb Est-mCnc: 154 mg/dL
Hgb A1c MFr Bld: 7 % — ABNORMAL HIGH (ref 4.8–5.6)

## 2021-11-24 NOTE — Progress Notes (Signed)
Hi Kirk Lyons. It was nice to see you yesterday.  Your lab work shows that your A1c improved to 7.0.  This is great news. Keep up the good work.  Your other lab work looks good.  No concerns at this time.

## 2021-11-25 NOTE — Progress Notes (Signed)
Hi Rayon! Your complete blood count is elevated.  This is something we need to continue to monitor at future visits but nothing that needs to be done now.  Please let me know if you have any questions.

## 2021-11-26 LAB — CBC WITH DIFFERENTIAL/PLATELET
Basophils Absolute: 0.2 10*3/uL (ref 0.0–0.2)
Basos: 2 %
EOS (ABSOLUTE): 0.2 10*3/uL (ref 0.0–0.4)
Eos: 2 %
Hematocrit: 53.6 % — ABNORMAL HIGH (ref 37.5–51.0)
Hemoglobin: 18.2 g/dL — ABNORMAL HIGH (ref 13.0–17.7)
Immature Grans (Abs): 0 10*3/uL (ref 0.0–0.1)
Immature Granulocytes: 0 %
Lymphocytes Absolute: 2.5 10*3/uL (ref 0.7–3.1)
Lymphs: 30 %
MCH: 33.5 pg — ABNORMAL HIGH (ref 26.6–33.0)
MCHC: 34 g/dL (ref 31.5–35.7)
MCV: 99 fL — ABNORMAL HIGH (ref 79–97)
Monocytes Absolute: 0.7 10*3/uL (ref 0.1–0.9)
Monocytes: 9 %
Neutrophils Absolute: 5 10*3/uL (ref 1.4–7.0)
Neutrophils: 57 %
Platelets: 298 10*3/uL (ref 150–450)
RBC: 5.44 x10E6/uL (ref 4.14–5.80)
RDW: 13 % (ref 11.6–15.4)
WBC: 8.6 10*3/uL (ref 3.4–10.8)

## 2021-11-26 LAB — SPECIMEN STATUS REPORT

## 2021-12-01 ENCOUNTER — Ambulatory Visit: Payer: 59 | Admitting: Family

## 2021-12-16 ENCOUNTER — Ambulatory Visit: Payer: Self-pay

## 2021-12-16 NOTE — Telephone Encounter (Signed)
The patient shares that they're continuing to experience back discomfort   The patient has experienced it for roughly 4-5 days   The patient has previously been prescribed baclofen (LIORESAL) 10 MG tablet [[id number]] and would like to continue taking the medication   Please contact further when possible    Chief Complaint: Right side low back pain. Seen in ED 11/16/21. Symptoms: Pain Frequency: 11/16/21 Pertinent Negatives: Patient denies weakness Disposition: [] ED /[] Urgent Care (no appt availability in office) / [] Appointment(In office/virtual)/ []  [facility]/ [] Home Care/ [] Refused Recommended Disposition /[] [facility]/ [x]  Follow-up with PCP Additional Notes: Pt. Asking for refill on Baclofen and Percocet that was given in ED. Please advise.  Answer Assessment - Initial Assessment Questions 1. ONSET: "When did the pain begin?"      5 days ago 2. LOCATION: "Where does it hurt?" (upper, mid or lower back)     Lower 3. SEVERITY: "How bad is the pain?"  (e.g., Scale 1-10; mild, moderate, or severe)   - MILD (1-3): doesn't interfere with normal activities    - MODERATE (4-7): interferes with normal activities or awakens from sleep    - SEVERE (8-10): excruciating pain, unable to do any normal activities      Now - 9 4. PATTERN: "Is the pain constant?" (e.g., yes, no; constant, intermittent)      Constant 5. RADIATION: "Does the pain shoot into your legs or elsewhere?"     No 6. CAUSE:  "What do you think is causing the back pain?"      Back pain 7. BACK OVERUSE:  "Any recent lifting of heavy objects, strenuous work or exercise?"     No 8. MEDICATIONS: "What have you taken so far for the pain?" (e.g., nothing, acetaminophen, NSAIDS)     Baclofen 9. NEUROLOGIC SYMPTOMS: "Do you have any weakness, numbness, or problems with bowel/bladder control?"     No 10. OTHER SYMPTOMS: "Do you have any other symptoms?" (e.g., fever, abdominal pain, burning with urination,  blood in urine)       No 11. PREGNANCY: "Is there any chance you are pregnant?" (e.g., yes, no; LMP)       N/a  Protocols used: Back Pain-A-AH

## 2021-12-16 NOTE — Telephone Encounter (Signed)
Called patient to offer appointment, patient states he moved to [place] and doesn't have a car. Patient is requesting medication refill from ED visit on 12/17/21, advised patient PCP requires appointment. Patient denied appointment and hung up.

## 2021-12-16 NOTE — Telephone Encounter (Signed)
Per Clydie Braun, patient needs an appointment.

## 2021-12-26 ENCOUNTER — Emergency Department (HOSPITAL_COMMUNITY)
Admission: EM | Admit: 2021-12-26 | Discharge: 2021-12-26 | Disposition: A | Discharge status: Home / Self Care | Payer: 59 | Attending: Emergency Medicine | Admitting: Emergency Medicine

## 2021-12-26 ENCOUNTER — Encounter (HOSPITAL_COMMUNITY): Payer: Self-pay

## 2021-12-26 ENCOUNTER — Other Ambulatory Visit: Payer: Self-pay

## 2021-12-26 DIAGNOSIS — Z79899 Other long term (current) drug therapy: Secondary | ICD-10-CM | POA: Diagnosis not present

## 2021-12-26 DIAGNOSIS — Z7984 Long term (current) use of oral hypoglycemic drugs: Secondary | ICD-10-CM | POA: Diagnosis not present

## 2021-12-26 DIAGNOSIS — I10 Essential (primary) hypertension: Secondary | ICD-10-CM | POA: Diagnosis not present

## 2021-12-26 DIAGNOSIS — E119 Type 2 diabetes mellitus without complications: Secondary | ICD-10-CM | POA: Diagnosis not present

## 2021-12-26 DIAGNOSIS — M545 Low back pain, unspecified: Secondary | ICD-10-CM | POA: Insufficient documentation

## 2021-12-26 MED ORDER — KETOROLAC TROMETHAMINE 60 MG/2ML IM SOLN
30.0000 mg | Freq: Once | INTRAMUSCULAR | Status: AC
  Administered 2021-12-26: 30 mg via INTRAMUSCULAR
  Filled 2021-12-26: qty 2

## 2021-12-26 MED ORDER — PREDNISONE 10 MG PO TABS: 40.0000 mg | ORAL_TABLET | Freq: Every day | ORAL | 0 refills | Status: DC

## 2021-12-26 MED ORDER — BACLOFEN 10 MG PO TABS
10.0000 mg | ORAL_TABLET | Freq: Three times a day (TID) | ORAL | 0 refills | Status: DC
Start: 2021-12-26 — End: 2022-03-15

## 2021-12-26 MED ORDER — BACLOFEN 10 MG PO TABS
10.0000 mg | ORAL_TABLET | Freq: Three times a day (TID) | ORAL | 0 refills | Status: DC
Start: 2021-12-26 — End: 2021-12-26

## 2021-12-26 NOTE — Discharge Instructions (Addendum)
You were seen in the ER for evaluation of your right sided lower back pain.  This seems to be SI joint pain.  I will prescribe you the baclofen as well as a short prednisone burst that should help you for your pain.  Please do not drive or operate heavy machinery while on baclofen as it can make you sleepy.  He can take Tylenol as needed.  If you have any concern, new or worsening symptoms, please return to the nearest emergency department for reevaluation.  Contact a health care provider if: You have pain that is not relieved with rest or medicine. You have increasing pain going down into your legs or buttocks. Your pain does not improve after 2 weeks. You have pain at night. You lose weight without trying. You have a fever or chills. You develop nausea or vomiting. You develop abdominal pain. Get help right away if: You develop new bowel or bladder control problems. You have unusual weakness or numbness in your arms or legs. You feel faint. These symptoms may represent a serious problem that is an emergency. Do not wait to see if the symptoms will go away. Get medical help right away. Call your local emergency services (911 in the [place] Do not drive yourself to the hospital.

## 2021-12-26 NOTE — ED Triage Notes (Signed)
Pt reports intermittent severe back pain to right lower back pain. Pt reports hx of problems with SI joint and believes this is related. Pt requesting baclofen.

## 2021-12-26 NOTE — ED Provider Notes (Signed)
[facility]-EMERGENCY DEPT Provider Note   CSN: [id number] Arrival date & time: 12/26/21  0944     History Chief Complaint  Patient presents with   Back Pain    Kirk Lyons is a 51 y.o. male with history of hypertension, diabetes, and sleep apnea presents the emergency department for evaluation of low right-sided since yesterday.  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  He was given opioid pain medication and baclofen in the past which is worked for him.  He reports this feels similar to the previous episode.  Denies any fecal or urine incontinence, numbness, tingling, saddle anesthesia, fever, or IV drug use.  Denies any trauma or falls.  Denies any weakness to his legs.   Back Pain Associated symptoms: no fever        Home Medications Prior to Admission medications   Medication Sig Start Date End Date Taking? Authorizing Provider  albuterol (VENTOLIN HFA) 108 (90 Base) MCG/ACT inhaler Inhale 2 puffs into the lungs every 6 (six) hours as needed for wheezing or shortness of breath. 06/06/20   Chesley Noon, MD  carvedilol (COREG) 3.125 MG tablet Take 1 tablet (3.125 mg total) by mouth 2 (two) times daily. 07/06/21 10/27/21  Delma Freeze, FNP  empagliflozin (JARDIANCE) 10 MG TABS tablet Take 1 tablet (10 mg total) by mouth daily. 10/03/21   Ronney Asters, NP  furosemide (LASIX) 40 MG tablet Take 1 tablet (40 mg total) by mouth daily. Patient taking differently: Take 20 mg by mouth 2 (two) times daily. 10/03/21   Ronney Asters, NP  metFORMIN (GLUCOPHAGE) 500 MG tablet Take 1 tablet (500 mg total) by mouth 2 (two) times daily with a meal. 08/03/21   Chilton Si, MD  Multiple Vitamins-Minerals (EMERGEN-C VITAMIN C) PACK Take 1 Package by mouth as needed (immune system support).    [provider]  oxyCODONE-acetaminophen  (PERCOCET) 5-325 MG tablet Take 1 tablet by mouth every 4 (four) hours as needed for severe pain. 11/16/21 11/16/22  Sherrie Mustache Roselyn Bering, PA-C  rosuvastatin (CRESTOR) 20 MG tablet Take 1 tablet (20 mg total) by mouth daily. 08/03/21 11/01/21  Chilton Si, MD  sacubitril-valsartan (ENTRESTO) 24-26 MG Take 1 tablet by mouth 2 (two) times daily. 06/23/21   Delma Freeze, FNP      Allergies    Erythromycin    Review of Systems   Review of Systems  Constitutional:  Negative for chills and fever.  Musculoskeletal:  Positive for back pain. Negative for neck pain.    Physical Exam Updated Vital Signs BP (!) 151/113   Pulse 94   Temp 98 F (36.7 C) (Oral)   Resp 18   SpO2 94%  Physical Exam Vitals and nursing note reviewed.  Constitutional:      General: He is not in acute distress.    Appearance: Normal appearance. He is not toxic-appearing.  Eyes:     General: No scleral icterus. Pulmonary:     Effort: Pulmonary effort is normal. No respiratory distress.  Musculoskeletal:        General: Tenderness present. No deformity.     Comments: Strength 5/5 the patient's lower bilateral extremities.  Palpable pulses.  Compartments soft.  Positive straight leg raise on the right.  He has tenderness over his mid to lateral right buttock, consistent with SI joint pain.  No  midline or paraspinal cervical, thoracic, or lumbar tenderness palpation.  No step-offs or deformities.  Skin:    General: Skin is dry.     Findings: No rash.  Neurological:     General: No focal deficit present.     Mental Status: He is alert. Mental status is at baseline.  Psychiatric:        Mood and Affect: Mood normal.     ED Results / Procedures / Treatments   Labs (all labs ordered are listed, but only abnormal results are displayed) Labs Reviewed - No data to display  EKG None  Radiology No results found.  Procedures Procedures   Medications Ordered in ED Medications  ketorolac (TORADOL) injection 30  mg (has no administration in time range)    ED Course/ Medical Decision Making/ A&P                           Medical Decision Making  51 year old male presents the emergency room for evaluation of right lower back pain.  Differential diagnosis includes but not limited to cauda equina, lumbar strain, sprain, fracture, chronic back pain, sciatica, epidural abscess.  Vital signs show elevated blood pressure 151/113, afebrile, pulse rate, satting well on room air without increased work of breathing.  Physical exam is pertinent for well-appearing patient.  There is no midline or paraspinal cervical, lumbar, or thoracic tenderness palpation.  No step-offs or deformities.  No overlying warmth or erythema.  He has a positive straight leg raise on the right.  Some tenderness over the mid lateral right buttock consistent with SI joint pain.  Palpable DP and PT pulses bilaterally.  Compartments are soft.  Sensation intact throughout.  Ordered Toradol for pain.  I do not see any imaging needed given this is atraumatic without any falls or traumas to the area.  Less likely fracture.  The patient reports this feels like his typical SI joint pain.  He is requesting baclofen as he has had this in the past and it works for him.  I think this is reasonable.  I doubt any fracture given that this is atraumatic without any falls.  Does not sound like sciatica as it does not radiate down his leg although could be some component or early sciatica.  Doubt any epidural abscess patient does not have any history of IV drug use and vital signs are stable.  Not cauda equina as patient does not have any saddle anesthesia, weakness, or any incontinence.  I did not notice any focal deficit or any neurological deficit.  No foot drop.  He has equal strength in his lower extremities.  We will send the patient home on a prednisone burst as well as a few baclofen pills.  Recommended he follow-up with his primary care doctor for this  pain.  We discussed strict return precautions red flag symptoms.  Patient verbalized understanding agrees to plan.  Patient stable to be discharged home in good condition.   Final Clinical Impression(s) / ED Diagnoses Final diagnoses:  Acute right-sided low back pain without sciatica    Rx / DC Orders ED Discharge Orders     None         Achille Rich, PA-C 12/26/21 1418    Lorre Nick, MD 12/27/21 1606

## 2021-12-29 ENCOUNTER — Ambulatory Visit: Payer: 59 | Attending: Family | Admitting: Family

## 2021-12-29 ENCOUNTER — Encounter: Payer: Self-pay | Admitting: Family

## 2021-12-29 ENCOUNTER — Ambulatory Visit: Payer: 59 | Admitting: Family

## 2021-12-29 VITALS — BP 142/98 | HR 105 | Resp 18 | Ht 73.0 in | Wt 248.4 lb

## 2021-12-29 DIAGNOSIS — M549 Dorsalgia, unspecified: Secondary | ICD-10-CM | POA: Insufficient documentation

## 2021-12-29 DIAGNOSIS — Z7901 Long term (current) use of anticoagulants: Secondary | ICD-10-CM | POA: Insufficient documentation

## 2021-12-29 DIAGNOSIS — I10 Essential (primary) hypertension: Secondary | ICD-10-CM

## 2021-12-29 DIAGNOSIS — R5383 Other fatigue: Secondary | ICD-10-CM | POA: Insufficient documentation

## 2021-12-29 DIAGNOSIS — G4733 Obstructive sleep apnea (adult) (pediatric): Secondary | ICD-10-CM | POA: Insufficient documentation

## 2021-12-29 DIAGNOSIS — Z79899 Other long term (current) drug therapy: Secondary | ICD-10-CM | POA: Insufficient documentation

## 2021-12-29 DIAGNOSIS — G8929 Other chronic pain: Secondary | ICD-10-CM | POA: Insufficient documentation

## 2021-12-29 DIAGNOSIS — I110 Hypertensive heart disease with heart failure: Secondary | ICD-10-CM | POA: Diagnosis not present

## 2021-12-29 DIAGNOSIS — I5022 Chronic systolic (congestive) heart failure: Secondary | ICD-10-CM

## 2021-12-29 DIAGNOSIS — I509 Heart failure, unspecified: Secondary | ICD-10-CM | POA: Insufficient documentation

## 2021-12-29 DIAGNOSIS — R059 Cough, unspecified: Secondary | ICD-10-CM | POA: Diagnosis not present

## 2021-12-29 DIAGNOSIS — Z7984 Long term (current) use of oral hypoglycemic drugs: Secondary | ICD-10-CM | POA: Diagnosis not present

## 2021-12-29 DIAGNOSIS — R0602 Shortness of breath: Secondary | ICD-10-CM | POA: Diagnosis not present

## 2021-12-29 DIAGNOSIS — E119 Type 2 diabetes mellitus without complications: Secondary | ICD-10-CM | POA: Insufficient documentation

## 2021-12-29 DIAGNOSIS — F17210 Nicotine dependence, cigarettes, uncomplicated: Secondary | ICD-10-CM | POA: Diagnosis not present

## 2021-12-29 LAB — GLUCOSE, CAPILLARY: Glucose-Capillary: 154 mg/dL — ABNORMAL HIGH (ref 70–99)

## 2021-12-29 NOTE — Progress Notes (Signed)
[facility] - HEART FAILURE CLINIC - PHARMACIST COUNSELING NOTE  Guideline-Directed Medical Therapy/Evidence Based Medicine  ACE/ARB/ARNI: Sacubitril-valsartan 24-26 mg twice daily Beta Blocker: Carvedilol 3.125 mg twice daily Aldosterone Antagonist: None Diuretic: Furosemide 40 mg daily SGLT2i: Empagliflozin 10 mg daily  Adherence Assessment  Do you ever forget to take your medication? [] Yes [x] No  Do you ever skip doses due to side effects? [] Yes [x] No  Do you have trouble affording your medicines? [] Yes [x] No  Are you ever unable to pick up your medication due to transportation difficulties? [] Yes [x] No  Do you ever stop taking your medications because you don't believe they are helping? [] Yes [x] No  Do you check your weight daily? [] Yes [x] No   Adherence strategy: keeps medications together  Barriers to obtaining medications: none  Vital signs: HR 105, BP 142/98, weight (pounds) 248 lb ECHO: Date 07/20/21, EF 45-50%, notes: mild left ventricular hypertrophy    Latest Ref Rng & Units 11/23/2021   10:41 AM 11/16/2021    5:08 PM 10/13/2021   12:11 AM  BMP  Glucose 70 - 99 mg/dL     BUN 6 - 24 mg/dL 12  13  15    Creatinine 0.76 - 1.27 mg/dL     BUN/Creat Ratio 9 - 20 9     Sodium 134 - 144 mmol/L 141  139  139   Potassium 3.5 - 5.2 mmol/L 4.3  4.3  3.9   Chloride 96 - 106 mmol/L 102  108  105   CO2 20 - 29 mmol/L 22  24  26    Calcium 8.7 - 10.2 mg/dL 9.2  8.6  8.7     Past Medical History:  Diagnosis Date   CHF (congestive heart failure) (HCC)    Essential hypertension 07/12/2021   Gout    Heart failure, type unknown (HCC) 07/12/2021   Hypertension    Obesity (BMI 30.0-34.9) 07/12/2021   OSA (obstructive sleep apnea) 07/12/2021   Tobacco use 07/12/2021    ASSESSMENT 51 year old male who presents to the HF clinic for follow up. PMH relevant to HTN, gout, tobacco use. Recently visited ED due to back pain. Has not  started prednisone, but plans to pick up from pharmacy today. Does not check blood pressure at home.  BMP 11/23/21: Scr 1.27, K 4.3  PLAN Reconciled medications Blood pressure elevated above goal. Could consider spironolactone for further GDMT optimization. Would require follow up labs to monitor Scr and K.  Expect increased blood pressure after starting prednisone for short course Educated on home blood pressure monitoring. Patient plans to look into cost and plan finances for this purchase.  Time spent: 15 minutes  099, PharmD Pharmacy Resident  12/29/2021 3:39 PM   Current Outpatient Medications:    albuterol (VENTOLIN HFA) 108 (90 Base) MCG/ACT inhaler, Inhale 2 puffs into the lungs every 6 (six) hours as needed for wheezing or shortness of breath., Disp: 8 g, Rfl: 0   baclofen (LIORESAL) 10 MG tablet, Take 1 tablet (10 mg total) by mouth 3 (three) times daily. (Patient taking differently: Take 5 mg by mouth 3 (three) times daily as needed for muscle spasms. Takes 1/2 tablet), Disp: 15 each, Rfl: 0   carvedilol (COREG) 3.125 MG tablet, Take 1 tablet (3.125 mg total) by mouth 2 (two) times daily., Disp: 60 tablet, Rfl: 3   empagliflozin (JARDIANCE) 10 MG TABS tablet, Take 1 tablet (10 mg total) by mouth daily., Disp: 90  tablet, Rfl: 3   furosemide (LASIX) 40 MG tablet, Take 1 tablet (40 mg total) by mouth daily. (Patient taking differently: Take 20 mg by mouth 2 (two) times daily. Taking 40 mg QAM), Disp: 90 tablet, Rfl: 3   metFORMIN (GLUCOPHAGE) 500 MG tablet, Take 1 tablet (500 mg total) by mouth 2 (two) times daily with a meal., Disp: 60 tablet, Rfl: 3   Multiple Vitamins-Minerals (EMERGEN-C VITAMIN C) PACK, Take 1 Package by mouth as needed (immune system support)., Disp: , Rfl:    oxyCODONE-acetaminophen (PERCOCET) 5-325 MG tablet, Take 1 tablet by mouth every 4 (four) hours as needed for severe pain., Disp: 10 tablet, Rfl: 0   predniSONE (DELTASONE) 10 MG tablet, Take  4 tablets (40 mg total) by mouth daily., Disp: 20 tablet, Rfl: 0   rosuvastatin (CRESTOR) 20 MG tablet, Take 1 tablet (20 mg total) by mouth daily., Disp: 90 tablet, Rfl: 3   sacubitril-valsartan (ENTRESTO) 24-26 MG, Take 1 tablet by mouth 2 (two) times daily., Disp: 60 tablet, Rfl: 3   COUNSELING POINTS/CLINICAL PEARLS   DRUGS TO CAUTION IN HEART FAILURE  Drug or Class Mechanism  Analgesics NSAIDs COX-2 inhibitors Glucocorticoids  Sodium and water retention, increased systemic vascular resistance, decreased response to diuretics   Diabetes Medications Metformin Thiazolidinediones Rosiglitazone (Avandia) Pioglitazone (Actos) DPP4 Inhibitors Saxagliptin (Onglyza) Sitagliptin (Januvia)   Lactic acidosis Possible calcium channel blockade   Unknown  Antiarrhythmics Class I  Flecainide Disopyramide Class III Sotalol Other Dronedarone  Negative inotrope, proarrhythmic   Proarrhythmic, beta blockade  Negative inotrope  Antihypertensives Alpha Blockers Doxazosin Calcium Channel Blockers Diltiazem Verapamil Nifedipine Central Alpha Adrenergics Moxonidine Peripheral Vasodilators Minoxidil  Increases renin and aldosterone  Negative inotrope    Possible sympathetic withdrawal  Unknown  Anti-infective Itraconazole Amphotericin B  Negative inotrope Unknown  Hematologic Anagrelide Cilostazol   Possible inhibition of PD IV Inhibition of PD III causing arrhythmias  Neurologic/Psychiatric Stimulants Anti-Seizure Drugs Carbamazepine Pregabalin Antidepressants Tricyclics Citalopram Parkinsons Bromocriptine Pergolide Pramipexole Antipsychotics Clozapine Antimigraine Ergotamine Methysergide Appetite suppressants Bipolar Lithium  Peripheral alpha and beta agonist activity  Negative inotrope and chronotrope Calcium channel blockade  Negative inotrope, proarrhythmic Dose-dependent QT prolongation  Excessive serotonin activity/valvular  damage Excessive serotonin activity/valvular damage Unknown  IgE mediated hypersensitivy, calcium channel blockade  Excessive serotonin activity/valvular damage Excessive serotonin activity/valvular damage Valvular damage  Direct myofibrillar degeneration, adrenergic stimulation  Antimalarials Chloroquine Hydroxychloroquine Intracellular inhibition of lysosomal enzymes  Urologic Agents Alpha Blockers Doxazosin Prazosin Tamsulosin Terazosin  Increased renin and aldosterone  Adapted from Page Williemae Natter, et al. "Drugs That May Cause or Exacerbate Heart Failure: A Scientific Statement from the American Heart  Association." Circulation 2016; 134:e32-e69. DOI: 10.1161/CIR.0000000000000426   MEDICATION ADHERENCES TIPS AND STRATEGIES Taking medication as prescribed improves patient outcomes in heart failure (reduces hospitalizations, improves symptoms, increases survival) Side effects of medications can be managed by decreasing doses, switching agents, stopping drugs, or adding additional therapy. Please let someone in the Heart Failure Clinic know if you have having bothersome side effects so we can modify your regimen. Do not alter your medication regimen without talking to [place].  Medication reminders can help patients remember to take drugs on time. If you are missing or forgetting doses you can try linking behaviors, using pill boxes, or an electronic reminder like an alarm on your phone or an app. Some people can also get automated phone calls as medication reminders.

## 2021-12-29 NOTE — Patient Instructions (Signed)
Continue weighing daily and call for an overnight weight gain of 3 pounds or more or a weekly weight gain of more than 5 pounds  If you have voicemail, please make sure your mailbox is cleaned out so that we may leave a message and please make sure to listen to any voicemails.    If you receive a satisfaction survey regarding the Heart Failure Clinic, please take the time to fill it out. This way we can continue to provide excellent care and make any changes that need to be made.     

## 2021-12-29 NOTE — Progress Notes (Signed)
Patient ID: Kirk Lyons, male    DOB: 1971/05/24, 51 y.o.   MRN: [id number]  Kirk Lyons is a 51 y/o male with a history of HTN, gout, tobacco use and chronic heart failure.   Echo 07/20/21: Left Ventricle: Left ventricular ejection fraction, by estimation, is 45 to 50%. The left ventricle has mildly decreased function. The left ventricle demonstrates global hypokinesis. The left ventricular internal cavity size was normal in size. There is mild left ventricular hypertrophy. Left ventricular diastolic parameters are consistent with Grade II diastolic dysfunction (pseudonormalization). Right Ventricle: The right ventricular size is normal. Moderately increased right ventricular wall thickness. Left Atrium: Left atrial size was mildly dilated. Right Atrium: Right atrial size was mildly dilated  Was in the ED 12/26/21 due to right sided back pain. Evaluated and released. Was in the ED 11/16/21 due to right sided back pain. Evaluated and released. Was in the ED 10/13/21 due to near syncope. Evaluated and released. Was in the ED 09/13/21 due to acute gout where he was treated and released.   He presents today for a follow-up visit with a chief complaint of moderate fatigue with minimal exertion. Describes this as chronic in nature having been present for several years. He has associated cough, shortness of breath, palpitations, back pain and chronic difficulty sleeping. He denies any dizziness, abdominal distention, pedal edema, chest pain or weight gain.   Says that he hasn't heard anything about getting equipment to get CPAP set up. Says that when he wakes up in the mornings, he always feels "hungover" because he's been tossing and turning all night.   Just took his medications prior to leaving for his appointment today. Occasionally will miss his morning dose of medication on his off days from work because he's sleeping in so late.   Past Medical History:  Diagnosis Date   CHF (congestive heart failure)  (HCC)    Essential hypertension 07/12/2021   Gout    Heart failure, type unknown (HCC) 07/12/2021   Hypertension    Obesity (BMI 30.0-34.9) 07/12/2021   OSA (obstructive sleep apnea) 07/12/2021   Tobacco use 07/12/2021   Past Surgical History:  Procedure Laterality Date   HERNIA REPAIR     Family History  Problem Relation Age of Onset   Breast cancer Mother    Hyperlipidemia Father    Coronary artery disease Father    Prostate cancer Father    Testicular cancer Brother    Social History   Tobacco Use   Smoking status: Former    Types: Cigarettes   Smokeless tobacco: Never   Tobacco comments:    May smoke a cigarette if he has a beer  Substance Use Topics   Alcohol use: Not Currently    Comment: social   Allergies  Allergen Reactions   Erythromycin Nausea And Vomiting    vomiting vomiting   Prior to Admission medications   Medication Sig Start Date End Date Taking? Authorizing Provider  albuterol (VENTOLIN HFA) 108 (90 Base) MCG/ACT inhaler Inhale 2 puffs into the lungs every 6 (six) hours as needed for wheezing or shortness of breath. 06/06/20  Yes Chesley Noon, MD  baclofen (LIORESAL) 10 MG tablet Take 1 tablet (10 mg total) by mouth 3 (three) times daily. Patient taking differently: Take 5 mg by mouth 3 (three) times daily as needed for muscle spasms. Takes 1/2 tablet 12/26/21  Yes Achille Rich, PA-C  carvedilol (COREG) 3.125 MG tablet Take 1 tablet (3.125 mg total) by mouth 2 (  two) times daily. 07/06/21 12/29/21 Yes Meyah Corle, Otila Kluver A, FNP  empagliflozin (JARDIANCE) 10 MG TABS tablet Take 1 tablet (10 mg total) by mouth daily. 10/03/21  Yes Cleaver, Jossie Ng, NP  furosemide (LASIX) 40 MG tablet Take 1 tablet (40 mg total) by mouth daily. Patient taking differently: Take 20 mg by mouth 2 (two) times daily. Taking 40 mg QAM 10/03/21  Yes Cleaver, Jossie Ng, NP  metFORMIN (GLUCOPHAGE) 500 MG tablet Take 1 tablet (500 mg total) by mouth 2 (two) times daily with a meal.  08/03/21  Yes Skeet Latch, MD  Multiple Vitamins-Minerals (EMERGEN-C VITAMIN C) PACK Take 1 Package by mouth as needed (immune system support).   Yes [provider]  oxyCODONE-acetaminophen (PERCOCET) 5-325 MG tablet Take 1 tablet by mouth every 4 (four) hours as needed for severe pain. 11/16/21 11/16/22 Yes Fisher, Linden Dolin, PA-C  predniSONE (DELTASONE) 10 MG tablet Take 4 tablets (40 mg total) by mouth daily. 12/26/21  Yes Sherrell Puller, PA-C  rosuvastatin (CRESTOR) 20 MG tablet Take 1 tablet (20 mg total) by mouth daily. 08/03/21 12/29/21 Yes Skeet Latch, MD  sacubitril-valsartan (ENTRESTO) 24-26 MG Take 1 tablet by mouth 2 (two) times daily. 06/23/21  Yes Alisa Graff, FNP   Review of Systems  Constitutional:  Positive for appetite change (decreased) and fatigue.  HENT:  Negative for congestion, postnasal drip and sore throat.   Eyes: Negative.   Respiratory:  Positive for cough and shortness of breath (very little). Negative for chest tightness.   Cardiovascular:  Positive for palpitations. Negative for chest pain and leg swelling.  Gastrointestinal:  Negative for abdominal distention and abdominal pain.  Endocrine: Negative.   Genitourinary: Negative.   Musculoskeletal:  Positive for back pain. Negative for arthralgias and neck pain.  Skin: Negative.   Allergic/Immunologic: Negative.   Neurological:  Negative for dizziness and light-headedness.  Hematological:  Negative for adenopathy. Does not bruise/bleed easily.  Psychiatric/Behavioral:  Positive for sleep disturbance. Negative for decreased concentration and dysphoric mood. The patient is not nervous/anxious.    Vitals:   12/29/21 1335 12/29/21 1417  BP: (!) 145/110 (!) 142/98  Pulse: (!) 105   Resp: 18   SpO2: 95%   Weight: 248 lb 6 oz (112.7 kg)   Height: 6\' 1"  (1.854 m)    Wt Readings from Last 3 Encounters:  12/29/21 248 lb 6 oz (112.7 kg)  11/23/21 252 lb (114.3 kg)  11/16/21 253 lb (114.8 kg)   Lab  Results  Component Value Date   CREATININE 1.27 11/23/2021   CREATININE 1.13 11/16/2021   CREATININE 0.81 10/13/2021   Physical Exam Vitals and nursing note reviewed.  Constitutional:      General: He is not in acute distress.    Appearance: He is well-developed.  HENT:     Head: Normocephalic and atraumatic.  Neck:     Vascular: No JVD.  Cardiovascular:     Rate and Rhythm: Normal rate and regular rhythm.  Pulmonary:     Effort: Pulmonary effort is normal.     Breath sounds: No wheezing, rhonchi or rales.  Abdominal:     Palpations: Abdomen is soft.     Tenderness: There is no abdominal tenderness.  Musculoskeletal:     Cervical back: Normal range of motion and neck supple.     Right lower leg: No edema.     Left lower leg: No edema.  Skin:    General: Skin is warm and dry.  Neurological:  General: No focal deficit present.     Mental Status: He is alert and oriented to person, place, and time.  Psychiatric:        Mood and Affect: Mood is anxious.        Behavior: Behavior normal.    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- - NYHA class III - euvolemic today - weighing daily; reminded to call for an overnight weight gain of > 2 pounds or a weekly weight gain of >5 pounds - weight down 9 pounds from last visit here 1 month ago - not adding salt and tries to review food labels - saw cardiology Molli Hazard) on 10/03/21 - on GDMT of jardiance, entresto and coreg  - would like to add MRA but unsure if he can manage getting the frequent labs drawn - BNP 07/06/21 was 160.6 - PharmD reconciled medications with the patient  2: HTN- - BP mildly elevated (142/98) but he just took his medications before coming to this appointment; will occasionally miss the morning dose if he oversleeps - saw PCP Caren Griffins) 11/23/21 - BMP 11/23/21 reviewed and showed sodium 141, potassium 4.3, creatinine 1.27 and GFR 69   3: Obstructive sleep  apnea-  - says that he hasn't had a complete night sleep in ~ 20 years - had repeat sleep study on 10/07/21 & this was reviewed - RX for CPAP with pressure settings faxed to [facility]  4: Diabetes mellitus- - A1C on 11/23/21 was 7.0% - nonfasting glucose here was 154   Patient did not bring his medications nor a list. Each medication was verbally reviewed with the patient and he was encouraged to bring the bottles to every visit to confirm accuracy of list.   Return in 3 months, sooner if needed.

## 2021-12-30 ENCOUNTER — Encounter: Payer: Self-pay | Admitting: Family

## 2022-01-04 ENCOUNTER — Other Ambulatory Visit: Payer: Self-pay | Admitting: Family

## 2022-01-04 MED ORDER — CARVEDILOL 3.125 MG PO TABS
3.1250 mg | ORAL_TABLET | Freq: Two times a day (BID) | ORAL | 5 refills | Status: DC
Start: 2022-01-04 — End: 2022-05-04

## 2022-01-05 ENCOUNTER — Telehealth: Payer: Self-pay | Admitting: Family

## 2022-01-05 NOTE — Telephone Encounter (Signed)
LVM with patient after patient called on 6/21 requesting his carvediolol to be send to different pharmacy and asking for a vitamin D perscription. Tina sent carvedilol and stated patient will need to get Vitamin D3 from primary care.   Arn Mcomber, NT

## 2022-02-06 ENCOUNTER — Ambulatory Visit (INDEPENDENT_AMBULATORY_CARE_PROVIDER_SITE_OTHER): Payer: 59 | Admitting: Family Medicine

## 2022-02-06 ENCOUNTER — Encounter (HOSPITAL_BASED_OUTPATIENT_CLINIC_OR_DEPARTMENT_OTHER): Payer: Self-pay | Admitting: Family Medicine

## 2022-02-06 DIAGNOSIS — E1169 Type 2 diabetes mellitus with other specified complication: Secondary | ICD-10-CM

## 2022-02-06 DIAGNOSIS — I10 Essential (primary) hypertension: Secondary | ICD-10-CM | POA: Diagnosis not present

## 2022-02-06 DIAGNOSIS — G4733 Obstructive sleep apnea (adult) (pediatric): Secondary | ICD-10-CM | POA: Diagnosis not present

## 2022-02-06 DIAGNOSIS — M1A9XX0 Chronic gout, unspecified, without tophus (tophi): Secondary | ICD-10-CM

## 2022-02-06 MED ORDER — ALBUTEROL SULFATE HFA 108 (90 BASE) MCG/ACT IN AERS
2.0000 | INHALATION_SPRAY | Freq: Four times a day (QID) | RESPIRATORY_TRACT | 0 refills | Status: DC | PRN
Start: 2022-02-06 — End: 2022-03-06

## 2022-02-06 MED ORDER — COLCHICINE 0.6 MG PO TABS: 0.6000 mg | ORAL_TABLET | Freq: Every day | ORAL | 2 refills | Status: DC

## 2022-02-06 MED ORDER — ALLOPURINOL 100 MG PO TABS: 100.0000 mg | ORAL_TABLET | Freq: Every day | ORAL | 6 refills | Status: DC

## 2022-02-06 NOTE — Assessment & Plan Note (Signed)
Discussed options with patient, will proceed with initiation of allopurinol.  Discussed potential risks and benefits.  Given severity and frequency of gout attacks, feel that urate lowering therapy would be beneficial for patient Discussed that target is a uric acid level less than 6.0.  We will begin with low-dose allopurinol at 100 mg.  We will have patient start with daily colchicine for prophylaxis Plan to recheck uric acid levels in 3 to 4 weeks and adjust dose of allopurinol as indicated

## 2022-02-06 NOTE — Progress Notes (Signed)
New Patient Office Visit  Subjective    Patient ID: Kirk Lyons, male    DOB: [birth year]  Age: 51 y.o. MRN: [id number]  CC:  Chief Complaint  Patient presents with   New Patient (Initial Visit)    Pt here to establish new care     HPI Kirk Lyons presents to establish care Last PCP - patient unsure  DM: Most recent hemoglobin A1c borderline at goal at 7.0%.  Current medications include metformin, Jardiance.  Does also have history of heart failure, this Jardiance has dual role.  Denies any current issues with polyuria or polydipsia.  Tolerating current medications well.  HTN: Does follow with cardiology regarding this.  Current medications include carvedilol, Entresto.  Denies any current issues with chest pain or headaches.  Does not check blood pressure regularly at home.  Chronic HF: Follows with heart failure clinic.  Current medications as above, as well as Lasix.  Indicates that provider at heart failure clinic also manages sleep apnea.  Does have CPAP at home, however thinks he needs additional pieces for the machine talked with [facility], however reportedly needs prescriptions for these components.  Gout: Was on urate lowering pharmacotherapy in the past medicates being told that he needed to stop the alpha by another provider, thinks it was while he was in the emergency department.  Primarily affected site is right great toe.  Generally uses colchicine as needed for gout attacks indicates that these occur about every 2 months.  Related to his gout attacks as well as his chronic low back pain, he will utilize baclofen and occasionally narcotic pain medication.  Patient is originally from [place], [place]. Patient works for [employer]. Outside of work, activities are mostly limited, will do some walking. Patient seems somewhat unsure regarding prior providers, particularly PCP.  Outpatient Encounter Medications as of 02/06/2022  Medication Sig   allopurinol (ZYLOPRIM) 100  MG tablet Take 1 tablet (100 mg total) by mouth daily.   baclofen (LIORESAL) 10 MG tablet Take 1 tablet (10 mg total) by mouth 3 (three) times daily. (Patient taking differently: Take 5 mg by mouth 3 (three) times daily as needed for muscle spasms. Takes 1/2 tablet)   carvedilol (COREG) 3.125 MG tablet Take 1 tablet (3.125 mg total) by mouth 2 (two) times daily.   colchicine 0.6 MG tablet Take 1 tablet (0.6 mg total) by mouth daily.   empagliflozin (JARDIANCE) 10 MG TABS tablet Take 1 tablet (10 mg total) by mouth daily.   furosemide (LASIX) 40 MG tablet Take 1 tablet (40 mg total) by mouth daily. (Patient taking differently: Take 20 mg by mouth 2 (two) times daily. Taking 40 mg QAM)   metFORMIN (GLUCOPHAGE) 500 MG tablet Take 1 tablet (500 mg total) by mouth 2 (two) times daily with a meal.   Multiple Vitamins-Minerals (EMERGEN-C VITAMIN C) PACK Take 1 Package by mouth as needed (immune system support).   oxyCODONE-acetaminophen (PERCOCET) 5-325 MG tablet Take 1 tablet by mouth every 4 (four) hours as needed for severe pain.   predniSONE (DELTASONE) 10 MG tablet Take 4 tablets (40 mg total) by mouth daily.   sacubitril-valsartan (ENTRESTO) 24-26 MG Take 1 tablet by mouth 2 (two) times daily.   [DISCONTINUED] albuterol (VENTOLIN HFA) 108 (90 Base) MCG/ACT inhaler Inhale 2 puffs into the lungs every 6 (six) hours as needed for wheezing or shortness of breath.   albuterol (VENTOLIN HFA) 108 (90 Base) MCG/ACT inhaler Inhale 2 puffs into the lungs every  6 (six) hours as needed for wheezing or shortness of breath.   rosuvastatin (CRESTOR) 20 MG tablet Take 1 tablet (20 mg total) by mouth daily.   [DISCONTINUED] carvedilol (COREG) 3.125 MG tablet Take 1 tablet (3.125 mg total) by mouth 2 (two) times daily.   No facility-administered encounter medications on file as of 02/06/2022.    Past Medical History:  Diagnosis Date   CHF (congestive heart failure) (HCC)    Essential hypertension 07/12/2021    Gout    Heart failure, type unknown (HCC) 07/12/2021   Hypertension    Obesity (BMI 30.0-34.9) 07/12/2021   OSA (obstructive sleep apnea) 07/12/2021   Tobacco use 07/12/2021    Past Surgical History:  Procedure Laterality Date   HERNIA REPAIR      Family History  Problem Relation Age of Onset   Breast cancer Mother    Hyperlipidemia Father    Coronary artery disease Father    Prostate cancer Father    Testicular cancer Brother     Social History   Socioeconomic History   Marital status: Single    Spouse name: Not on file   Number of children: Not on file   Years of education: Not on file   Highest education level: Not on file  Occupational History   Not on file  Tobacco Use   Smoking status: Former    Types: Cigarettes   Smokeless tobacco: Never   Tobacco comments:    May smoke a cigarette if he has a beer  Vaping Use   Vaping Use: Never used  Substance and Sexual Activity   Alcohol use: Not Currently    Comment: social   Drug use: No   Sexual activity: Not Currently  Other Topics Concern   Not on file  Social History Narrative   Not on file   Social Determinants of Health   Financial Resource Strain: High Risk (07/12/2021)   Overall Financial Resource Strain (CARDIA)    Difficulty of Paying Living Expenses: Very hard  Food Insecurity: No Food Insecurity (07/12/2021)   Hunger Vital Sign    Worried About Running Out of Food in the Last Year: Never true    Ran Out of Food in the Last Year: Never true  Transportation Needs: Unmet Transportation Needs (07/12/2021)   PRAPARE - Transportation    Lack of Transportation (Medical): No    Lack of Transportation (Non-Medical): Yes  Physical Activity: Unknown (07/12/2021)   Exercise Vital Sign    Days of Exercise per Week: 5 days    Minutes of Exercise per Session: Not on file  Stress: Stress Concern Present (07/12/2021)   [employer] of Occupational Health - Occupational Stress Questionnaire    Feeling  of Stress : Very much  Social Connections: Socially Isolated (07/12/2021)   Social Connection and Isolation Panel [NHANES]    Frequency of Communication with Friends and Family: More than three times a week    Frequency of Social Gatherings with Friends and Family: Once a week    Attends Religious Services: Never    Database administrator or Organizations: No    Attends Banker Meetings: Never    Marital Status: Never married  Intimate Partner Violence: Not At Risk (07/12/2021)   Humiliation, Afraid, Rape, and Kick questionnaire    Fear of Current or Ex-Partner: No    Emotionally Abused: No    Physically Abused: No    Sexually Abused: No    Objective    BP Marland Kitchen)  139/101   Pulse 93   Ht 6\' 1"  (1.854 m)   Wt 243 lb 8 oz (110.5 kg)   SpO2 100%   BMI 32.13 kg/m   Physical Exam  51 year old male in no acute distress Cardiovascular exam with regular rate and rhythm, no murmur Lungs clear to auscultation bilaterally  Assessment & Plan:   Problem List Items Addressed This Visit       Cardiovascular and Mediastinum   Essential hypertension    Blood pressure borderline in office today For now, will continue with current regimen Recommend intermittent monitoring at home, DASH diet Recommend regular follow-up with cardiology        Respiratory   OSA (obstructive sleep apnea)    Recommend reaching out to DME company or heart failure clinic if they are managing CPAP in order to ensure that he receives necessary components and settings for CPAP for nightly use Continue close follow-up with specialist who is managing CPAP        Endocrine   Type 2 diabetes mellitus with other specified complication (HCC)    Most recent hemoglobin A1c near target, continue with current medications of metformin and Jardiance Patient due for foot exam, will also need to check on status of pneumococcal vaccination Plan to recheck hemoglobin A1c near time of next appointment         Other   Gout    Discussed options with patient, will proceed with initiation of allopurinol.  Discussed potential risks and benefits.  Given severity and frequency of gout attacks, feel that urate lowering therapy would be beneficial for patient Discussed that target is a uric acid level less than 6.0.  We will begin with low-dose allopurinol at 100 mg.  We will have patient start with daily colchicine for prophylaxis Plan to recheck uric acid levels in 3 to 4 weeks and adjust dose of allopurinol as indicated      Relevant Medications   allopurinol (ZYLOPRIM) 100 MG tablet   colchicine 0.6 MG tablet   Other Relevant Orders   Uric acid    Return in about 3 months (around 05/09/2022) for Gout, DM.   Brentt Fread J De 05/11/2022, MD

## 2022-02-06 NOTE — Assessment & Plan Note (Signed)
Most recent hemoglobin A1c near target, continue with current medications of metformin and Jardiance Patient due for foot exam, will also need to check on status of pneumococcal vaccination Plan to recheck hemoglobin A1c near time of next appointment

## 2022-02-06 NOTE — Assessment & Plan Note (Signed)
Blood pressure borderline in office today For now, will continue with current regimen Recommend intermittent monitoring at home, DASH diet Recommend regular follow-up with cardiology

## 2022-02-06 NOTE — Patient Instructions (Signed)

## 2022-02-06 NOTE — Assessment & Plan Note (Signed)
Recommend reaching out to DME company or heart failure clinic if they are managing CPAP in order to ensure that he receives necessary components and settings for CPAP for nightly use Continue close follow-up with specialist who is managing CPAP

## 2022-02-13 ENCOUNTER — Encounter (HOSPITAL_BASED_OUTPATIENT_CLINIC_OR_DEPARTMENT_OTHER): Payer: Self-pay | Admitting: Family Medicine

## 2022-02-13 ENCOUNTER — Ambulatory Visit (INDEPENDENT_AMBULATORY_CARE_PROVIDER_SITE_OTHER): Payer: 59 | Admitting: Family Medicine

## 2022-02-13 DIAGNOSIS — L299 Pruritus, unspecified: Secondary | ICD-10-CM

## 2022-02-13 NOTE — Progress Notes (Unsigned)
   Virtual Visit via Telephone   I connected with  Kirk Lyons  on 02/14/22 by telephone/telehealth and verified that I am speaking with the correct person using two identifiers.   I discussed the limitations, risks, security and privacy concerns of performing an evaluation and management service by telephone, including the higher likelihood of inaccurate diagnosis and treatment, and the availability of in person appointments.  We also discussed the likely need of an additional face to face encounter for complete and high quality delivery of care.  I also discussed with the patient that there may be a patient responsible charge related to this service. The patient expressed understanding and wishes to proceed.  Provider location is in medical facility. Patient location is at their home, different from provider location. People involved in care of the patient during this telehealth encounter were myself, my nurse/medical assistant, and my front office/scheduling team member.  Review of Systems: No fevers, chills, night sweats, weight loss, chest pain, or shortness of breath.   Objective Findings:    General: Speaking full sentences, no audible heavy breathing.  Sounds alert and appropriately interactive.    Independent interpretation of tests performed by another provider:   None.  Brief History, Exam, Impression, and Recommendations:    Pruritus Patient reports he developed whole body itching last night.  Has not noticed a rash.  Denies any close contacts with similar symptoms.  No systemic symptoms such as fever, chills, sweats.  No abdominal pain or headaches.  At most recent office visit, patient was restarted on allopurinol for treatment of gout.  Discussed possibility of this being a source of itching as a possible reaction to the medication. At this time, will have patient discontinue allopurinol and monitor symptoms.  Plan for close follow-up in about 1 to 2 weeks to further  discuss and determine role for alternative urate lowering pharmacotherapy such as Uloric  I discussed the above assessment and treatment plan with the patient. The patient was provided an opportunity to ask questions and all were answered. The patient agreed with the plan and demonstrated an understanding of the instructions.   The patient was advised to call back or seek an in-person evaluation if the symptoms worsen or if the condition fails to improve as anticipated.   I provided 10 minutes of face to face and non-face-to-face time during this encounter date, time was needed to gather information, review chart, records, communicate/coordinate with staff remotely, as well as complete documentation.   ___________________________________________ Keiarah Orlowski de [place], MD, ABFM, CAQSM Primary Care and Sports Medicine [facility]

## 2022-02-14 NOTE — Assessment & Plan Note (Signed)
Patient reports he developed whole body itching last night.  Has not noticed a rash.  Denies any close contacts with similar symptoms.  No systemic symptoms such as fever, chills, sweats.  No abdominal pain or headaches.  At most recent office visit, patient was restarted on allopurinol for treatment of gout.  Discussed possibility of this being a source of itching as a possible reaction to the medication. At this time, will have patient discontinue allopurinol and monitor symptoms.  Plan for close follow-up in about 1 to 2 weeks to further discuss and determine role for alternative urate lowering pharmacotherapy such as Uloric

## 2022-02-23 ENCOUNTER — Ambulatory Visit: Payer: 59 | Admitting: Nurse Practitioner

## 2022-02-27 ENCOUNTER — Ambulatory Visit (HOSPITAL_BASED_OUTPATIENT_CLINIC_OR_DEPARTMENT_OTHER): Payer: 59

## 2022-02-27 ENCOUNTER — Ambulatory Visit (HOSPITAL_BASED_OUTPATIENT_CLINIC_OR_DEPARTMENT_OTHER): Payer: 59 | Admitting: Family Medicine

## 2022-03-05 ENCOUNTER — Other Ambulatory Visit (HOSPITAL_BASED_OUTPATIENT_CLINIC_OR_DEPARTMENT_OTHER): Payer: Self-pay | Admitting: Family Medicine

## 2022-03-06 ENCOUNTER — Ambulatory Visit (HOSPITAL_BASED_OUTPATIENT_CLINIC_OR_DEPARTMENT_OTHER): Payer: 59 | Admitting: Family Medicine

## 2022-03-15 ENCOUNTER — Encounter: Payer: Self-pay | Admitting: Family

## 2022-03-15 ENCOUNTER — Encounter: Payer: Self-pay | Admitting: Pharmacist

## 2022-03-15 ENCOUNTER — Ambulatory Visit (HOSPITAL_BASED_OUTPATIENT_CLINIC_OR_DEPARTMENT_OTHER): Payer: 59 | Admitting: Family

## 2022-03-15 ENCOUNTER — Other Ambulatory Visit
Admission: RE | Admit: 2022-03-15 | Discharge: 2022-03-15 | Disposition: A | Discharge status: Home / Self Care | Payer: 59 | Source: Ambulatory Visit | Attending: Family | Admitting: Family

## 2022-03-15 VITALS — BP 156/103 | HR 104 | Resp 20 | Ht 73.0 in | Wt 243.2 lb

## 2022-03-15 DIAGNOSIS — G4733 Obstructive sleep apnea (adult) (pediatric): Secondary | ICD-10-CM | POA: Insufficient documentation

## 2022-03-15 DIAGNOSIS — R0602 Shortness of breath: Secondary | ICD-10-CM | POA: Insufficient documentation

## 2022-03-15 DIAGNOSIS — I110 Hypertensive heart disease with heart failure: Secondary | ICD-10-CM | POA: Insufficient documentation

## 2022-03-15 DIAGNOSIS — I5022 Chronic systolic (congestive) heart failure: Secondary | ICD-10-CM | POA: Diagnosis present

## 2022-03-15 DIAGNOSIS — M109 Gout, unspecified: Secondary | ICD-10-CM | POA: Insufficient documentation

## 2022-03-15 DIAGNOSIS — I10 Essential (primary) hypertension: Secondary | ICD-10-CM

## 2022-03-15 DIAGNOSIS — I509 Heart failure, unspecified: Secondary | ICD-10-CM | POA: Insufficient documentation

## 2022-03-15 DIAGNOSIS — E119 Type 2 diabetes mellitus without complications: Secondary | ICD-10-CM | POA: Insufficient documentation

## 2022-03-15 DIAGNOSIS — F17210 Nicotine dependence, cigarettes, uncomplicated: Secondary | ICD-10-CM | POA: Insufficient documentation

## 2022-03-15 LAB — BASIC METABOLIC PANEL
Anion gap: 15 (ref 5–15)
BUN: 18 mg/dL (ref 6–20)
CO2: 24 mmol/L (ref 22–32)
Calcium: 9 mg/dL (ref 8.9–10.3)
Chloride: 102 mmol/L (ref 98–111)
Creatinine, Ser: 1.02 mg/dL (ref 0.61–1.24)
GFR, Estimated: >60 mL/min (ref >60)
Glucose, Bld: 117 mg/dL — ABNORMAL HIGH (ref 70–99)
Potassium: 4.3 mmol/L (ref 3.5–5.1)
Sodium: 141 mmol/L (ref 135–145)

## 2022-03-15 MED ORDER — FUROSEMIDE 40 MG PO TABS: 40.0000 mg | ORAL_TABLET | Freq: Every day | ORAL | 3 refills | Status: DC

## 2022-03-15 MED ORDER — ENTRESTO 49-51 MG PO TABS: 1.0000 | ORAL_TABLET | Freq: Two times a day (BID) | ORAL | 5 refills | Status: DC

## 2022-03-15 MED ORDER — ENTRESTO 49-51 MG PO TABS: 1.0000 | ORAL_TABLET | Freq: Two times a day (BID) | ORAL | 3 refills | Status: DC

## 2022-03-15 NOTE — Progress Notes (Signed)
Patient ID: Kirk Lyons, male    DOB: August 31, 1970, 51 y.o.   MRN: [id number]  Mr Habig is a 51 y/o male with a history of HTN, gout, tobacco use and chronic heart failure.   Echo 07/20/21: Left Ventricle: Left ventricular ejection fraction, by estimation, is 45 to 50%. The left ventricle has mildly decreased function. The left ventricle demonstrates global hypokinesis. The left ventricular internal cavity size was normal in size. There is mild left ventricular hypertrophy. Left ventricular diastolic parameters are consistent with Grade II diastolic dysfunction (pseudonormalization). Right Ventricle: The right ventricular size is normal. Moderately increased right ventricular wall thickness. Left Atrium: Left atrial size was mildly dilated. Right Atrium: Right atrial size was mildly dilated  Was in the ED 12/26/21 due to right sided back pain. Evaluated and released. Was in the ED 11/16/21 due to right sided back pain. Evaluated and released. Was in the ED 10/13/21 due to near syncope. Evaluated and released.   He presents today for a follow-up visit with a chief complaint of moderate fatigue upon minimal exertion. Describes this as chronic in nature. Has associated shortness of breath, palpitations, chronic back pain and difficulty sleeping along with this. He does feel like his shortness of breath has worsened over the last 4 days. Has not used his inhaler during this time. Denies any dizziness, abdominal distention, pedal edema, chest pain, wheezing, cough or weight gain.   Has not taken any of his medications yet today as he says that he doesn't like to take them early in the day  Past Medical History:  Diagnosis Date   CHF (congestive heart failure) (HCC)    Essential hypertension 07/12/2021   Gout    Heart failure, type unknown (HCC) 07/12/2021   Hypertension    Obesity (BMI 30.0-34.9) 07/12/2021   OSA (obstructive sleep apnea) 07/12/2021   Tobacco use 07/12/2021   Past Surgical History:   Procedure Laterality Date   HERNIA REPAIR     Family History  Problem Relation Age of Onset   Breast cancer Mother    Hyperlipidemia Father    Coronary artery disease Father    Prostate cancer Father    Testicular cancer Brother    Social History   Tobacco Use   Smoking status: Former    Types: Cigarettes   Smokeless tobacco: Never   Tobacco comments:    May smoke a cigarette if he has a beer  Substance Use Topics   Alcohol use: Not Currently    Comment: social   Allergies  Allergen Reactions   Erythromycin Nausea And Vomiting    vomiting vomiting   Prior to Admission medications   Medication Sig Start Date End Date Taking? Authorizing Provider  albuterol (VENTOLIN HFA) 108 (90 Base) MCG/ACT inhaler TAKE 2 PUFFS BY MOUTH EVERY 6 HOURS AS NEEDED FOR WHEEZE OR SHORTNESS OF BREATH 03/06/22  Yes de [place], Raymond J, MD  carvedilol (COREG) 3.125 MG tablet Take 1 tablet (3.125 mg total) by mouth 2 (two) times daily. 01/04/22  Yes Eliceo Gladu, Inetta Fermo A, FNP  empagliflozin (JARDIANCE) 10 MG TABS tablet Take 1 tablet (10 mg total) by mouth daily. 10/03/21  Yes Cleaver, Thomasene Ripple, NP  furosemide (LASIX) 40 MG tablet Take 1 tablet (40 mg total) by mouth daily. Patient taking differently: Take 20 mg by mouth 2 (two) times daily. Taking 40 mg QAM 10/03/21  Yes Cleaver, Thomasene Ripple, NP  metFORMIN (GLUCOPHAGE) 500 MG tablet Take 1 tablet (500 mg total) by mouth 2 (  two) times daily with a meal. 08/03/21  Yes Skeet Latch, MD  Multiple Vitamins-Minerals (EMERGEN-C VITAMIN C) PACK Take 1 Package by mouth as needed (immune system support).   Yes [provider]  rosuvastatin (CRESTOR) 20 MG tablet Take 1 tablet (20 mg total) by mouth daily. 08/03/21 03/15/22 Yes Skeet Latch, MD  sacubitril-valsartan (ENTRESTO) 24-26 MG Take 1 tablet by mouth 2 (two) times daily. 06/23/21  Yes Alisa Graff, FNP    Review of Systems  Constitutional:  Positive for appetite change (decreased) and fatigue.   HENT:  Negative for congestion, postnasal drip and sore throat.   Eyes: Negative.   Respiratory:  Positive for shortness of breath (worse over the last few days). Negative for cough, chest tightness and wheezing.   Cardiovascular:  Positive for palpitations. Negative for chest pain and leg swelling.  Gastrointestinal:  Negative for abdominal distention and abdominal pain.  Endocrine: Negative.   Genitourinary: Negative.   Musculoskeletal:  Positive for back pain. Negative for arthralgias and neck pain.  Skin: Negative.   Allergic/Immunologic: Negative.   Neurological:  Negative for dizziness and light-headedness.  Hematological:  Negative for adenopathy. Does not bruise/bleed easily.  Psychiatric/Behavioral:  Positive for sleep disturbance. Negative for decreased concentration and dysphoric mood. The patient is not nervous/anxious.    Vitals:   03/15/22 1034  BP: (!) 156/103  Pulse: (!) 104  Resp: 20  SpO2: 97%  Weight: 243 lb 4 oz (110.3 kg)  Height: 6\' 1"  (1.854 m)   Wt Readings from Last 3 Encounters:  03/15/22 243 lb 4 oz (110.3 kg)  02/06/22 243 lb 8 oz (110.5 kg)  12/29/21 248 lb 6 oz (112.7 kg)   Lab Results  Component Value Date   CREATININE 1.02 03/15/2022   CREATININE 1.27 11/23/2021   CREATININE 1.13 11/16/2021   Physical Exam Vitals and nursing note reviewed.  Constitutional:      General: He is not in acute distress.    Appearance: He is well-developed.  HENT:     Head: Normocephalic and atraumatic.  Neck:     Vascular: No JVD.  Cardiovascular:     Rate and Rhythm: Regular rhythm. Tachycardia present.  Pulmonary:     Effort: Pulmonary effort is normal.     Breath sounds: No wheezing, rhonchi or rales.  Abdominal:     Palpations: Abdomen is soft.     Tenderness: There is no abdominal tenderness.  Musculoskeletal:     Cervical back: Normal range of motion and neck supple.     Right lower leg: No edema.     Left lower leg: No edema.  Skin:     General: Skin is warm and dry.  Neurological:     General: No focal deficit present.     Mental Status: He is alert and oriented to person, place, and time.  Psychiatric:        Mood and Affect: Mood is anxious.        Behavior: Behavior normal.    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- - NYHA class III - euvolemic today - weighing daily; reminded to call for an overnight weight gain of > 2 pounds or a weekly weight gain of >5 pounds - weight stable from last visit here 2.5 months ago - not adding salt and tries to review food labels - saw cardiology Marilynn Rail) on 10/03/21 - on GDMT of jardiance, entresto and coreg  - would like to add MRA  but unsure if he can manage getting the frequent labs drawn - due to worsening SOB, will increase his entresto to 49/51mg  BID; advised to finish his current dose by taking 2 tablets BID and then when he starts the new bottle, he will resume taking 1 tablet BID - check labs at next visit if not done elsewhere - also advised to take an extra furosemide for 2 days to see if that helps with his SOB; he says that he didn't think to take an extra dose - BNP 07/06/21 was 160.6 - PharmD reconciled medications with the patient  2: HTN- - BP elevated (156/103);he did not take meds, again, before coming to clinic today - increasing entresto per above - had virtual visit with PCP ([place]) 02/13/22 - BMP 11/23/21 reviewed and showed sodium 141, potassium 4.3, creatinine 1.27 and GFR 69  - recheck BMP today  3: Obstructive sleep apnea-  - says that he hasn't had a complete night sleep in ~ 20 years - had repeat sleep study on 10/07/21  - RX for CPAP with pressure settings faxed to [facility] at last visit; patient says that they are wanting $700 up front before sending supplies/equipment even though he has a new CPAP machine at home that he bought on his own - consider pulmonology referral to assist with this  4:  Diabetes mellitus- - A1C on 11/23/21 was 7.0%   Medication bottles reviewed.   Return in 3 months, sooner if needed

## 2022-03-15 NOTE — Patient Instructions (Addendum)
Continue weighing daily and call for an overnight weight gain of 3 pounds or more or a weekly weight gain of more than 5 pounds.   If you have voicemail, please make sure your mailbox is cleaned out so that we may leave a message and please make sure to listen to any voicemails.    Finish your current dose of entresto by taking 2 tablets in the morning and 2 tablets in the evening. Once you finish this dose and pick up the new dose (49/51mg ), you will go back to taking 1 tablet in the morning and 1 tablet in the evening.    Take an extra furosemide (lasix) today and tomorrow.

## 2022-03-17 NOTE — Progress Notes (Signed)
Patient ID: Kirk Lyons, male   DOB: [birth year], 51 y.o.   MRN: [id number] [facility] FAILURE CLINIC - PHARMACIST COUNSELING NOTE  Guideline-Directed Medical Therapy/Evidence Based Medicine  ACE/ARB/ARNI: Sacubitril-valsartan 24-26 mg twice daily Beta Blocker: Carvedilol 3.125 mg twice daily Aldosterone Antagonist:  n/a Diuretic: Furosemide 40 mg daily SGLT2i: Empagliflozin 10 mg daily  Adherence Assessment  Do you ever forget to take your medication? [] Yes [x] No  Do you ever skip doses due to side effects? [] Yes [x] No  Do you have trouble affording your medicines? [] Yes [x] No  Are you ever unable to pick up your medication due to transportation difficulties? [] Yes [x] No  Do you ever stop taking your medications because you don't believe they are helping? [] Yes [x] No  Do you check your weight daily? [x] Yes [] No   Adherence strategy: none  Barriers to obtaining medications: none  Vital signs: HR 104, BP 156/103, weight (pounds) 110.3kg ECHO: Date 07/20/2021, EF 45-50%, notes mildly decreased function     Latest Ref Rng & Units 03/15/2022   11:34 AM 11/23/2021   10:41 AM 11/16/2021    5:08 PM  BMP  Glucose 70 - 99 mg/dL     BUN 6 - 20 mg/dL 18  12  13    Creatinine 0.61 - 1.24 mg/dL     BUN/Creat Ratio 9 - 20  9    Sodium 135 - 145 mmol/L 141  141  139   Potassium 3.5 - 5.1 mmol/L 4.3  4.3  4.3   Chloride 98 - 111 mmol/L 102  102  108   CO2 22 - 32 mmol/L 24  22  24    Calcium 8.9 - 10.3 mg/dL 9.0  9.2  8.6     Past Medical History:  Diagnosis Date   CHF (congestive heart failure) (HCC)    Essential hypertension 07/12/2021   Gout    Heart failure, type unknown (HCC) 07/12/2021   Hypertension    Obesity (BMI 30.0-34.9) 07/12/2021   OSA (obstructive sleep apnea) 07/12/2021   Tobacco use 07/12/2021    ASSESSMENT 51 year old male who presents to the HF clinic for follow up. HTN, gout, tobacco use and chronic  heart failure. Last ED visit was 12/26/2021  for right side back pain. MedRec was completed. Medication compliance is a problems. Patient likes to self-adjust therapy.  Noted patient complaining of shortness of breath, denies weigh gain, swelling or decreased appetite. BP remains elevated, especially diastolic BP above 100 in the last 3 OV.  PLAN (recommendations)  Additional furosemide dose daily x 2 days Increase Entresto to 49/51mg  BID Repeat BMP in 2 weeks   Time spent: 15 minutes  Temperance Kelemen Rodriguez-Guzman PharmD, BCPS 03/17/2022 2:25 PM   Current Outpatient Medications:    albuterol (VENTOLIN HFA) 108 (90 Base) MCG/ACT inhaler, TAKE 2 PUFFS BY MOUTH EVERY 6 HOURS AS NEEDED FOR WHEEZE OR SHORTNESS OF BREATH, Disp: 8.5 each, Rfl: 1   carvedilol (COREG) 3.125 MG tablet, Take 1 tablet (3.125 mg total) by mouth 2 (two) times daily., Disp: 60 tablet, Rfl: 5   empagliflozin (JARDIANCE) 10 MG TABS tablet, Take 1 tablet (10 mg total) by mouth daily., Disp: 90 tablet, Rfl: 3   furosemide (LASIX) 40 MG tablet, Take 1 tablet (40 mg total) by mouth daily. With additional 40mg  if needed, Disp: 100 tablet, Rfl: 3   metFORMIN (GLUCOPHAGE) 500 MG tablet, Take 1 tablet (500 mg total) by mouth 2 (two) times daily  with a meal., Disp: 60 tablet, Rfl: 3   Multiple Vitamins-Minerals (EMERGEN-C VITAMIN C) PACK, Take 1 Package by mouth as needed (immune system support)., Disp: , Rfl:    rosuvastatin (CRESTOR) 20 MG tablet, Take 1 tablet (20 mg total) by mouth daily., Disp: 90 tablet, Rfl: 3   sacubitril-valsartan (ENTRESTO) 49-51 MG, Take 1 tablet by mouth 2 (two) times daily., Disp: 180 tablet, Rfl: 3   MEDICATION ADHERENCES TIPS AND STRATEGIES Taking medication as prescribed improves patient outcomes in heart failure (reduces hospitalizations, improves symptoms, increases survival) Side effects of medications can be managed by decreasing doses, switching agents, stopping drugs, or adding additional therapy.  Please let someone in the Heart Failure Clinic know if you have having bothersome side effects so we can modify your regimen. Do not alter your medication regimen without talking to [place].  Medication reminders can help patients remember to take drugs on time. If you are missing or forgetting doses you can try linking behaviors, using pill boxes, or an electronic reminder like an alarm on your phone or an app. Some people can also get automated phone calls as medication reminders.

## 2022-04-03 ENCOUNTER — Ambulatory Visit: Payer: 59 | Admitting: Family

## 2022-04-03 ENCOUNTER — Emergency Department: Payer: 59

## 2022-04-03 ENCOUNTER — Emergency Department
Admission: EM | Admit: 2022-04-03 | Discharge: 2022-04-03 | Disposition: A | Discharge status: Home / Self Care | Payer: 59 | Attending: Emergency Medicine | Admitting: Emergency Medicine

## 2022-04-03 ENCOUNTER — Other Ambulatory Visit: Payer: Self-pay

## 2022-04-03 DIAGNOSIS — E119 Type 2 diabetes mellitus without complications: Secondary | ICD-10-CM | POA: Insufficient documentation

## 2022-04-03 DIAGNOSIS — M25572 Pain in left ankle and joints of left foot: Secondary | ICD-10-CM | POA: Diagnosis present

## 2022-04-03 DIAGNOSIS — I509 Heart failure, unspecified: Secondary | ICD-10-CM | POA: Insufficient documentation

## 2022-04-03 DIAGNOSIS — I110 Hypertensive heart disease with heart failure: Secondary | ICD-10-CM | POA: Diagnosis not present

## 2022-04-03 DIAGNOSIS — H6012 Cellulitis of left external ear: Secondary | ICD-10-CM | POA: Diagnosis not present

## 2022-04-03 DIAGNOSIS — H60392 Other infective otitis externa, left ear: Secondary | ICD-10-CM

## 2022-04-03 MED ORDER — CEPHALEXIN 500 MG PO CAPS: 500.0000 mg | ORAL_CAPSULE | Freq: Three times a day (TID) | ORAL | 0 refills | Status: DC

## 2022-04-03 MED ORDER — HYDROCORTISONE 1 % EX CREA: 1.0000 | TOPICAL_CREAM | Freq: Three times a day (TID) | CUTANEOUS | 0 refills | Status: DC

## 2022-04-03 MED ORDER — PREDNISONE 10 MG PO TABS: ORAL_TABLET | ORAL | 0 refills | Status: DC

## 2022-04-03 NOTE — ED Provider Notes (Signed)
[facility] Provider Note    Event Date/Time   First MD Initiated Contact with Patient 04/03/22 1318     (approximate)   History   Ankle Pain   HPI  Kirk Lyons is a 51 y.o. male   presents to the ED with complaint of left ankle pain and swelling that has caused him to have to go home from work 2 nights in a row.  Patient states that he has had a history of gout in the past but is unable to take colchicine due to itching.  Patient has been taking Aleve without any relief.  Patient denies any recent injury.  Patient has history of heart failure, hypertension, CHF, gout, and type 2 diabetes.   Patient also complains of his left ear hurting and has a small nodule that he reports is itching.   Physical Exam   Triage Vital Signs: ED Triage Vitals  Enc Vitals Group     BP 04/03/22 1320 (!) 142/102     Pulse Rate 04/03/22 1320 95     Resp 04/03/22 1320 19     Temp 04/03/22 1320 98.2 F (36.8 C)     Temp src --      SpO2 04/03/22 1320 100 %     Weight --      Height --      Head Circumference --      Peak Flow --      Pain Score 04/03/22 1253 7     Pain Loc --      Pain Edu? --      Excl. in [place]? --     Most recent vital signs: Vitals:   04/03/22 1320  BP: (!) 142/102  Pulse: 95  Resp: 19  Temp: 98.2 F (36.8 C)  SpO2: 100%     General: Awake, no distress.  CV:  Good peripheral perfusion.  Resp:  Normal effort.  Abd:  No distention.  Other:  On examination of the left ankle there is no gross deformity, erythema or warmth on palpation.  No soft tissue edema.  Skin is intact.  Motor sensory function normal and DP pulses bilaterally are strong and equal. Examination of the left ear the auricular has a papule that is tender with a small open area most likely self-inflicted.  There is some flakiness anteriorly without erythema.   ED Results / Procedures / Treatments   Labs (all labs ordered are listed, but only abnormal results are  displayed) Labs Reviewed - No data to display   RADIOLOGY  Left ankle x-ray images were reviewed by myself and interpreted as no acute bony injury or dislocation present.  Radiology commented on some mild degenerative changes.   PROCEDURES:  Critical Care performed:   Procedures   MEDICATIONS ORDERED IN ED: Medications - No data to display   IMPRESSION / MDM / [place] / ED COURSE  I reviewed the triage vital signs and the nursing notes.   Differential diagnosis includes, but is not limited to, acute left ankle pain, gout, osteoarthritis, fracture, skin infection left ear, itching left ear.  51 year old male presents to the ED with complaint of 2 days left ankle pain without history of injury.  Patient has had a history of gout in the past and was taking colchicine until he developed itching.  He has been unsuccessful controlling his pain with over-the-counter anti-inflammatory.  X-rays were taken and patient was made aware that there is some mild degenerative  changes present.  Also noted on his exam left ear has a small open skin lesion externally without active drainage.  Patient is noted multiple times to be scratching at the area and there is some flaky skin anteriorly to this area.  Patient will be placed on Keflex 3 times daily for 7 days and also given a prescription for hydrocortisone to apply to this to prevent itching and he is encouraged not to scratch at this area.  A prescription for prednisone 30 mg for 5 days was also sent to the pharmacy.  Patient was given a note for work.  He is encouraged to follow-up with his PCP if any continued problems.      Patient's presentation is most consistent with acute complicated illness / injury requiring diagnostic workup.  FINAL CLINICAL IMPRESSION(S) / ED DIAGNOSES   Final diagnoses:  Acute left ankle pain  Skin of left earlobe with infection     Rx / DC Orders   ED Discharge Orders          Ordered     predniSONE (DELTASONE) 10 MG tablet        04/03/22 1453    cephALEXin (KEFLEX) 500 MG capsule  3 times daily        04/03/22 1453    hydrocortisone cream 1 %  3 times daily        04/03/22 1453             Note:  This document was prepared using Dragon voice recognition software and may include unintentional dictation errors.   Tommi Rumps, PA-C 04/03/22 1502    Minna Antis, MD 04/06/22 2311

## 2022-04-03 NOTE — ED Triage Notes (Addendum)
Pt comes with c/o left ankle pain and swelling. Pt also states some left ear pain. Pt unsure if it is his gout or what is going on.

## 2022-04-03 NOTE — Discharge Instructions (Signed)
With your primary care provider if any continued problems or concerns.  Also your ear should be rechecked if not improving by your primary care provider.  A prescription for cephalexin and hydrocortisone cream is for your ear.  Avoid scratching your ear which makes the area more infected and less likely to heal.  The prednisone is 3 tablets once a day for the next 5 days which is for your ankle.

## 2022-04-11 ENCOUNTER — Encounter (HOSPITAL_BASED_OUTPATIENT_CLINIC_OR_DEPARTMENT_OTHER): Payer: Self-pay | Admitting: Cardiovascular Disease

## 2022-04-11 ENCOUNTER — Telehealth (HOSPITAL_BASED_OUTPATIENT_CLINIC_OR_DEPARTMENT_OTHER): Payer: Self-pay | Admitting: *Deleted

## 2022-04-11 ENCOUNTER — Ambulatory Visit (INDEPENDENT_AMBULATORY_CARE_PROVIDER_SITE_OTHER): Payer: 59 | Admitting: Cardiovascular Disease

## 2022-04-11 DIAGNOSIS — G4733 Obstructive sleep apnea (adult) (pediatric): Secondary | ICD-10-CM

## 2022-04-11 DIAGNOSIS — E7800 Pure hypercholesterolemia, unspecified: Secondary | ICD-10-CM

## 2022-04-11 DIAGNOSIS — I5042 Chronic combined systolic (congestive) and diastolic (congestive) heart failure: Secondary | ICD-10-CM

## 2022-04-11 DIAGNOSIS — I10 Essential (primary) hypertension: Secondary | ICD-10-CM | POA: Diagnosis not present

## 2022-04-11 NOTE — Patient Instructions (Signed)
Medication Instructions:  Your physician recommends that you continue on your current medications as directed. Please refer to the Current Medication list given to you today.   *If you need a refill on your cardiac medications before your next appointment, please call your pharmacy*  Lab Work: LP/CMET TODAY   If you have labs (blood work) drawn today and your tests are completely normal, you will receive your results only by: [place] (if you have MyChart) OR A paper copy in the mail If you have any lab test that is abnormal or we need to change your treatment, we will call you to review the results.  Testing/Procedures: NONE  Follow-Up: At [facility], you and your health needs are our priority.  As part of our continuing mission to provide you with exceptional heart care, we have created designated Provider Care Teams.  These Care Teams include your primary Cardiologist (physician) and Advanced Practice Providers (APPs -  Physician Assistants and Nurse Practitioners) who all work together to provide you with the care you need, when you need it.  We recommend signing up for the patient portal called "MyChart".  Sign up information is provided on this After Visit Summary.  MyChart is used to connect with patients for Virtual Visits (Telemedicine).  Patients are able to view lab/test results, encounter notes, upcoming appointments, etc.  Non-urgent messages can be sent to your provider as well.   To learn more about what you can do with MyChart, go to [email or web address].    Your next appointment:   6 month(s)  The format for your next appointment:   In Person  Provider:   Skeet Latch, MD    Other Instructions  WILL REACH OUT TO SLEEP TEAM TO SEE IF THEY CAN ASSIST YOU [place]

## 2022-04-11 NOTE — Assessment & Plan Note (Signed)
Lipids were uncontrolled on last check.  Repeat fasting lipids and a CMP today.  Continue rosuvastatin for now.

## 2022-04-11 NOTE — Assessment & Plan Note (Signed)
Mr. Kirk Lyons has OSA and has a CPAP machine but doesn't know how to get it calibrated.  He doesn't want to use a new machine when he has one.  We will touch base with our sleep medicine team to see if we can help.

## 2022-04-11 NOTE — Progress Notes (Signed)
Cardiology Office Note:    Date:  04/11/2022   ID:  Kirk Lyons, DOB 1971-02-22, MRN [id number]  PCP:  [place], Raymond J, MD  Cardiologist:  Chilton Si, MD  Nephrologist:  Referring MD: Larae Grooms, NP   CC: Hypertension  History of Present Illness:    Kirk Lyons is a 51 y.o. male with a hx of hypertension, chronic heart failure, gout, and tobacco use, here for follow-up. He was initially seen 07/12/2021 in the Advanced Hypertension Clinic. Previously he was in the ED 04/27/2021 after falling out of bed in the AM and striking his right-sided face on furniture. He reported having a CPAP but not wearing it, and that he frequently fell out of bed due to this. Kirk Lyons was seen in the ED 06/09/21 following 1 month of worsening shortness of breath and cough. He also reported lower middle chest and epigastric tightness, and orthopnea. He noted being out of all his medications for about a week. EKG showed SR, rate 99, iRBBB, LAFB and a QTc interval of 505 without other clear evidence of acute ischemia or significant arrhythmia. CXR revealed mild pulmonary edema. He was hypertensive on arrival with BP 191/100 (HR 101) on room air. Elevated troponin at 38 and 40 thought to be due to demand ischemia. He was given IV lasix and admission was recommended, but he declined. His symptoms improved and he was discharged with 20 mg Lasix for a few days and continuing his lisinopril. Lisinopril was later switched to [facility] by Ms. Hackney. He was last seen in Cardiology by Clarisa Kindred, FNP 07/06/21. His blood pressure at that visit was 145/106, HR 106. He reported a worsening cough since beginning Entresto, but did not wish to stop it as his breathing was improving significantly. He has not had a sleep study, but has been wearing a CPAP that reportedly self-regulates given to him by a friend. Also noted sleep eating and talking, and pain from his tongue pushing hard against his teeth.  Carvedilol 3.125 mg BID was started due to continued tachycardia. He is scheduled for an Echo on 07/20/21. A sleep study was scheduled for 07/21/21.   At his initial visit his blood pressures were much better controlled in general but he hadn't taken his medications that day. [name] was added and he was referred to our Child psychotherapist. He saw Edd Fabian, NP on 09/2021 and was feeling well. He was accidentally taking both Jardiance and Lasix twice a day. Blood pressure was 110/88. Today, he complains of excessive fatigue and daytime somnolence to the point of struggling to stay awake at work. He is having trouble treating his sleep apnea. He has a brand new CPAP machine but is unable to calibrate it. He saw a score of 24 for narcolepsy from his sleep study results. About a month and a half ago, he began having shortness of breath with minimal exertion so his dose for entresto was doubled. For exercise he walks a couple miles a day. He reports losing about 15 lbs since his last visit and about 4 lbs in the last month. At this time he has stopped consuming breads, pastas, and M&M's. He occasionally will eat a cheeseburger but has been mainly trying to incorporate healthier foods in his diet like salads and chicken breast. He avoids driving due to fear of falling asleep while driving. He denies any palpitations, or chest pain. No lightheadedness, headaches, syncope, orthopnea, or PND.   Previous antihypertensives: Amlodipine Lisinopril  Past Medical History:  Diagnosis Date   CHF (congestive heart failure) (HCC)    Chronic combined systolic and diastolic heart failure ([place]) 07/12/2021   Chronic diastolic heart failure ([place]) 07/12/2021   Essential hypertension 07/12/2021   Gout    Heart failure, type unknown ([place]) 07/12/2021   Hypertension    Obesity (BMI 30.0-34.9) 07/12/2021   OSA (obstructive sleep apnea) 07/12/2021   Tobacco use 07/12/2021    Past Surgical History:  Procedure Laterality Date    HERNIA REPAIR      Current Medications: Current Meds  Medication Sig   albuterol (VENTOLIN HFA) 108 (90 Base) MCG/ACT inhaler TAKE 2 PUFFS BY MOUTH EVERY 6 HOURS AS NEEDED FOR WHEEZE OR SHORTNESS OF BREATH   carvedilol (COREG) 3.125 MG tablet Take 1 tablet (3.125 mg total) by mouth 2 (two) times daily.   empagliflozin (JARDIANCE) 10 MG TABS tablet Take 1 tablet (10 mg total) by mouth daily.   furosemide (LASIX) 40 MG tablet Take 1 tablet (40 mg total) by mouth daily. With additional 40mg  if needed   Hydrocortisone, Perianal, 1 % CREA Apply topically daily as needed.   metFORMIN (GLUCOPHAGE) 500 MG tablet Take 1 tablet (500 mg total) by mouth 2 (two) times daily with a meal.   Multiple Vitamins-Minerals (EMERGEN-C VITAMIN C) PACK Take 1 Package by mouth as needed (immune system support).   predniSONE (DELTASONE) 10 MG tablet Take 3 tablets once a day for 5 days   rosuvastatin (CRESTOR) 20 MG tablet Take 1 tablet (20 mg total) by mouth daily.   sacubitril-valsartan (ENTRESTO) 49-51 MG Take 1 tablet by mouth 2 (two) times daily.   [DISCONTINUED] cephALEXin (KEFLEX) 500 MG capsule Take 1 capsule (500 mg total) by mouth 3 (three) times daily.   [DISCONTINUED] hydrocortisone cream 1 % Apply 1 Application topically 3 (three) times daily. Apply to outer ear for itching     Allergies:   Erythromycin   Social History   Socioeconomic History   Marital status: Single    Spouse name: Not on file   Number of children: Not on file   Years of education: Not on file   Highest education level: Not on file  Occupational History   Not on file  Tobacco Use   Smoking status: Former    Types: Cigarettes   Smokeless tobacco: Never   Tobacco comments:    May smoke a cigarette if he has a beer  Vaping Use   Vaping Use: Never used  Substance and Sexual Activity   Alcohol use: Not Currently    Comment: social   Drug use: No   Sexual activity: Not Currently  Other Topics Concern   Not on file   Social History Narrative   Not on file   Social Determinants of Health   Financial Resource Strain: High Risk (07/12/2021)   Overall Financial Resource Strain (CARDIA)    Difficulty of Paying Living Expenses: Very hard  Food Insecurity: No Food Insecurity (07/12/2021)   Hunger Vital Sign    Worried About Running Out of Food in the Last Year: Never true    Ran Out of Food in the Last Year: Never true  Transportation Needs: Unmet Transportation Needs (07/12/2021)   PRAPARE - Transportation    Lack of Transportation (Medical): No    Lack of Transportation (Non-Medical): Yes  Physical Activity: Unknown (07/12/2021)   Exercise Vital Sign    Days of Exercise per Week: 5 days    Minutes of Exercise per Session: Not  on file  Stress: Stress Concern Present (07/12/2021)   [place]    Feeling of Stress : Very much  Social Connections: Socially Isolated (07/12/2021)   Social Connection and Isolation Panel [NHANES]    Frequency of Communication with Friends and Family: More than three times a week    Frequency of Social Gatherings with Friends and Family: Once a week    Attends Religious Services: Never    Marine scientist or Organizations: No    Attends Music therapist: Never    Marital Status: Never married     Family History: The patient's family history includes Breast cancer in his mother; Coronary artery disease in his father; Hyperlipidemia in his father; Prostate cancer in his father; Testicular cancer in his brother.  ROS:   Please see the history of present illness.    (+) Daytime somnolence (+) Fatigue (+) Shortness of breath (+) Swelling in LE All other systems reviewed and are negative.  EKGs/Labs/Other Studies Reviewed:    DG CXR 06/09/2021: COMPARISON:  06/06/2020   FINDINGS: Cardiac shadow is enlarged increased from the prior exam. Lungs are well aerated bilaterally.  Vascular congestion is seen with minimal interstitial edema. No sizable effusion is noted. No bony abnormality is seen.   IMPRESSION: Changes consistent with mild CHF.  Echo 07/20/21:  1. Left ventricular ejection fraction, by estimation, is 45 to 50%. The  left ventricle has mildly decreased function. The left ventricle  demonstrates global hypokinesis. There is mild left ventricular  hypertrophy. Left ventricular diastolic parameters  are consistent with Grade II diastolic dysfunction (pseudonormalization).   2. Right ventricular systolic function is normal. The right ventricular  size is normal. Moderately increased right ventricular wall thickness.  Tricuspid regurgitation signal is inadequate for assessing PA pressure.   3. Left atrial size was mildly dilated.   4. Right atrial size was mildly dilated.   5. The mitral valve is grossly normal. Trivial mitral valve  regurgitation.   6. The aortic valve was not well visualized. Aortic valve regurgitation  is not visualized.   7. There is mild dilatation of the ascending aorta, measuring 38 mm.   EKG:  EKG is personally reviewed. 04/11/2022:  EKG was not ordered. 07/12/2021: Sinus rhythm. Rate 96 bpm. iRBBB. Indeterminate axis, cannot rule out prior inferior infarct  Recent Labs: 06/09/2021: Magnesium 1.9 07/06/2021: B Natriuretic Peptide 160.6 11/23/2021: ALT 26; Hemoglobin 18.2; Platelets 298 03/15/2022: BUN 18; Creatinine, Ser 1.02; Potassium 4.3; Sodium 141   Recent Lipid Panel    Component Value Date/Time   CHOL 301 (H) 07/12/2021 1048   TRIG 365 (H) 07/12/2021 1048   HDL 51 07/12/2021 1048   CHOLHDL 5.9 (H) 07/12/2021 1048   LDLCALC 179 (H) 07/12/2021 1048    Physical Exam:    VS:  BP 116/83 (BP Location: Left Arm, Patient Position: Sitting, Cuff Size: Large)   Pulse 87   Ht 6\' 1"  (1.854 m)   Wt 244 lb 9.6 oz (110.9 kg)   SpO2 96%   BMI 32.27 kg/m  , BMI Body mass index is 32.27 kg/m. GENERAL:  Well  appearing HEENT: Pupils equal round and reactive, fundi not visualized, oral mucosa unremarkable NECK:  No jugular venous distention, waveform within normal limits, carotid upstroke brisk and symmetric, no bruits, no thyromegaly LUNGS:  Clear to auscultation bilaterally HEART:  RRR.  PMI not displaced or sustained,S1 and S2 within normal limits, no S3, no S4, no  clicks, no rubs, no murmurs ABD:  Flat, positive bowel sounds normal in frequency in pitch, no bruits, no rebound, no guarding, no midline pulsatile mass, no hepatomegaly, no splenomegaly EXT:  2 plus pulses throughout, no edema, no cyanosis no clubbing SKIN:  No rashes no nodules NEURO:  Cranial nerves II through XII grossly intact, motor grossly intact throughout PSYCH:  Cognitively intact, oriented to person place and time   ASSESSMENT/PLAN:    OSA (obstructive sleep apnea) Kirk Lyons has OSA and has a CPAP machine but doesn't know how to get it calibrated.  He doesn't want to use a new machine when he has one.  We will touch base with our sleep medicine team to see if we can help.  Hypercholesterolemia Lipids were uncontrolled on last check.  Repeat fasting lipids and a CMP today.  Continue rosuvastatin for now.  Essential hypertension Blood pressure is very well controlled.  He is doing well on Entresto and carvedilol.  Check CMP today given that he recently increased his dose of Entresto.  Blood pressure goal is less than 130/80.  Chronic combined systolic and diastolic heart failure ([place]) He is euvolemic and BP is well-controlled.  Continue carvedilol, Entresto, Jardiance and lasix.    Screening for Secondary Hypertension:     07/12/2021   10:00 AM  Causes  Drugs/Herbals Screened     - Comments limits sodium intake as best he can    Relevant Labs/Studies:    Latest Ref Rng & Units 03/15/2022   11:34 AM 11/23/2021   10:41 AM 11/16/2021    5:08 PM  Basic Labs  Sodium 135 - 145 mmol/L 141  141  139   Potassium 3.5 -  5.1 mmol/L 4.3  4.3  4.3   Creatinine 0.61 - 1.24 mg/dL 1.02  1.27  1.13     Disposition:    FU with Aizah Gehlhausen C. Oval Linsey, MD, [facility] in 1 year.  Medication Adjustments/Labs and Tests Ordered: Current medicines are reviewed at length with the patient today.  Concerns regarding medicines are outlined above.   Orders Placed This Encounter  Procedures   Lipid panel   Comprehensive metabolic panel   No orders of the defined types were placed in this encounter.  I,Mathew Stumpf,acting as a Education administrator for Skeet Latch, MD.,have documented all relevant documentation on the behalf of Skeet Latch, MD,as directed by  Skeet Latch, MD while in the presence of Skeet Latch, MD.  I, Alfred Oval Linsey, MD have reviewed all documentation for this visit.  The documentation of the exam, diagnosis, procedures, and orders on 04/11/2022 are all accurate and complete.   Signed, Skeet Latch, MD  04/11/2022 5:32 PM    [place]

## 2022-04-11 NOTE — Assessment & Plan Note (Signed)
Blood pressure is very well controlled.  He is doing well on Entresto and carvedilol.  Check CMP today given that he recently increased his dose of Entresto.  Blood pressure goal is less than 130/80.

## 2022-04-11 NOTE — Assessment & Plan Note (Signed)
He is euvolemic and BP is well-controlled.  Continue carvedilol, Entresto, Jardiance and lasix.

## 2022-04-12 LAB — LIPID PANEL
Chol/HDL Ratio: 3.7 ratio (ref 0.0–5.0)
Cholesterol, Total: 202 mg/dL — ABNORMAL HIGH (ref 100–199)
HDL: 55 mg/dL (ref >39)
LDL Chol Calc (NIH): 120 mg/dL — ABNORMAL HIGH (ref 0–99)
Triglycerides: 155 mg/dL — ABNORMAL HIGH (ref 0–149)
VLDL Cholesterol Cal: 27 mg/dL (ref 5–40)

## 2022-04-12 LAB — COMPREHENSIVE METABOLIC PANEL
ALT: 29 IU/L (ref 0–44)
AST: 23 IU/L (ref 0–40)
Albumin/Globulin Ratio: 1.8 (ref 1.2–2.2)
Albumin: 4.5 g/dL (ref 3.8–4.9)
Alkaline Phosphatase: 73 IU/L (ref 44–121)
BUN/Creatinine Ratio: 12 (ref 9–20)
BUN: 14 mg/dL (ref 6–24)
Bilirubin Total: 0.4 mg/dL (ref 0.0–1.2)
CO2: 25 mmol/L (ref 20–29)
Calcium: 9.2 mg/dL (ref 8.7–10.2)
Chloride: 103 mmol/L (ref 96–106)
Creatinine, Ser: 1.18 mg/dL (ref 0.76–1.27)
Globulin, Total: 2.5 g/dL (ref 1.5–4.5)
Glucose: 125 mg/dL — ABNORMAL HIGH (ref 70–99)
Potassium: 4.6 mmol/L (ref 3.5–5.2)
Sodium: 141 mmol/L (ref 134–144)
Total Protein: 7 g/dL (ref 6.0–8.5)
eGFR: 75 mL/min/{1.73_m2} (ref >59)

## 2022-04-12 NOTE — Telephone Encounter (Signed)
Left patient a message to return a call to discuss his CPAP device.

## 2022-04-17 ENCOUNTER — Telehealth (HOSPITAL_BASED_OUTPATIENT_CLINIC_OR_DEPARTMENT_OTHER): Payer: Self-pay

## 2022-04-17 DIAGNOSIS — E7800 Pure hypercholesterolemia, unspecified: Secondary | ICD-10-CM

## 2022-04-17 MED ORDER — ROSUVASTATIN CALCIUM 40 MG PO TABS: 40.0000 mg | ORAL_TABLET | Freq: Every day | ORAL | 3 refills | Status: DC

## 2022-04-17 NOTE — Telephone Encounter (Addendum)
Seen by patient Kirk Lyons on 04/17/2022 12:57 AM; orders placed and follow up my chart message sent to patient   ----- Message from Loel Dubonnet, NP sent at 04/14/2022  5:03 PM EDT ----- Normal kidneys, liver, electrolytes. Cholesterol panel improved from previous but still above goal. Recommend increase Rosuvastatin to 40mg  daily and repeat FLP/LFT in 2 months.

## 2022-05-04 ENCOUNTER — Other Ambulatory Visit: Payer: Self-pay | Admitting: Family

## 2022-05-12 ENCOUNTER — Encounter (HOSPITAL_COMMUNITY): Payer: Self-pay

## 2022-05-12 ENCOUNTER — Emergency Department (HOSPITAL_COMMUNITY)
Admission: EM | Admit: 2022-05-12 | Discharge: 2022-05-12 | Disposition: A | Discharge status: Home / Self Care | Payer: Self-pay | Attending: Emergency Medicine | Admitting: Emergency Medicine

## 2022-05-12 DIAGNOSIS — I110 Hypertensive heart disease with heart failure: Secondary | ICD-10-CM | POA: Insufficient documentation

## 2022-05-12 DIAGNOSIS — M79675 Pain in left toe(s): Secondary | ICD-10-CM | POA: Insufficient documentation

## 2022-05-12 DIAGNOSIS — E119 Type 2 diabetes mellitus without complications: Secondary | ICD-10-CM | POA: Insufficient documentation

## 2022-05-12 DIAGNOSIS — Z79899 Other long term (current) drug therapy: Secondary | ICD-10-CM | POA: Insufficient documentation

## 2022-05-12 DIAGNOSIS — I509 Heart failure, unspecified: Secondary | ICD-10-CM | POA: Insufficient documentation

## 2022-05-12 DIAGNOSIS — Z720 Tobacco use: Secondary | ICD-10-CM | POA: Insufficient documentation

## 2022-05-12 DIAGNOSIS — M109 Gout, unspecified: Secondary | ICD-10-CM | POA: Insufficient documentation

## 2022-05-12 DIAGNOSIS — Z7984 Long term (current) use of oral hypoglycemic drugs: Secondary | ICD-10-CM | POA: Insufficient documentation

## 2022-05-12 LAB — CBC WITH DIFFERENTIAL/PLATELET
Abs Immature Granulocytes: 0.03 10*3/uL (ref 0.00–0.07)
Basophils Absolute: 0.1 10*3/uL (ref 0.0–0.1)
Basophils Relative: 1 %
Eosinophils Absolute: 0.2 10*3/uL (ref 0.0–0.5)
Eosinophils Relative: 2 %
HCT: 53.2 % — ABNORMAL HIGH (ref 39.0–52.0)
Hemoglobin: 17.3 g/dL — ABNORMAL HIGH (ref 13.0–17.0)
Immature Granulocytes: 0 %
Lymphocytes Relative: 23 %
Lymphs Abs: 2.3 10*3/uL (ref 0.7–4.0)
MCH: 32.6 pg (ref 26.0–34.0)
MCHC: 32.5 g/dL (ref 30.0–36.0)
MCV: 100.4 fL — ABNORMAL HIGH (ref 80.0–100.0)
Monocytes Absolute: 1 10*3/uL (ref 0.1–1.0)
Monocytes Relative: 10 %
Neutro Abs: 6.5 10*3/uL (ref 1.7–7.7)
Neutrophils Relative %: 64 %
Platelets: 255 10*3/uL (ref 150–400)
RBC: 5.3 MIL/uL (ref 4.22–5.81)
RDW: 12.2 % (ref 11.5–15.5)
WBC: 10.1 10*3/uL (ref 4.0–10.5)
nRBC: 0 % (ref 0.0–0.2)

## 2022-05-12 LAB — BASIC METABOLIC PANEL
Anion gap: 7 (ref 5–15)
BUN: 15 mg/dL (ref 6–20)
CO2: 25 mmol/L (ref 22–32)
Calcium: 9 mg/dL (ref 8.9–10.3)
Chloride: 106 mmol/L (ref 98–111)
Creatinine, Ser: 1.21 mg/dL (ref 0.61–1.24)
GFR, Estimated: >60 mL/min (ref >60)
Glucose, Bld: 110 mg/dL — ABNORMAL HIGH (ref 70–99)
Potassium: 4.1 mmol/L (ref 3.5–5.1)
Sodium: 138 mmol/L (ref 135–145)

## 2022-05-12 LAB — URIC ACID: Uric Acid, Serum: 7.8 mg/dL (ref 3.7–8.6)

## 2022-05-12 MED ORDER — INDOMETHACIN 25 MG PO CAPS
50.0000 mg | ORAL_CAPSULE | Freq: Once | ORAL | Status: AC
  Administered 2022-05-12: 50 mg via ORAL
  Filled 2022-05-12: qty 2

## 2022-05-12 MED ORDER — HYDROCODONE-ACETAMINOPHEN 5-325 MG PO TABS: 1.0000 | ORAL_TABLET | Freq: Four times a day (QID) | ORAL | 0 refills | Status: DC | PRN

## 2022-05-12 MED ORDER — HYDROCODONE-ACETAMINOPHEN 5-325 MG PO TABS
1.0000 | ORAL_TABLET | Freq: Once | ORAL | Status: AC
  Administered 2022-05-12: 1 via ORAL
  Filled 2022-05-12: qty 1

## 2022-05-12 MED ORDER — INDOMETHACIN 50 MG PO CAPS: 50.0000 mg | ORAL_CAPSULE | Freq: Three times a day (TID) | ORAL | 0 refills | Status: DC

## 2022-05-12 NOTE — Discharge Instructions (Addendum)
Note the work-up today was overall consistent with gout.  We will treat this with indomethacin as well as pain medicine to take as needed.  Recommend reevaluation in 3 to 5 days.  Please do not hesitate to return to emergency department the worrisome signs and symptoms we discussed become apparent.

## 2022-05-12 NOTE — ED Provider Notes (Signed)
[name] Provider Note   CSN: [id number] Arrival date & time: 05/12/22  1845     History  Chief Complaint  Patient presents with   Gout    Kirk Lyons is a 51 y.o. male.  HPI   51 year old male presents emergency department with complaints of left great toe pain.  Patient states he has a history of gout and feels similar to other gout flareups.  Notes symptom onset approximately 2 days ago.  Denies any known exacerbating factors including red meats, alcohol..  States he has not been compliant with low purine diet.  He has tried allopurinol outpatient but had an allergic type reaction to it and has since stopped.  Patient is taking Aleve over the past 2 days with minimal to relief of symptoms.  Denies fever, chills, night sweats, known trauma/injury to affected foot, history of IV drug use, weakness/sensory deficits in affected foot.  Past medical history significant for diabetes, obesity, CHF, gout, hypertension, OSA, tobacco use, hypercholesterolemia  Home Medications Prior to Admission medications   Medication Sig Start Date End Date Taking? Authorizing Provider  HYDROcodone-acetaminophen (NORCO/VICODIN) 5-325 MG tablet Take 1 tablet by mouth every 6 (six) hours as needed for severe pain. 05/12/22  Yes Sherian Maroon A, PA  indomethacin (INDOCIN) 50 MG capsule Take 1 capsule (50 mg total) by mouth 3 (three) times daily with meals. 05/12/22  Yes Sherian Maroon A, PA  albuterol (VENTOLIN HFA) 108 (90 Base) MCG/ACT inhaler TAKE 2 PUFFS BY MOUTH EVERY 6 HOURS AS NEEDED FOR WHEEZE OR SHORTNESS OF BREATH 03/06/22   [place], Buren Kos, MD  carvedilol (COREG) 3.125 MG tablet TAKE 1 TABLET BY MOUTH 2 TIMES DAILY. 05/04/22   Delma Freeze, FNP  empagliflozin (JARDIANCE) 10 MG TABS tablet Take 1 tablet (10 mg total) by mouth daily. 10/03/21   Ronney Asters, NP  furosemide (LASIX) 40 MG tablet Take 1 tablet (40 mg total) by mouth daily. With  additional 40mg  if needed 03/15/22   03/17/22 A, FNP  Hydrocortisone, Perianal, 1 % CREA Apply topically daily as needed. 04/03/22   [provider]  metFORMIN (GLUCOPHAGE) 500 MG tablet Take 1 tablet (500 mg total) by mouth 2 (two) times daily with a meal. 08/03/21   08/05/21, MD  Multiple Vitamins-Minerals (EMERGEN-C VITAMIN C) PACK Take 1 Package by mouth as needed (immune system support).    [provider]  predniSONE (DELTASONE) 10 MG tablet Take 3 tablets once a day for 5 days 04/03/22   04/05/22, PA-C  rosuvastatin (CRESTOR) 40 MG tablet Take 1 tablet (40 mg total) by mouth daily. 04/17/22 04/12/23  04/14/23, NP  sacubitril-valsartan (ENTRESTO) 49-51 MG Take 1 tablet by mouth 2 (two) times daily. 03/15/22   03/17/22, FNP      Allergies    Erythromycin    Review of Systems   Review of Systems  All other systems reviewed and are negative.   Physical Exam Updated Vital Signs BP (!) 159/119   Pulse (!) 106   Temp 97.9 F (36.6 C) (Oral)   Resp 16   SpO2 96%  Physical Exam Vitals and nursing note reviewed.  Constitutional:      General: He is not in acute distress.    Appearance: He is well-developed.  HENT:     Head: Normocephalic and atraumatic.  Eyes:     Conjunctiva/sclera: Conjunctivae normal.  Cardiovascular:     Rate and  Rhythm: Normal rate and regular rhythm.     Heart sounds: No murmur heard. Pulmonary:     Effort: Pulmonary effort is normal. No respiratory distress.     Breath sounds: Normal breath sounds.  Abdominal:     Palpations: Abdomen is soft.     Tenderness: There is no abdominal tenderness.  Musculoskeletal:        General: No swelling.     Cervical back: Neck supple.     Comments: Patient has erythematous left first metatarsal phalangeal joint.  Area is tender to palpation.  Area is warm to the touch.  Pain is elicited with active range of motion.  Dorsalis pedis pulses full and intact bilaterally.   No obvious breaks in skin noted.  Skin:    General: Skin is warm and dry.     Capillary Refill: Capillary refill takes less than 2 seconds.  Neurological:     Mental Status: He is alert.  Psychiatric:        Mood and Affect: Mood normal.     ED Results / Procedures / Treatments   Labs (all labs ordered are listed, but only abnormal results are displayed) Labs Reviewed  BASIC METABOLIC PANEL - Abnormal; Notable for the following components:      Result Value   Glucose, Bld 110 (*)    All other components within normal limits  CBC WITH DIFFERENTIAL/PLATELET - Abnormal; Notable for the following components:   Hemoglobin 17.3 (*)    HCT 53.2 (*)    MCV 100.4 (*)    All other components within normal limits  URIC ACID    EKG None  Radiology No results found.  Procedures Procedures    Medications Ordered in ED Medications  indomethacin (INDOCIN) capsule 50 mg (has no administration in time range)  HYDROcodone-acetaminophen (NORCO/VICODIN) 5-325 MG per tablet 1 tablet (has no administration in time range)    ED Course/ Medical Decision Making/ A&P                           Medical Decision Making Risk Prescription drug management.   This patient presents to the ED for concern of left great toe pain, this involves an extensive number of treatment options, and is a complaint that carries with it a high risk of complications and morbidity.  The differential diagnosis includes septic arthritis, osteoarthritis, gout, erysipelas, cellulitis, rheumatoid arthritis   Co morbidities that complicate the patient evaluation  See HPI   Additional history obtained:  Additional history obtained from EMR External records from outside source obtained and reviewed including hospital records   Lab Tests:  No leukocytosis.  No evidence anemia.  Platelets within normal range.  No electrolyte abnormalities noted.  Renal function within normal limits.  Uric acid within normal  limits.   Imaging Studies ordered:  N/a   Cardiac Monitoring: / EKG:  The patient was maintained on a cardiac monitor.  I personally viewed and interpreted the cardiac monitored which showed an underlying rhythm of: Sinus rhythm   Consultations Obtained:  N/a   Problem List / ED Course / Critical interventions / Medication management  Gout I ordered medication including indomethacin and Norco for acute gout flare   Reevaluation of the patient after these medicines showed that the patient improved I have reviewed the patients home medicines and have made adjustments as needed   Social Determinants of Health:  Chronic tobacco use.  Denies illicit drug use.  Test / Admission - Considered:  Podagra Vitals signs significant for initial tachycardia with a rate of 106 which decreased with administration of pain medicine and indomethacin while emergency department.. Otherwise within normal range and stable throughout visit. Laboratory/imaging studies significant for: See above Patient symptoms likely secondary to acute gout flare.  Patient responded well to indomethacin as well as Norco administered while emergency department.  Continued outpatient management with similar medications.  Patient recommended low purine diet as discussed at length.  Recommend close follow-up with PCP for reevaluation in 3 to 5 days.  Treatment plan discussed at length with patient he acknowledged understand was agreeable to said plan. Worrisome signs and symptoms were discussed with the patient, and the patient acknowledged understanding to return to the ED if noticed. Patient was stable upon discharge.          Final Clinical Impression(s) / ED Diagnoses Final diagnoses:  Podagra    Rx / DC Orders ED Discharge Orders          Ordered    indomethacin (INDOCIN) 50 MG capsule  3 times daily with meals        05/12/22 2140    HYDROcodone-acetaminophen (NORCO/VICODIN) 5-325 MG tablet  Every  6 hours PRN        05/12/22 2140              Peter Garter, [place] 05/12/22 2140    Lorre Nick, MD 05/15/22 1510

## 2022-05-12 NOTE — ED Provider Triage Note (Signed)
Emergency Medicine Provider Triage Evaluation Note  Kirk Lyons , a 51 y.o. male  was evaluated in triage.  Pt complains of left great toe pain.  Has a history of gout.  Believes it is gout.  Pain started yesterday evening.  Has only ever had gout in the right toe.  Was started on allopurinol approximately 1 month ago, but had hives like reaction and was discontinued from this.  Patient is diabetic.  No fevers or chills.  Review of Systems  Positive: As above Negative: As above  Physical Exam  BP (!) 159/119   Pulse (!) 106   Temp 97.9 F (36.6 C) (Oral)   Resp 16   SpO2 96%  Gen:   Awake, no distress   Resp:  Normal effort  MSK:   Moves extremities without difficulty  Other:  Left great toe erythematous.  No obvious swelling.  Patient is able to ambulate without difficulty  Medical Decision Making  Medically screening exam initiated at 7:54 PM.  Appropriate orders placed.  SAMWISE ECKARDT was informed that the remainder of the evaluation will be completed by another provider, this initial triage assessment does not replace that evaluation, and the importance of remaining in the ED until their evaluation is complete.  We will obtain basic labs to evaluate for leukocytosis and kidney function   Roylene Reason, Hershal Coria 05/12/22 1956

## 2022-05-12 NOTE — ED Triage Notes (Signed)
Pt has been having a gout flare up in his L big toe since last night

## 2022-05-15 ENCOUNTER — Ambulatory Visit (HOSPITAL_BASED_OUTPATIENT_CLINIC_OR_DEPARTMENT_OTHER): Payer: 59 | Admitting: Family Medicine

## 2022-05-30 ENCOUNTER — Ambulatory Visit (HOSPITAL_BASED_OUTPATIENT_CLINIC_OR_DEPARTMENT_OTHER): Payer: Self-pay | Admitting: Family Medicine

## 2022-06-13 ENCOUNTER — Ambulatory Visit: Payer: Self-pay | Attending: Family | Admitting: Family

## 2022-06-13 ENCOUNTER — Telehealth: Payer: Self-pay | Admitting: Licensed Clinical Social Worker

## 2022-06-13 ENCOUNTER — Encounter: Payer: Self-pay | Admitting: Family

## 2022-06-13 VITALS — BP 135/120 | HR 91 | Resp 20 | Wt 242.5 lb

## 2022-06-13 DIAGNOSIS — I5022 Chronic systolic (congestive) heart failure: Secondary | ICD-10-CM

## 2022-06-13 DIAGNOSIS — Z7984 Long term (current) use of oral hypoglycemic drugs: Secondary | ICD-10-CM | POA: Insufficient documentation

## 2022-06-13 DIAGNOSIS — I5042 Chronic combined systolic (congestive) and diastolic (congestive) heart failure: Secondary | ICD-10-CM | POA: Insufficient documentation

## 2022-06-13 DIAGNOSIS — Z79899 Other long term (current) drug therapy: Secondary | ICD-10-CM | POA: Insufficient documentation

## 2022-06-13 DIAGNOSIS — I10 Essential (primary) hypertension: Secondary | ICD-10-CM

## 2022-06-13 DIAGNOSIS — Z87891 Personal history of nicotine dependence: Secondary | ICD-10-CM | POA: Insufficient documentation

## 2022-06-13 DIAGNOSIS — G4733 Obstructive sleep apnea (adult) (pediatric): Secondary | ICD-10-CM | POA: Insufficient documentation

## 2022-06-13 DIAGNOSIS — I110 Hypertensive heart disease with heart failure: Secondary | ICD-10-CM | POA: Insufficient documentation

## 2022-06-13 DIAGNOSIS — E119 Type 2 diabetes mellitus without complications: Secondary | ICD-10-CM | POA: Insufficient documentation

## 2022-06-13 MED ORDER — ENTRESTO 49-51 MG PO TABS: 1.0000 | ORAL_TABLET | Freq: Two times a day (BID) | ORAL | 3 refills | Status: DC

## 2022-06-13 NOTE — Telephone Encounter (Signed)
CSW referred to assist patient with obtaining a BP cuff. CSW contacted patient to inform cuff will be delivered to home. Patient grateful for support and assistance. CSW available as needed. Jackie Shalena Ezzell, LCSW, CCSW-MCS [phone]  

## 2022-06-13 NOTE — Patient Instructions (Signed)
Continue weighing daily and call for an overnight weight gain of 3 pounds or more or a weekly weight gain of more than 5 pounds.   If you have voicemail, please make sure your mailbox is cleaned out so that we may leave a message and please make sure to listen to any voicemails.     

## 2022-06-13 NOTE — Progress Notes (Signed)
Patient ID: Kirk Lyons, male    DOB: 10/05/1970, 51 y.o.   MRN: [id number]  Kirk Lyons is a 51 y/o male with a history of HTN, gout, tobacco use and chronic heart failure.   Echo 07/20/21: Left Ventricle: Left ventricular ejection fraction, by estimation, is 45 to 50%. The left ventricle has mildly decreased function. The left ventricle demonstrates global hypokinesis. The left ventricular internal cavity size was normal in size. There is mild left ventricular hypertrophy. Left ventricular diastolic parameters are consistent with Grade II diastolic dysfunction (pseudonormalization). Right Ventricle: The right ventricular size is normal. Moderately increased right ventricular wall thickness. Left Atrium: Left atrial size was mildly dilated. Right Atrium: Right atrial size was mildly dilated  Was in the ED 05/12/22 due to gout where he was treated and released. Was in the ED 04/03/22 due to left ankle pain where he was evaluated and released. Was in the ED 12/26/21 due to right sided back pain. Evaluated and released.   He presents today for a follow-up visit with a chief complaint of minimal fatigue upon moderate exertion. Describes this as chronic in nature. He has associated occasional palpitations, back pain and difficulty sleeping along with this. He denies any dizziness, abdominal distention, pedal edema, chest pain, wheezing, shortness of breath, cough or weight gain.   Says that he just took his medications prior to coming. Normally he takes them at 1pm at work and then again around midnight once he returns home from work.   Has noticed decreased hearing and is wondering if it's his carvedilol although he does mention that he used to work around [employer].   Past Medical History:  Diagnosis Date   CHF (congestive heart failure) (HCC)    Chronic combined systolic and diastolic heart failure (HCC) 07/12/2021   Chronic diastolic heart failure (HCC) 07/12/2021   Essential hypertension  07/12/2021   Gout    Heart failure, type unknown (HCC) 07/12/2021   Hypertension    Obesity (BMI 30.0-34.9) 07/12/2021   OSA (obstructive sleep apnea) 07/12/2021   Tobacco use 07/12/2021   Past Surgical History:  Procedure Laterality Date   HERNIA REPAIR     Family History  Problem Relation Age of Onset   Breast cancer Mother    Hyperlipidemia Father    Coronary artery disease Father    Prostate cancer Father    Testicular cancer Brother    Social History   Tobacco Use   Smoking status: Former    Types: Cigarettes   Smokeless tobacco: Never   Tobacco comments:    May smoke a cigarette if he has a beer  Substance Use Topics   Alcohol use: Not Currently    Comment: social   Allergies  Allergen Reactions   Erythromycin Nausea And Vomiting    vomiting vomiting   Prior to Admission medications   Medication Sig Start Date End Date Taking? Authorizing Provider  albuterol (VENTOLIN HFA) 108 (90 Base) MCG/ACT inhaler TAKE 2 PUFFS BY MOUTH EVERY 6 HOURS AS NEEDED FOR WHEEZE OR SHORTNESS OF BREATH 03/06/22  Yes [place], Buren Kos, MD  carvedilol (COREG) 3.125 MG tablet TAKE 1 TABLET BY MOUTH 2 TIMES DAILY. 05/04/22  Yes Fayez Sturgell, Inetta Fermo A, FNP  empagliflozin (JARDIANCE) 10 MG TABS tablet Take 1 tablet (10 mg total) by mouth daily. 10/03/21  Yes Cleaver, Thomasene Ripple, NP  furosemide (LASIX) 40 MG tablet Take 1 tablet (40 mg total) by mouth daily. With additional 40mg  if needed 03/15/22  Yes [place],  Jarold Song, FNP  metFORMIN (GLUCOPHAGE) 500 MG tablet Take 1 tablet (500 mg total) by mouth 2 (two) times daily with a meal. 08/03/21  Yes Chilton Si, MD  Multiple Vitamins-Minerals (EMERGEN-C VITAMIN C) PACK Take 1 Package by mouth as needed (immune system support).   Yes [provider]  rosuvastatin (CRESTOR) 40 MG tablet Take 1 tablet (40 mg total) by mouth daily. 04/17/22 04/12/23 Yes Alver Sorrow, NP  Hydrocortisone, Perianal, 1 % CREA Apply topically daily as needed. Patient  not taking: Reported on 06/13/2022 04/03/22   [provider]  sacubitril-valsartan (ENTRESTO) 49-51 MG Take 1 tablet by mouth 2 (two) times daily. 06/13/22   Delma Freeze, FNP   Review of Systems  Constitutional:  Positive for fatigue. Negative for appetite change.  HENT:  Negative for congestion, postnasal drip and sore throat.   Eyes: Negative.   Respiratory:  Negative for cough, chest tightness, shortness of breath and wheezing.   Cardiovascular:  Positive for palpitations. Negative for chest pain and leg swelling.  Gastrointestinal:  Negative for abdominal distention and abdominal pain.  Endocrine: Negative.   Genitourinary: Negative.   Musculoskeletal:  Positive for back pain. Negative for arthralgias and neck pain.  Skin: Negative.   Allergic/Immunologic: Negative.   Neurological:  Negative for dizziness and light-headedness.  Hematological:  Negative for adenopathy. Does not bruise/bleed easily.  Psychiatric/Behavioral:  Positive for sleep disturbance. Negative for decreased concentration and dysphoric mood. The patient is not nervous/anxious.    Vitals:   06/13/22 1102  BP: (!) 135/120  Pulse: 91  Resp: 20  SpO2: 95%  Weight: 242 lb 8 oz (110 kg)   Wt Readings from Last 3 Encounters:  06/13/22 242 lb 8 oz (110 kg)  04/11/22 244 lb 9.6 oz (110.9 kg)  04/03/22 243 lb 2.7 oz (110.3 kg)   Lab Results  Component Value Date   CREATININE 1.21 05/12/2022   CREATININE 1.18 04/11/2022   CREATININE 1.02 03/15/2022   Physical Exam Vitals and nursing note reviewed.  Constitutional:      General: He is not in acute distress.    Appearance: He is well-developed.  HENT:     Head: Normocephalic and atraumatic.  Neck:     Vascular: No JVD.  Cardiovascular:     Rate and Rhythm: Normal rate and regular rhythm.  Pulmonary:     Effort: Pulmonary effort is normal.     Breath sounds: No wheezing, rhonchi or rales.  Abdominal:     Palpations: Abdomen is soft.      Tenderness: There is no abdominal tenderness.  Musculoskeletal:     Cervical back: Normal range of motion and neck supple.     Right lower leg: No edema.     Left lower leg: No edema.  Skin:    General: Skin is warm and dry.  Neurological:     General: No focal deficit present.     Mental Status: He is alert and oriented to person, place, and time.  Psychiatric:        Mood and Affect: Mood is not anxious.        Behavior: Behavior normal.    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- - NYHA class II - euvolemic today - weighing daily; reminded to call for an overnight weight gain of > 2 pounds or a weekly weight gain of >5 pounds - weight stable from last visit here 3 months ago; down 13.6 from  initial visit here 1 year ago - walking 2 miles every other day at least; sometimes daily - not adding salt and tries to review food labels - saw cardiology Molli Hazard) on 10/03/21 - on GDMT of jardiance, entresto and coreg  - would like to add MRA but unsure if he can manage getting the frequent labs drawn - BNP 07/06/21 was 160.6  2: HTN- - BP initially elevated (135/120) & unchanged after recheck - just took his meds before his appt today; will increase his entresto/ carvedilol next visit - will see if we can get him a BP cuff so that he can log it at home - had virtual visit with PCP ([place]) 02/13/22 - BMP 05/12/22 reviewed and showed sodium 138, potassium 4.1, creatinine 1.21 and GFR >60   3: Obstructive sleep apnea-  - says that he hasn't had a complete night sleep in ~ 20 years - had repeat sleep study on 10/07/21  - saw cardiology Duke Salvia) 04/11/22 & BP was great at 116/83 (~ 2 hours after taking his meds)  4: Diabetes mellitus- - A1C on 11/23/21 was 7.0%   Medication bottles reviewed.   Return in 1 month, sooner if need.

## 2022-06-27 ENCOUNTER — Telehealth: Payer: Self-pay | Admitting: Family

## 2022-06-27 ENCOUNTER — Ambulatory Visit (HOSPITAL_BASED_OUTPATIENT_CLINIC_OR_DEPARTMENT_OTHER): Payer: Self-pay | Admitting: Family Medicine

## 2022-06-27 NOTE — Telephone Encounter (Signed)
Notifed patient that his re enrollment application for [facility] was approved until     [place], [place]

## 2022-06-28 NOTE — Telephone Encounter (Signed)
Approved until 07/17/23 for [facility] Patient assistance.   Suhayb Anzalone, NT

## 2022-07-17 NOTE — Progress Notes (Deleted)
Patient ID: Kirk Lyons, male    DOB: June 20, 1971, 52 y.o.   MRN: [id number]  Kirk Lyons is a 52 y/o male with a history of HTN, gout, tobacco use and chronic heart failure.   Echo 07/20/21: Left Ventricle: Left ventricular ejection fraction, by estimation, is 45 to 50%. The left ventricle has mildly decreased function. The left ventricle demonstrates global hypokinesis. The left ventricular internal cavity size was normal in size. There is mild left ventricular hypertrophy. Left ventricular diastolic parameters are consistent with Grade II diastolic dysfunction (pseudonormalization). Right Ventricle: The right ventricular size is normal. Moderately increased right ventricular wall thickness. Left Atrium: Left atrial size was mildly dilated. Right Atrium: Right atrial size was mildly dilated  Was in the ED 05/12/22 due to gout where he was treated and released. Was in the ED 04/03/22 due to left ankle pain where he was evaluated and released.   He presents today for a follow-up visit with a chief complaint of   Past Medical History:  Diagnosis Date   CHF (congestive heart failure) ([place])    Chronic combined systolic and diastolic heart failure ([place]) 07/12/2021   Chronic diastolic heart failure ([place]) 07/12/2021   Essential hypertension 07/12/2021   Gout    Heart failure, type unknown ([place]) 07/12/2021   Hypertension    Obesity (BMI 30.0-34.9) 07/12/2021   OSA (obstructive sleep apnea) 07/12/2021   Tobacco use 07/12/2021   Past Surgical History:  Procedure Laterality Date   HERNIA REPAIR     Family History  Problem Relation Age of Onset   Breast cancer Mother    Hyperlipidemia Father    Coronary artery disease Father    Prostate cancer Father    Testicular cancer Brother    Social History   Tobacco Use   Smoking status: Former    Types: Cigarettes   Smokeless tobacco: Never   Tobacco comments:    May smoke a cigarette if he has a beer  Substance Use Topics   Alcohol use: Not  Currently    Comment: social   Allergies  Allergen Reactions   Erythromycin Nausea And Vomiting    vomiting vomiting    Review of Systems  Constitutional:  Positive for fatigue. Negative for appetite change.  HENT:  Negative for congestion, postnasal drip and sore throat.   Eyes: Negative.   Respiratory:  Negative for cough, chest tightness, shortness of breath and wheezing.   Cardiovascular:  Positive for palpitations. Negative for chest pain and leg swelling.  Gastrointestinal:  Negative for abdominal distention and abdominal pain.  Endocrine: Negative.   Genitourinary: Negative.   Musculoskeletal:  Positive for back pain. Negative for arthralgias and neck pain.  Skin: Negative.   Allergic/Immunologic: Negative.   Neurological:  Negative for dizziness and light-headedness.  Hematological:  Negative for adenopathy. Does not bruise/bleed easily.  Psychiatric/Behavioral:  Positive for sleep disturbance. Negative for decreased concentration and dysphoric mood. The patient is not nervous/anxious.      Physical Exam Vitals and nursing note reviewed.  Constitutional:      General: He is not in acute distress.    Appearance: He is well-developed.  HENT:     Head: Normocephalic and atraumatic.  Neck:     Vascular: No JVD.  Cardiovascular:     Rate and Rhythm: Normal rate and regular rhythm.  Pulmonary:     Effort: Pulmonary effort is normal.     Breath sounds: No wheezing, rhonchi or rales.  Abdominal:  Palpations: Abdomen is soft.     Tenderness: There is no abdominal tenderness.  Musculoskeletal:     Cervical back: Normal range of motion and neck supple.     Right lower leg: No edema.     Left lower leg: No edema.  Skin:    General: Skin is warm and dry.  Neurological:     General: No focal deficit present.     Mental Status: He is alert and oriented to person, place, and time.  Psychiatric:        Mood and Affect: Mood is not anxious.        Behavior: Behavior  normal.    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- - NYHA class II - euvolemic today - weighing daily; reminded to call for an overnight weight gain of > 2 pounds or a weekly weight gain of >5 pounds - weight 242.8 from last visit here 1 month ago - walking 2 miles every other day at least - not adding salt and tries to review food labels - saw cardiology Kirk Lyons) on 10/03/21 - on GDMT of jardiance, entresto and coreg  - would like to add MRA but unsure if he can manage getting the frequent labs drawn - BNP 07/06/21 was 160.6  2: HTN- - BP  - had virtual visit with PCP ([place]) 02/13/22 - BMP 05/12/22 reviewed and showed sodium 138, potassium 4.1, creatinine 1.21 and GFR >60   3: Obstructive sleep apnea-  - says that he hasn't had a complete night sleep in ~ 20 years - had repeat sleep study on 10/07/21  - saw cardiology Kirk Lyons) 04/11/22   4: Diabetes mellitus- - A1C on 11/23/21 was 7.0%   Medication bottles reviewed.

## 2022-07-18 ENCOUNTER — Encounter: Payer: Self-pay | Admitting: Family

## 2022-07-18 ENCOUNTER — Telehealth: Payer: Self-pay | Admitting: Family

## 2022-07-18 NOTE — Telephone Encounter (Signed)
Patient did not show for his [facility] Failure [facility] appointment on 07/18/22. Will attempt to reschedule.

## 2022-08-07 ENCOUNTER — Telehealth: Payer: Self-pay | Admitting: *Deleted

## 2022-08-07 NOTE — Telephone Encounter (Signed)
Pt called stating his weight is up 5lbs over night but he hasn't taken his diuretics in a few days because he doesn't like using the restroom so frequently. Pt has an appt tomorrow and will take his diuretics today.   Will route to Lysle Morales to see if he needs to make any other changes.

## 2022-08-08 ENCOUNTER — Encounter: Payer: Self-pay | Admitting: Family

## 2022-08-08 ENCOUNTER — Encounter: Payer: Self-pay | Admitting: Pharmacy Technician

## 2022-08-08 ENCOUNTER — Ambulatory Visit: Payer: Self-pay | Attending: Family | Admitting: Family

## 2022-08-08 VITALS — BP 157/117 | HR 84 | Resp 20 | Wt 245.0 lb

## 2022-08-08 DIAGNOSIS — I5042 Chronic combined systolic (congestive) and diastolic (congestive) heart failure: Secondary | ICD-10-CM | POA: Insufficient documentation

## 2022-08-08 DIAGNOSIS — I5022 Chronic systolic (congestive) heart failure: Secondary | ICD-10-CM

## 2022-08-08 DIAGNOSIS — I10 Essential (primary) hypertension: Secondary | ICD-10-CM

## 2022-08-08 DIAGNOSIS — Z87891 Personal history of nicotine dependence: Secondary | ICD-10-CM | POA: Insufficient documentation

## 2022-08-08 DIAGNOSIS — E669 Obesity, unspecified: Secondary | ICD-10-CM | POA: Insufficient documentation

## 2022-08-08 DIAGNOSIS — E119 Type 2 diabetes mellitus without complications: Secondary | ICD-10-CM | POA: Insufficient documentation

## 2022-08-08 DIAGNOSIS — Z79899 Other long term (current) drug therapy: Secondary | ICD-10-CM | POA: Insufficient documentation

## 2022-08-08 DIAGNOSIS — I110 Hypertensive heart disease with heart failure: Secondary | ICD-10-CM | POA: Insufficient documentation

## 2022-08-08 DIAGNOSIS — Z7984 Long term (current) use of oral hypoglycemic drugs: Secondary | ICD-10-CM | POA: Insufficient documentation

## 2022-08-08 DIAGNOSIS — G4733 Obstructive sleep apnea (adult) (pediatric): Secondary | ICD-10-CM | POA: Insufficient documentation

## 2022-08-08 DIAGNOSIS — Z6830 Body mass index (BMI) 30.0-30.9, adult: Secondary | ICD-10-CM | POA: Insufficient documentation

## 2022-08-08 NOTE — Progress Notes (Signed)
Kirk Lyons - PHARMACIST COUNSELING NOTE  Guideline-Directed Medical Therapy/Evidence Based Medicine  ACE/ARB/ARNI: Sacubitril-valsartan 49-51 mg twice daily Beta Blocker: Carvedilol 3.125 mg twice daily Aldosterone Antagonist:  None Diuretic: Furosemide 40 mg daily SGLT2i: Empagliflozin 10 mg daily  Adherence Assessment  Do you ever forget to take your medication? [] Yes [x] No  Do you ever skip doses due to side effects? [x] Yes [] No  Do you have trouble affording your medicines? [x] Yes [] No  Are you ever unable to pick up your medication due to transportation difficulties? [] Yes [x] No  Do you ever stop taking your medications because you don't believe they are helping? [] Yes [x] No  Do you check your weight daily? [x] Yes [] No   Adherence strategy: Does not use a pill box  Barriers to obtaining medications: Recently lost his job and insurance. Will have a new insurance plan through the marketplace starting February 1st but he expresses concern about being able to afford the copays.  Vital signs: HR 84, BP 157/117, weight (pounds) 245 ECHO: Date 07/2021, EF 45-50%     Latest Ref Rng & Units 05/12/2022    7:58 PM 04/11/2022    3:58 PM 03/15/2022   11:34 AM  BMP  Glucose 70 - 99 mg/dL 110  125  117   BUN 6 - 20 mg/dL 15  14  18    Creatinine 0.61 - 1.24 mg/dL 1.21  1.18  1.02   BUN/Creat Ratio 9 - 20  12    Sodium 135 - 145 mmol/L 138  141  141   Potassium 3.5 - 5.1 mmol/L 4.1  4.6  4.3   Chloride 98 - 111 mmol/L 106  103  102   CO2 22 - 32 mmol/L 25  25  24    Calcium 8.9 - 10.3 mg/dL 9.0  9.2  9.0     Past Medical History:  Diagnosis Date   CHF (congestive heart failure) (HCC)    Chronic combined systolic and diastolic heart failure (HCC) 07/12/2021   Chronic diastolic heart failure ([place]) 07/12/2021   Essential hypertension 07/12/2021   Gout    Heart failure, type unknown ([place]) 07/12/2021   Hypertension    Obesity (BMI  30.0-34.9) 07/12/2021   OSA (obstructive sleep apnea) 07/12/2021   Tobacco use 07/12/2021    ASSESSMENT 52 year old male with PMH HTN, T2DM, HLD, tobacco use who presents to the HF clinic for follow-up. Most recent ECHO in 07/2021 shows EF 45-50%. Regarding GDMT, patient takes Entresto 49/51mg  twice daily, carvedilol 3.125 mg twice daily, and Jardiance 10 mg daily. Patient is also supposed to take furosemide 40 mg daily but he endorses that he only takes it about three times a week because it makes him urinate too frequently. Patient experienced an overnight weight gain of 7 lbs last week, perhaps due to taking furosemide less frequently than prescribed. Patient has already recently lost his job and his insurance as a result. He has enrolled in a plan through the marketplace that begins 07/17/2022 but he is worried about being able to afford those copays. Patient is able to continue taking Entresto and Jardiance because he receives them from the manufacturers through PAP[employer]. We will also give him a gift card that he can use for copays for the time being.  Recent ED Visit (past 6 months): Date - 05/12/2022, CC - Podagra Date - 04/03/2022, CC - Left ankle pain Date - 12/26/2021, CC - Lower back pain  PLAN CHF/HTN Per discussion with Otila Kluver  [place], will increase Entresto to target dose of 97/103mg  twice daily Will hold off on initiating further GDMT like spironolactone until financial situation stabilizes Spironolactone itself might be challenging to initiate due to needing to check potassium regularly and uncertain if patient will be able to present for lab draws Additionally, will decrease furosemide to 20 mg daily which will hopefully enable him to tolerate taking it regularly Continue carvedilol and Farxiga  T2DM 11/2021 A1c 7.0% Continue metformin and Farxiga  HLD 03/2022 LDL 120 Continue Crestor   Time spent: 15 minutes  Will M. Dareen Piano, PharmD PGY-1 Pharmacy Resident 08/08/2022 3:36  PM    Current Outpatient Medications:    albuterol (VENTOLIN HFA) 108 (90 Base) MCG/ACT inhaler, TAKE 2 PUFFS BY MOUTH EVERY 6 HOURS AS NEEDED FOR WHEEZE OR SHORTNESS OF BREATH, Disp: 8.5 each, Rfl: 1   carvedilol (COREG) 3.125 MG tablet, TAKE 1 TABLET BY MOUTH 2 TIMES DAILY., Disp: 180 tablet, Rfl: 3   empagliflozin (JARDIANCE) 10 MG TABS tablet, Take 1 tablet (10 mg total) by mouth daily., Disp: 90 tablet, Rfl: 3   furosemide (LASIX) 40 MG tablet, Take 1 tablet (40 mg total) by mouth daily. With additional 40mg  if needed (Patient taking differently: Take 20 mg by mouth daily. With additional 20mg  if needed), Disp: 100 tablet, Rfl: 3   metFORMIN (GLUCOPHAGE) 500 MG tablet, Take 1 tablet (500 mg total) by mouth 2 (two) times daily with a meal., Disp: 60 tablet, Rfl: 3   Multiple Vitamins-Minerals (EMERGEN-C VITAMIN C) PACK, Take 1 Package by mouth as needed (immune system support)., Disp: , Rfl:    rosuvastatin (CRESTOR) 40 MG tablet, Take 1 tablet (40 mg total) by mouth daily., Disp: 90 tablet, Rfl: 3   sacubitril-valsartan (ENTRESTO) 49-51 MG, Take 1 tablet by mouth 2 (two) times daily. (Patient taking differently: Take 2 tablets by mouth 2 (two) times daily.), Disp: 180 tablet, Rfl: 3   sacubitril-valsartan (ENTRESTO) 97-103 MG, Take 1 tablet by mouth 2 (two) times daily., Disp: , Rfl:    DRUGS TO CAUTION IN HEART FAILURE  Drug or Class Mechanism  Analgesics NSAIDs COX-2 inhibitors Glucocorticoids  Sodium and water retention, increased systemic vascular resistance, decreased response to diuretics   Diabetes Medications Metformin Thiazolidinediones Rosiglitazone (Avandia) Pioglitazone (Actos) DPP4 Inhibitors Saxagliptin (Onglyza) Sitagliptin (Januvia)   Lactic acidosis Possible calcium channel blockade   Unknown  Antiarrhythmics Class I  Flecainide Disopyramide Class III Sotalol Other Dronedarone  Negative inotrope, proarrhythmic   Proarrhythmic, beta  blockade  Negative inotrope  Antihypertensives Alpha Blockers Doxazosin Calcium Channel Blockers Diltiazem Verapamil Nifedipine Central Alpha Adrenergics Moxonidine Peripheral Vasodilators Minoxidil  Increases renin and aldosterone  Negative inotrope    Possible sympathetic withdrawal  Unknown  Anti-infective Itraconazole Amphotericin B  Negative inotrope Unknown  Hematologic Anagrelide Cilostazol   Possible inhibition of PD IV Inhibition of PD III causing arrhythmias  Neurologic/Psychiatric Stimulants Anti-Seizure Drugs Carbamazepine Pregabalin Antidepressants Tricyclics Citalopram Parkinsons Bromocriptine Pergolide Pramipexole Antipsychotics Clozapine Antimigraine Ergotamine Methysergide Appetite suppressants Bipolar Lithium  Peripheral alpha and beta agonist activity  Negative inotrope and chronotrope Calcium channel blockade  Negative inotrope, proarrhythmic Dose-dependent QT prolongation  Excessive serotonin activity/valvular damage Excessive serotonin activity/valvular damage Unknown  IgE mediated hypersensitivy, calcium channel blockade  Excessive serotonin activity/valvular damage Excessive serotonin activity/valvular damage Valvular damage  Direct myofibrillar degeneration, adrenergic stimulation  Antimalarials Chloroquine Hydroxychloroquine Intracellular inhibition of lysosomal enzymes  Urologic Agents Alpha Blockers Doxazosin Prazosin Tamsulosin Terazosin  Increased renin and aldosterone  Adapted from Page , et al. "  Drugs That May Cause or Exacerbate Heart Failure: A Scientific Statement from the American Heart  Association." Circulation 2016; 734:L93-X90. DOI: 10.1161/CIR.0000000000000426   MEDICATION ADHERENCES TIPS AND STRATEGIES Taking medication as prescribed improves patient outcomes in heart failure (reduces hospitalizations, improves symptoms, increases survival) Side effects of medications can be managed by  decreasing doses, switching agents, stopping drugs, or adding additional therapy. Please let someone in the [facility] know if you have having bothersome side effects so we can modify your regimen. Do not alter your medication regimen without talking to [place].  Medication reminders can help patients remember to take drugs on time. If you are missing or forgetting doses you can try linking behaviors, using pill boxes, or an electronic reminder like an alarm on your phone or an app. Some people can also get automated phone calls as medication reminders.

## 2022-08-08 NOTE — Progress Notes (Addendum)
Patient ID: Kirk Lyons, male    DOB: 02-09-1971, 52 y.o.   MRN: [id number]  Mr Volden is a 52 y/o male with a history of HTN, gout, tobacco use and chronic heart failure.   Echo 07/20/21: Left Ventricle: Left ventricular ejection fraction, by estimation, is 45 to 50%. The left ventricle has mildly decreased function. The left ventricle demonstrates global hypokinesis. The left ventricular internal cavity size was normal in size. There is mild left ventricular hypertrophy. Left ventricular diastolic parameters are consistent with Grade II diastolic dysfunction (pseudonormalization). Right Ventricle: The right ventricular size is normal. Moderately increased right ventricular wall thickness. Left Atrium: Left atrial size was mildly dilated. Right Atrium: Right atrial size was mildly dilated  Was in the ED 05/12/22 due to gout where he was treated and released. Was in the ED 04/03/22 due to left ankle pain where he was evaluated and released.   He presents today for a follow-up visit with a chief complaint of minimal fatigue with moderate exertion. Describes this as chronic in nature. Has associated palpitations, back pain, difficulty sleeping and fluctuating weight along with this. Denies any dizziness, abdominal distention, pedal edema, chest pain, wheezing, shortness of breath or cough.   He says that he hadn't been consistently taking his diuretic (~ 3 times/ week) because he didn't like going to the bathroom all the time.   Has lost his job along with his insurance and is unable to afford his medications. Currently receiving jardiance and entresto through patient assistance programs. Says that losing his job is very stressful for him  Past Medical History:  Diagnosis Date   CHF (congestive heart failure) (HCC)    Chronic combined systolic and diastolic heart failure (HCC) 07/12/2021   Chronic diastolic heart failure (HCC) 07/12/2021   Essential hypertension 07/12/2021   Gout    Heart  failure, type unknown (HCC) 07/12/2021   Hypertension    Obesity (BMI 30.0-34.9) 07/12/2021   OSA (obstructive sleep apnea) 07/12/2021   Tobacco use 07/12/2021   Past Surgical History:  Procedure Laterality Date   HERNIA REPAIR     Family History  Problem Relation Age of Onset   Breast cancer Mother    Hyperlipidemia Father    Coronary artery disease Father    Prostate cancer Father    Testicular cancer Brother    Social History   Tobacco Use   Smoking status: Former    Types: Cigarettes   Smokeless tobacco: Never   Tobacco comments:    May smoke a cigarette if he has a beer  Substance Use Topics   Alcohol use: Not Currently    Comment: social   Allergies  Allergen Reactions   Erythromycin Nausea And Vomiting    vomiting vomiting   Prior to Admission medications   Medication Sig Start Date End Date Taking? Authorizing Provider  albuterol (VENTOLIN HFA) 108 (90 Base) MCG/ACT inhaler TAKE 2 PUFFS BY MOUTH EVERY 6 HOURS AS NEEDED FOR WHEEZE OR SHORTNESS OF BREATH 03/06/22  Yes de [place], Buren Kos, MD  carvedilol (COREG) 3.125 MG tablet TAKE 1 TABLET BY MOUTH 2 TIMES DAILY. 05/04/22  Yes Kaisa Wofford, Inetta Fermo A, FNP  empagliflozin (JARDIANCE) 10 MG TABS tablet Take 1 tablet (10 mg total) by mouth daily. 10/03/21  Yes Cleaver, Thomasene Ripple, NP  furosemide (LASIX) 40 MG tablet Take 1 tablet (40 mg total) by mouth daily. With additional 40mg  if needed 03/15/22  Yes 03/17/22 A, FNP  metFORMIN (GLUCOPHAGE) 500 MG tablet Take  1 tablet (500 mg total) by mouth 2 (two) times daily with a meal. 08/03/21  Yes Skeet Latch, MD  Multiple Vitamins-Minerals (EMERGEN-C VITAMIN C) PACK Take 1 Package by mouth as needed (immune system support).   Yes [provider]  rosuvastatin (CRESTOR) 40 MG tablet Take 1 tablet (40 mg total) by mouth daily. 04/17/22 04/12/23 Yes Loel Dubonnet, NP  sacubitril-valsartan (ENTRESTO) 49-51 MG Take 1 tablet by mouth 2 (two) times daily. 06/13/22  Yes  Alisa Graff, FNP    Review of Systems  Constitutional:  Positive for fatigue (worse). Negative for appetite change.  HENT:  Negative for congestion, postnasal drip and sore throat.   Eyes: Negative.   Respiratory:  Negative for cough, chest tightness, shortness of breath and wheezing.        + snoring  Cardiovascular:  Positive for palpitations. Negative for chest pain and leg swelling.  Gastrointestinal:  Negative for abdominal distention and abdominal pain.  Endocrine: Negative.   Genitourinary: Negative.   Musculoskeletal:  Positive for back pain. Negative for arthralgias and neck pain.  Skin: Negative.   Allergic/Immunologic: Negative.   Neurological:  Negative for dizziness and light-headedness.  Hematological:  Negative for adenopathy. Does not bruise/bleed easily.  Psychiatric/Behavioral:  Positive for dysphoric mood and sleep disturbance. Negative for decreased concentration. The patient is not nervous/anxious.    Vitals:   08/08/22 1021  BP: (!) 157/117  Pulse: 84  Resp: 20  SpO2: 95%  Weight: 245 lb (111.1 kg)   Wt Readings from Last 3 Encounters:  08/08/22 245 lb (111.1 kg)  06/13/22 242 lb 8 oz (110 kg)  04/11/22 244 lb 9.6 oz (110.9 kg)   Lab Results  Component Value Date   CREATININE 1.21 05/12/2022   CREATININE 1.18 04/11/2022   CREATININE 1.02 03/15/2022   Physical Exam Vitals and nursing note reviewed.  Constitutional:      General: He is not in acute distress.    Appearance: He is well-developed.  HENT:     Head: Normocephalic and atraumatic.  Neck:     Vascular: No JVD.  Cardiovascular:     Rate and Rhythm: Normal rate and regular rhythm.  Pulmonary:     Effort: Pulmonary effort is normal.     Breath sounds: No wheezing, rhonchi or rales.  Abdominal:     Palpations: Abdomen is soft.     Tenderness: There is no abdominal tenderness.  Musculoskeletal:     Cervical back: Normal range of motion and neck supple.     Right lower leg: No  edema.     Left lower leg: No edema.  Skin:    General: Skin is warm and dry.  Neurological:     General: No focal deficit present.     Mental Status: He is alert and oriented to person, place, and time.  Psychiatric:        Mood and Affect: Mood is not anxious.        Behavior: Behavior normal.    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- - NYHA class II - euvolemic today - weighing daily with fluctuations but he's been weighing different times of day; weigh in the morning and call for an overnight weight gain of > 2 pounds or a weekly weight gain of >5 pounds - weight up 3 pounds from last visit here 1 month ago - reviewed diuretic usage & he's agreeable to taking furosemide as 20mg  daily (instead of  40mg ) - not adding salt and tries to review food labels - saw cardiology Marilynn Rail) on 10/03/21 - on GDMT of jardiance, entresto and coreg  - increase entresto to 97/103mg ; he can finish his current bottle of 49/51mg  by taking 2 tablets BID until gone and then he can begin the 97/103mg  as 1 tablet BID; new RX faxed to [employer] patient assistance - BMP next visit - would like to add MRA but unsure if he can manage getting the frequent labs drawn - BNP 07/06/21 was 160.6 - PharmD reconciled meds w/ patient - gift card provided today   2: HTN- - BP 157/117; increase entresto per above and resuming lower dose of furosemide daily - had virtual visit with PCP (de [place]) 02/13/22 - BMP 05/12/22 reviewed and showed sodium 138, potassium 4.1, creatinine 1.21 and GFR >60   3: Obstructive sleep apnea-  - says that he hasn't had a complete night sleep in ~ 20 years - had repeat sleep study on 10/07/21  - saw cardiology Oval Linsey) 04/11/22  - will reach out to their office and ask them to call him again as his CPAP hasn't been set up and he continues to have symptoms  4: Diabetes mellitus- - A1C on 11/23/21 was 7.0%   Medication bottles reviewed.    Return in 1 month, sooner if needed

## 2022-08-08 NOTE — Patient Instructions (Addendum)
Continue weighing daily and call for an overnight weight gain of 3 pounds or more or a weekly weight gain of more than 5 pounds.   If you have voicemail, please make sure your mailbox is cleaned out so that we may leave a message and please make sure to listen to any voicemails.    Start taking your fluid pill as 1/2 tablet every day.    We will increase your entresto by taking 2 tablets in the morning and 2 tablets in the evening. When your new medication is shipped to you, it will be the 97/103mg  dose and then you will resume taking 1 tablet in the morning and 1 tablet in the evening.

## 2022-09-07 ENCOUNTER — Encounter: Payer: Self-pay | Admitting: Family

## 2022-09-14 ENCOUNTER — Encounter: Payer: Self-pay | Admitting: Family

## 2022-09-27 ENCOUNTER — Encounter (HOSPITAL_BASED_OUTPATIENT_CLINIC_OR_DEPARTMENT_OTHER): Payer: Self-pay

## 2022-09-29 ENCOUNTER — Other Ambulatory Visit
Admission: RE | Admit: 2022-09-29 | Discharge: 2022-09-29 | Disposition: A | Discharge status: Home / Self Care | Payer: Self-pay | Source: Ambulatory Visit | Attending: Family | Admitting: Family

## 2022-09-29 ENCOUNTER — Ambulatory Visit (HOSPITAL_BASED_OUTPATIENT_CLINIC_OR_DEPARTMENT_OTHER): Payer: Self-pay | Admitting: Family

## 2022-09-29 ENCOUNTER — Encounter: Payer: Self-pay | Admitting: Family

## 2022-09-29 VITALS — BP 128/97 | HR 87 | Wt 240.0 lb

## 2022-09-29 DIAGNOSIS — E119 Type 2 diabetes mellitus without complications: Secondary | ICD-10-CM

## 2022-09-29 DIAGNOSIS — I5022 Chronic systolic (congestive) heart failure: Secondary | ICD-10-CM | POA: Insufficient documentation

## 2022-09-29 DIAGNOSIS — G4733 Obstructive sleep apnea (adult) (pediatric): Secondary | ICD-10-CM

## 2022-09-29 DIAGNOSIS — I10 Essential (primary) hypertension: Secondary | ICD-10-CM

## 2022-09-29 LAB — BASIC METABOLIC PANEL
Anion gap: 12 (ref 5–15)
BUN: 17 mg/dL (ref 6–20)
CO2: 21 mmol/L — ABNORMAL LOW (ref 22–32)
Calcium: 8.8 mg/dL — ABNORMAL LOW (ref 8.9–10.3)
Chloride: 106 mmol/L (ref 98–111)
Creatinine, Ser: 1.06 mg/dL (ref 0.61–1.24)
GFR, Estimated: >60 mL/min (ref >60)
Glucose, Bld: 95 mg/dL (ref 70–99)
Potassium: 4.1 mmol/L (ref 3.5–5.1)
Sodium: 139 mmol/L (ref 135–145)

## 2022-09-29 NOTE — Progress Notes (Signed)
Patient ID: Kirk Lyons, male    DOB: Sep 14, 1970, 52 y.o.   MRN: [id number]  Kirk Lyons is a 52 y/o male with a history of HTN, gout, tobacco use and chronic heart failure.   Echo 07/20/21: Left Ventricle: Left ventricular ejection fraction, by estimation, is 45 to 50%. The left ventricle has mildly decreased function. The left ventricle demonstrates global hypokinesis. The left ventricular internal cavity size was normal in size. There is mild left ventricular hypertrophy. Left ventricular diastolic parameters are consistent with Grade II diastolic dysfunction (pseudonormalization). Right Ventricle: The right ventricular size is normal. Moderately increased right ventricular wall thickness. Left Atrium: Left atrial size was mildly dilated. Right Atrium: Right atrial size was mildly dilated  Was in the ED 05/12/22 due to gout where he was treated and released. Was in the ED 04/03/22 due to left ankle pain where he was evaluated and released.   He presents today for a HF follow-up visit with a chief complaint of minimal fatigue with moderate exertion. Chronic in nature. Has associated palpitations, difficulty sleeping and depression along with this. Denies abdominal distention, pedal edema, chest pain, wheezing, SOB, cough, dizziness or weight gain.   Still is unemployed which he reports as being quite stressful for him. Does say that he feels like he's feeling better with less fatigue than he was previously experiencing.   Past Medical History:  Diagnosis Date   CHF (congestive heart failure) (HCC)    Chronic combined systolic and diastolic heart failure ([place]) 07/12/2021   Chronic diastolic heart failure ([place]) 07/12/2021   Essential hypertension 07/12/2021   Gout    Heart failure, type unknown ([place]) 07/12/2021   Hypertension    Obesity (BMI 30.0-34.9) 07/12/2021   OSA (obstructive sleep apnea) 07/12/2021   Tobacco use 07/12/2021   Past Surgical History:  Procedure Laterality Date   HERNIA  REPAIR     Family History  Problem Relation Age of Onset   Breast cancer Mother    Hyperlipidemia Father    Coronary artery disease Father    Prostate cancer Father    Testicular cancer Brother    Social History   Tobacco Use   Smoking status: Former    Types: Cigarettes   Smokeless tobacco: Never   Tobacco comments:    May smoke a cigarette if he has a beer  Substance Use Topics   Alcohol use: Not Currently    Comment: social   Allergies  Allergen Reactions   Erythromycin Nausea And Vomiting    vomiting vomiting   Prior to Admission medications   Medication Sig Start Date End Date Taking? Authorizing Provider  albuterol (VENTOLIN HFA) 108 (90 Base) MCG/ACT inhaler TAKE 2 PUFFS BY MOUTH EVERY 6 HOURS AS NEEDED FOR WHEEZE OR SHORTNESS OF BREATH 03/06/22  Yes de [place], Blondell Reveal, MD  carvedilol (COREG) 3.125 MG tablet TAKE 1 TABLET BY MOUTH 2 TIMES DAILY. 05/04/22  Yes Mirella Gueye, Otila Kluver A, FNP  empagliflozin (JARDIANCE) 10 MG TABS tablet Take 1 tablet (10 mg total) by mouth daily. 10/03/21  Yes Cleaver, Jossie Ng, NP  furosemide (LASIX) 40 MG tablet Take 1 tablet (40 mg total) by mouth daily. With additional 40mg  if needed 03/15/22  Yes Alisa Graff, FNP  metFORMIN (GLUCOPHAGE) 500 MG tablet Take 1 tablet (500 mg total) by mouth 2 (two) times daily with a meal. 08/03/21  Yes Skeet Latch, MD  Multiple Vitamins-Minerals (EMERGEN-C VITAMIN C) PACK Take 1 Package by mouth as needed (immune system support).  Yes [provider]  rosuvastatin (CRESTOR) 40 MG tablet Take 1 tablet (40 mg total) by mouth daily. 04/17/22 04/12/23 Yes Loel Dubonnet, NP  sacubitril-valsartan (ENTRESTO) 97-103 MG Take 1 tablet by mouth 2 (two) times daily.   Yes [provider]   Review of Systems  Constitutional:  Positive for fatigue. Negative for appetite change.  HENT:  Negative for congestion, postnasal drip and sore throat.   Eyes: Negative.   Respiratory:  Negative for cough,  chest tightness, shortness of breath and wheezing.   Cardiovascular:  Positive for palpitations. Negative for chest pain and leg swelling.  Gastrointestinal:  Negative for abdominal distention and abdominal pain.  Endocrine: Negative.   Genitourinary: Negative.   Musculoskeletal:  Positive for back pain. Negative for arthralgias and neck pain.  Skin: Negative.   Allergic/Immunologic: Negative.   Neurological:  Negative for dizziness and light-headedness.  Hematological:  Negative for adenopathy. Does not bruise/bleed easily.  Psychiatric/Behavioral:  Positive for dysphoric mood and sleep disturbance. Negative for decreased concentration. The patient is not nervous/anxious.    Vitals:   09/29/22 1108  BP: (!) 128/97  Pulse: 87  SpO2: 95%  Weight: 240 lb (108.9 kg)   Wt Readings from Last 3 Encounters:  09/29/22 240 lb (108.9 kg)  08/08/22 245 lb (111.1 kg)  06/13/22 242 lb 8 oz (110 kg)   Lab Results  Component Value Date   CREATININE 1.21 05/12/2022   CREATININE 1.18 04/11/2022   CREATININE 1.02 03/15/2022   Physical Exam Vitals and nursing note reviewed.  Constitutional:      General: He is not in acute distress.    Appearance: He is well-developed.  HENT:     Head: Normocephalic and atraumatic.  Neck:     Vascular: No JVD.  Cardiovascular:     Rate and Rhythm: Normal rate and regular rhythm.  Pulmonary:     Effort: Pulmonary effort is normal.     Breath sounds: No wheezing, rhonchi or rales.  Abdominal:     Palpations: Abdomen is soft.     Tenderness: There is no abdominal tenderness.  Musculoskeletal:     Cervical back: Normal range of motion and neck supple.     Right lower leg: No edema.     Left lower leg: No edema.  Skin:    General: Skin is warm and dry.  Neurological:     General: No focal deficit present.     Mental Status: He is alert and oriented to person, place, and time.  Psychiatric:        Mood and Affect: Mood is not anxious.         Behavior: Behavior normal.    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- - NYHA class II - euvolemic today - weighing daily; reminded to call for an overnight weight gain of > 2 pounds or a weekly weight gain of >5 pounds - weight down 5 pounds from last visit here 1 month ago - carvedilol 3.125mg  BID - jardiance 10mg  daily - furosemide 40mg  daily - entresto 97/103mg  BID - not adding salt and tries to review food labels - saw cardiology Marilynn Rail) on 10/03/21 - BMP today - would like to add MRA but unsure if he can manage getting the frequent labs drawn - BNP 07/06/21 was 160.6  2: HTN- - BP 128/97 - had virtual visit with PCP ([place]) 02/13/22 - BMP 05/12/22 reviewed and showed sodium 138, potassium 4.1, creatinine  1.21 and GFR >60   3: Obstructive sleep apnea-  - feels like he's sleeping better and says that he's been told that he's not snoring anymore - had repeat sleep study on 10/07/21  - saw cardiology Oval Linsey) 04/11/22; returns on 10/02/22  4: Diabetes mellitus- - A1C on 11/23/21 was 7.0% - metformin 500mg  BID   Return in 6 months, sooner if needed.

## 2022-10-02 ENCOUNTER — Ambulatory Visit (HOSPITAL_BASED_OUTPATIENT_CLINIC_OR_DEPARTMENT_OTHER): Payer: 59 | Admitting: Cardiovascular Disease

## 2022-10-02 NOTE — Progress Notes (Incomplete)
Cardiology Office Note:    Date:  10/02/2022   ID:  ADHVIK NANNA, DOB 1970/12/21, MRN [id number]  PCP:  de [place], Raymond J, MD  Cardiologist:  Skeet Latch, MD  Nephrologist:  Referring MD: [place], Blondell Reveal, MD   CC: Hypertension  History of Present Illness:    Kirk Lyons is a 52 y.o. male with a hx of hypertension, chronic heart failure, gout, and tobacco use, here for follow-up. He was initially seen 07/12/2021 in the [facility]. Previously he was in the ED 04/27/2021 after falling out of bed in the AM and striking his right-sided face on furniture. He reported having a CPAP but not wearing it, and that he frequently fell out of bed due to this. Kirk Lyons was seen in the ED 06/09/21 following 1 month of worsening shortness of breath and cough. He also reported lower middle chest and epigastric tightness, and orthopnea. He noted being out of all his medications for about a week. EKG showed SR, rate 99, iRBBB, LAFB and a QTc interval of 505 without other clear evidence of acute ischemia or significant arrhythmia. CXR revealed mild pulmonary edema. He was hypertensive on arrival with BP 191/100 (HR 101) on room air. Elevated troponin at 38 and 40 thought to be due to demand ischemia. He was given IV lasix and admission was recommended, but he declined. His symptoms improved and he was discharged with 20 mg Lasix for a few days and continuing his lisinopril. Lisinopril was later switched to [facility] by Ms. Hackney. He was last seen in Cardiology by Darylene Price, Leon[place] 07/06/21. His blood pressure at that visit was 145/106, HR 106. He reported a worsening cough since beginning Entresto, but did not wish to stop it as his breathing was improving significantly. He has not had a sleep study, but has been wearing a CPAP that reportedly self-regulates given to him by a friend. Also noted sleep eating and talking, and pain from his tongue pushing hard against his teeth.  Carvedilol 3.125 mg BID was started due to continued tachycardia. He is scheduled for an Echo on 07/20/21. A sleep study was scheduled for 07/21/21.   At his initial visit his blood pressures were much better controlled in general but he hadn't taken his medications that day. Kirk Lyons was added and he was referred to our Education officer, museum. He saw Kirk Memos, Kirk Lyons on 09/2021 and was feeling well. He was accidentally taking both Jardiance and Lasix twice a day. Blood pressure was 110/88. Today, he complains of excessive fatigue and daytime somnolence to the point of struggling to stay awake at work. He is having trouble treating his sleep apnea. He has a brand new CPAP machine but is unable to calibrate it. He saw a score of 24 for narcolepsy from his sleep study results. About a month and a half ago, he began having shortness of breath with minimal exertion so his dose for entresto was doubled. For exercise he walks a couple miles a day. He reports losing about 15 lbs since his last visit and about 4 lbs in the last month. At this time he has stopped consuming breads, pastas, and M&M's. He occasionally will eat a cheeseburger but has been mainly trying to incorporate healthier foods in his diet like salads and chicken breast. He avoids driving due to fear of falling asleep while driving. He denies any palpitations, or chest pain. No lightheadedness, headaches, syncope, orthopnea, or PND.   Previous antihypertensives: Amlodipine  Lisinopril  Past Medical History:  Diagnosis Date   CHF (congestive heart failure) (HCC)    Chronic combined systolic and diastolic heart failure ([place]) 07/12/2021   Chronic diastolic heart failure ([place]) 07/12/2021   Essential hypertension 07/12/2021   Gout    Heart failure, type unknown ([place]) 07/12/2021   Hypertension    Obesity (BMI 30.0-34.9) 07/12/2021   OSA (obstructive sleep apnea) 07/12/2021   Tobacco use 07/12/2021    Past Surgical History:  Procedure Laterality Date    HERNIA REPAIR      Current Medications: No outpatient medications have been marked as taking for the 10/02/22 encounter (Appointment) with Skeet Latch, MD.     Allergies:   Erythromycin   Social History   Socioeconomic History   Marital status: Single    Spouse name: Not on file   Number of children: Not on file   Years of education: Not on file   Highest education level: Not on file  Occupational History   Not on file  Tobacco Use   Smoking status: Former    Types: Cigarettes   Smokeless tobacco: Never   Tobacco comments:    May smoke a cigarette if he has a beer  Vaping Use   Vaping Use: Never used  Substance and Sexual Activity   Alcohol use: Not Currently    Comment: social   Drug use: No   Sexual activity: Not Currently  Other Topics Concern   Not on file  Social History Narrative   Not on file   Social Determinants of Health   Financial Resource Strain: High Risk (07/12/2021)   Overall Financial Resource Strain (CARDIA)    Difficulty of Paying Living Expenses: Very hard  Food Insecurity: No Food Insecurity (07/12/2021)   Hunger Vital Sign    Worried About Running Out of Food in the Last Year: Never true    Ran Out of Food in the Last Year: Never true  Transportation Needs: Unmet Transportation Needs (07/12/2021)   PRAPARE - Transportation    Lack of Transportation (Medical): No    Lack of Transportation (Non-Medical): Yes  Physical Activity: Unknown (07/12/2021)   Exercise Vital Sign    Days of Exercise per Week: 5 days    Minutes of Exercise per Session: Not on file  Stress: Stress Concern Present (07/12/2021)   [place]    Feeling of Stress : Very much  Social Connections: Socially Isolated (07/12/2021)   Social Connection and Isolation Panel [NHANES]    Frequency of Communication with Friends and Family: More than three times a week    Frequency of Social Gatherings with  Friends and Family: Once a week    Attends Religious Services: Never    Marine scientist or Organizations: No    Attends Music therapist: Never    Marital Status: Never married     Family History: The patient's family history includes Breast cancer in his mother; Coronary artery disease in his father; Hyperlipidemia in his father; Prostate cancer in his father; Testicular cancer in his brother.  ROS:   Please see the history of present illness.    (+) Daytime somnolence (+) Fatigue (+) Shortness of breath (+) Swelling in LE All other systems reviewed and are negative.  EKGs/Labs/Other Studies Reviewed:    DG CXR 06/09/2021: COMPARISON:  06/06/2020   FINDINGS: Cardiac shadow is enlarged increased from the prior exam. Lungs are well aerated bilaterally. Vascular congestion is seen  with minimal interstitial edema. No sizable effusion is noted. No bony abnormality is seen.   IMPRESSION: Changes consistent with mild CHF.  Echo 07/20/21:  1. Left ventricular ejection fraction, by estimation, is 45 to 50%. The  left ventricle has mildly decreased function. The left ventricle  demonstrates global hypokinesis. There is mild left ventricular  hypertrophy. Left ventricular diastolic parameters  are consistent with Grade II diastolic dysfunction (pseudonormalization).   2. Right ventricular systolic function is normal. The right ventricular  size is normal. Moderately increased right ventricular wall thickness.  Tricuspid regurgitation signal is inadequate for assessing PA pressure.   3. Left atrial size was mildly dilated.   4. Right atrial size was mildly dilated.   5. The mitral valve is grossly normal. Trivial mitral valve  regurgitation.   6. The aortic valve was not well visualized. Aortic valve regurgitation  is not visualized.   7. There is mild dilatation of the ascending aorta, measuring 38 mm.   EKG:  EKG is personally reviewed. 04/11/2022:  EKG was  not ordered. 07/12/2021: Sinus rhythm. Rate 96 bpm. iRBBB. Indeterminate axis, cannot rule out prior inferior infarct  Recent Labs: 04/11/2022: ALT 29 05/12/2022: Hemoglobin 17.3; Platelets 255 09/29/2022: BUN 17; Creatinine, Ser 1.06; Potassium 4.1; Sodium 139   Recent Lipid Panel    Component Value Date/Time   CHOL 202 (H) 04/11/2022 1558   TRIG 155 (H) 04/11/2022 1558   HDL 55 04/11/2022 1558   CHOLHDL 3.7 04/11/2022 1558   LDLCALC 120 (H) 04/11/2022 1558    Physical Exam:    VS:  There were no vitals taken for this visit. , BMI There is no height or weight on file to calculate BMI. GENERAL:  Well appearing HEENT: Pupils equal round and reactive, fundi not visualized, oral mucosa unremarkable NECK:  No jugular venous distention, waveform within normal limits, carotid upstroke brisk and symmetric, no bruits, no thyromegaly LUNGS:  Clear to auscultation bilaterally HEART:  RRR.  PMI not displaced or sustained,S1 and S2 within normal limits, no S3, no S4, no clicks, no rubs, no murmurs ABD:  Flat, positive bowel sounds normal in frequency in pitch, no bruits, no rebound, no guarding, no midline pulsatile mass, no hepatomegaly, no splenomegaly EXT:  2 plus pulses throughout, no edema, no cyanosis no clubbing SKIN:  No rashes no nodules NEURO:  Cranial nerves II through XII grossly intact, motor grossly intact throughout PSYCH:  Cognitively intact, oriented to person place and time   ASSESSMENT/PLAN:    No problem-specific Assessment & Plan notes found for this encounter.     Screening for Secondary Hypertension:     07/12/2021   10:00 AM  Causes  Drugs/Herbals Screened     - Comments limits sodium intake as best he can    Relevant Labs/Studies:    Latest Ref Rng & Units 09/29/2022   11:48 AM 05/12/2022    7:58 PM 04/11/2022    3:58 PM  Basic Labs  Sodium 135 - 145 mmol/L 139  138  141   Potassium 3.5 - 5.1 mmol/L 4.1  4.1  4.6   Creatinine 0.61 - 1.24 mg/dL 1.06   1.21  1.18     Disposition:    FU with Kirk C. Oval Linsey, MD, [facility] in 1 year.  Medication Adjustments/Labs and Tests Ordered: Current medicines are reviewed at length with the patient today.  Concerns regarding medicines are outlined above.   No orders of the defined types were placed in this encounter.  No  orders of the defined types were placed in this encounter.  I,Mathew Stumpf,acting as a Education administrator for Skeet Latch, MD.,have documented all relevant documentation on the behalf of Skeet Latch, MD,as directed by  Skeet Latch, MD while in the presence of Skeet Latch, MD.  I, [place] Oval Linsey, MD have reviewed all documentation for this visit.  The documentation of the exam, diagnosis, procedures, and orders on 10/02/2022 are all accurate and complete.   Waynetta Pean  10/02/2022 7:53 AM    [facility]

## 2022-10-03 ENCOUNTER — Telehealth: Payer: Self-pay | Admitting: Family

## 2022-10-03 NOTE — Telephone Encounter (Signed)
Patient Kirk Lyons stating he wanted someone to call him back about his Jardiance assistance paper work. He wants to know what plan he is on      Please call and advise   [phone]

## 2022-10-03 NOTE — Telephone Encounter (Signed)
Spoke with patient. Pt previously approved for jardiance pt assistance but hasn't used them or called for a refill in a long time. He is going to call Jardiance patient assistance and see if they will send refill or if he needs to renew. If they cannot send refill, he will call clinic and we will provide samples and help pt fill out new application. Pt agreeable, and verbalized understanding

## 2022-10-04 NOTE — Telephone Encounter (Signed)
Patient called and LVM stating that he still having trouble getting the Jardiance and needing someone to call him back to help him either get started back ion the medication or where he needs to call to get the medication     Please call and advise

## 2022-10-04 NOTE — Telephone Encounter (Signed)
Spoke with Otila Kluver. Patient is able to pick up samples of Jardiance and was told he will have a patient assistance form with it when he comes to pick it up.

## 2022-10-09 ENCOUNTER — Other Ambulatory Visit: Payer: Self-pay | Admitting: *Deleted

## 2022-10-09 ENCOUNTER — Other Ambulatory Visit (HOSPITAL_COMMUNITY): Payer: Self-pay

## 2022-10-09 ENCOUNTER — Telehealth: Payer: Self-pay | Admitting: *Deleted

## 2022-10-09 NOTE — Telephone Encounter (Signed)
Spoke with pt he needs patient assistance for Jardiance. I spoke with Ignatius Specking our patient advocate and she said she needed household size and income. Information routed to [facility] and she will follow up with pt.

## 2022-10-30 ENCOUNTER — Telehealth (HOSPITAL_COMMUNITY): Payer: Self-pay

## 2022-10-30 NOTE — Telephone Encounter (Signed)
Advanced Heart Failure Patient Advocate Encounter  Application for Jardiance faxed to [facility] on 10/26/2022. Application form attached to patient chart.  Burnell Blanks, CPhT Rx Patient Advocate Phone: [phone]

## 2022-10-30 NOTE — Telephone Encounter (Signed)
Advanced Heart Failure Patient Advocate Encounter  Patient was approved to receive Jardiance from [facility] Effective 10/26/2022 to 10/26/2023  Determination letter has been added to patient chart. Left voicemail for patient with update.  Burnell Blanks, CPhT Rx Patient Advocate Phone: [phone]

## 2022-10-31 ENCOUNTER — Telehealth: Payer: Self-pay | Admitting: *Deleted

## 2022-10-31 NOTE — Telephone Encounter (Signed)
error 

## 2022-11-16 ENCOUNTER — Ambulatory Visit (HOSPITAL_BASED_OUTPATIENT_CLINIC_OR_DEPARTMENT_OTHER): Payer: Self-pay | Admitting: Family Medicine

## 2022-12-03 IMAGING — DX DG FOOT COMPLETE 3+V*L*
3 series · 3 of 3 positions shown · non-contrast
Comparison: Left foot radiographs 11/15/2020

CLINICAL DATA: Left foot pain and swelling starting days prior.

EXAM:
LEFT FOOT - COMPLETE 3+ VIEW

[foot ap]
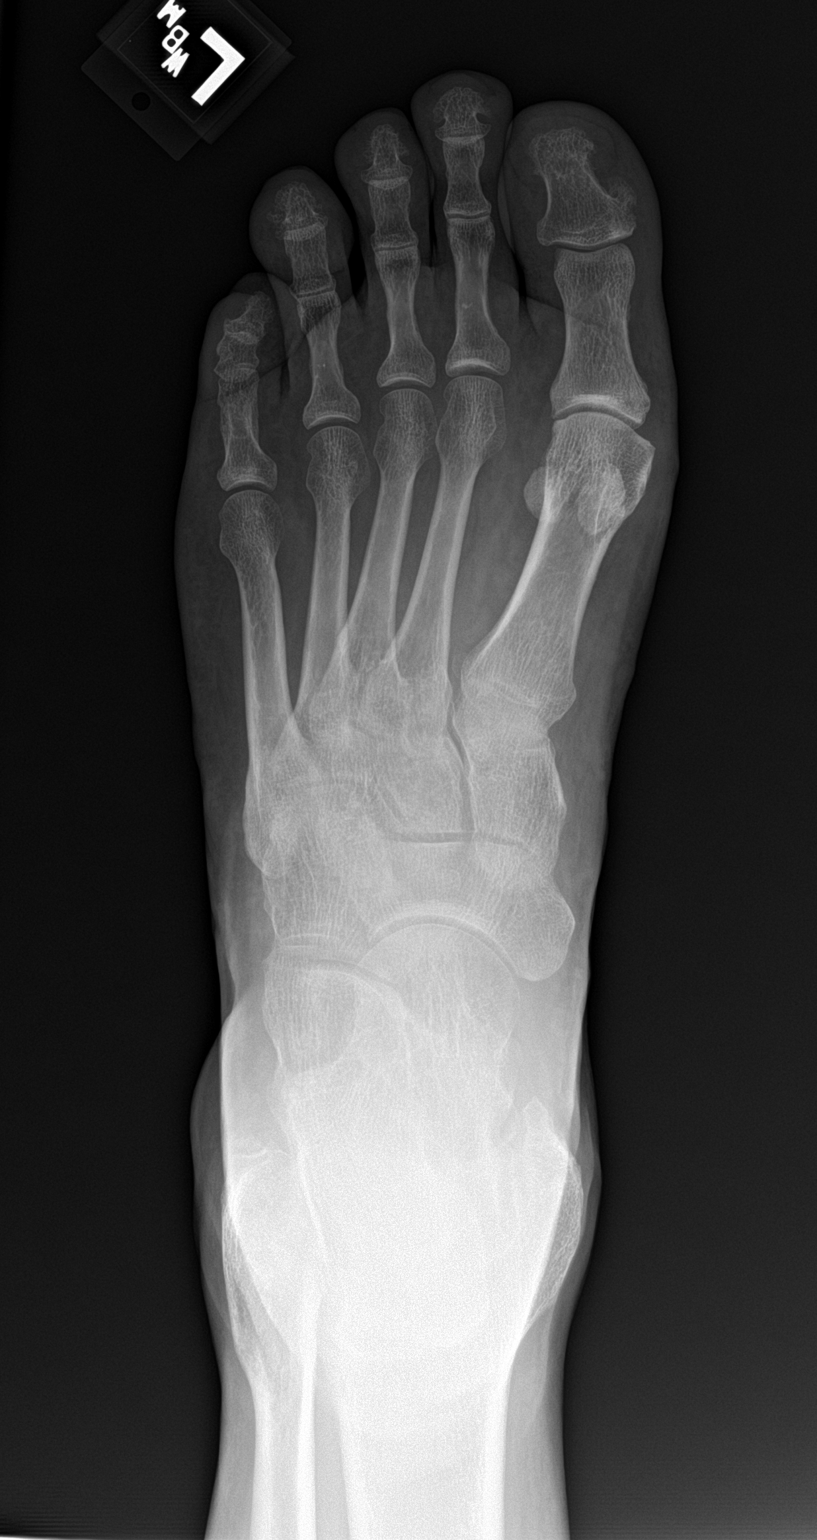

[foot obl]
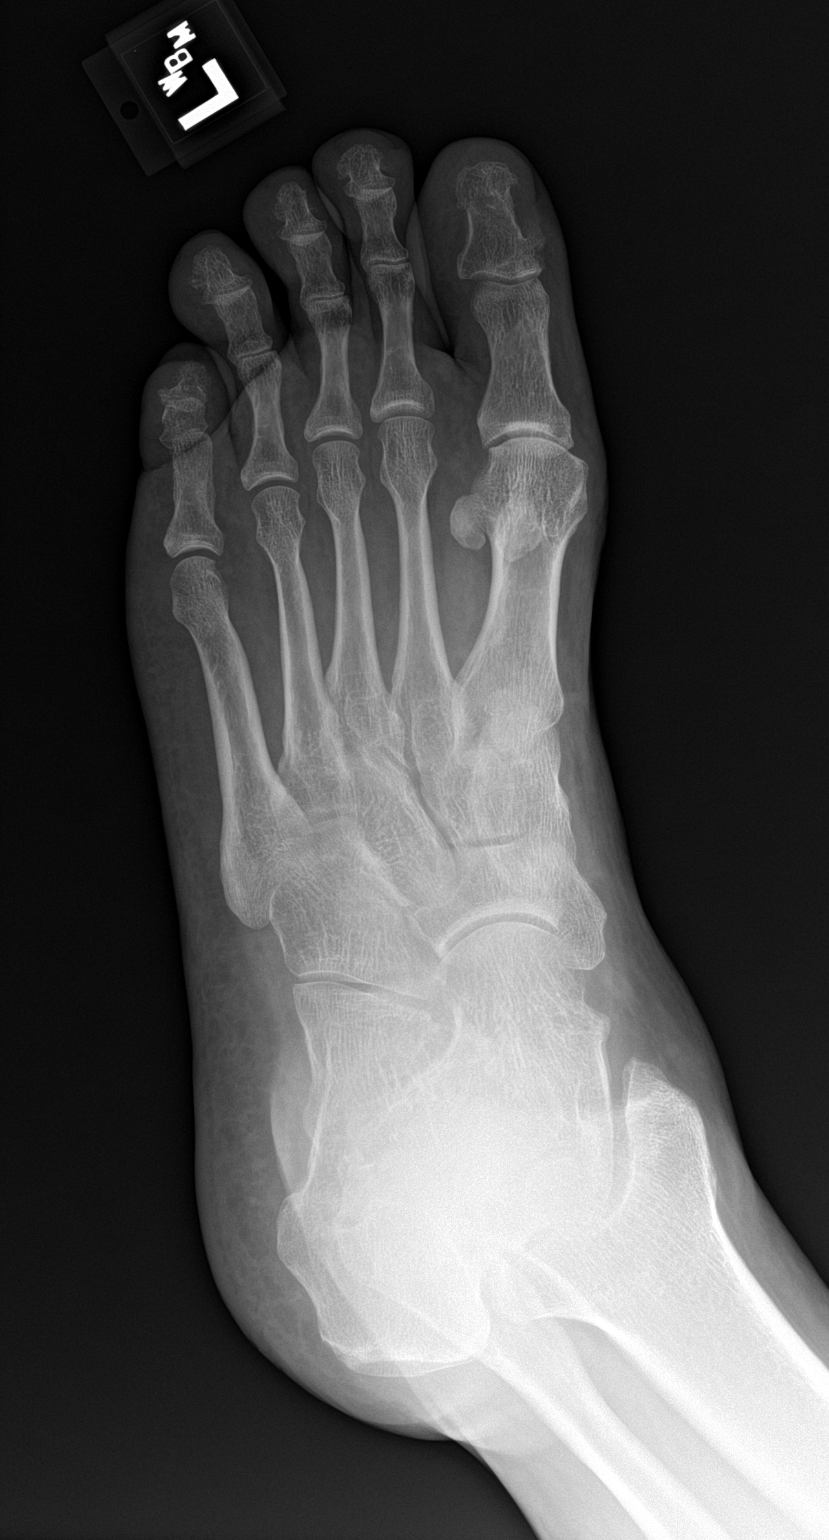

[foot lat]
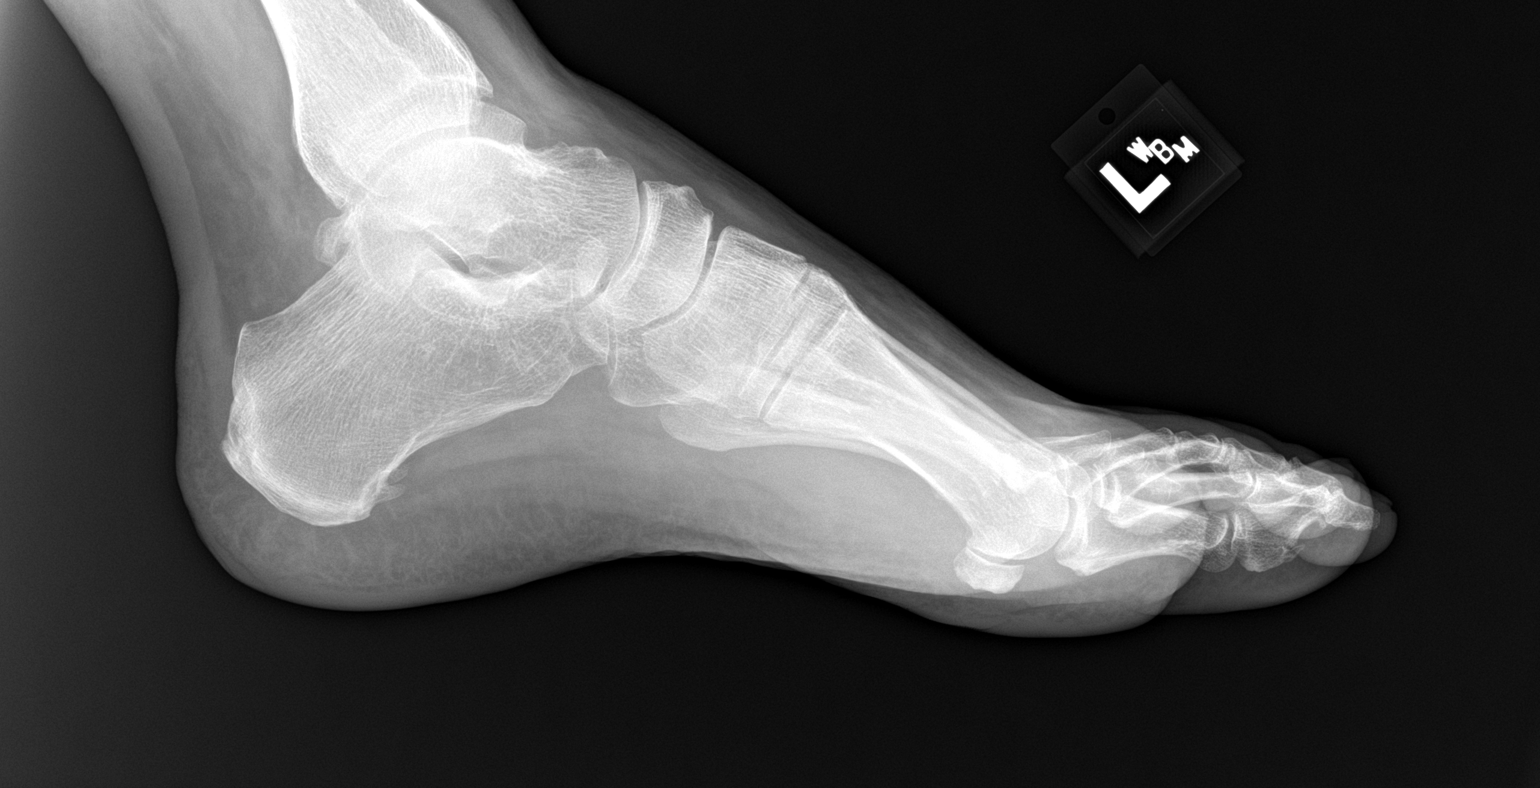

[3 of 3 positions shown; findings below may reference images not displayed]

FINDINGS: Mild lateral great toe metatarsophalangeal osteoarthritis. Mild
joint space narrowing of the second through fifth digit DIP joints
diffusely. Small plantar calcaneal heel spur. No acute fracture or
dislocation.
IMPRESSION: :
IMPRESSION: 1. Small plantar calcaneal heel spur.
2. Mild osteoarthritis of the great toe metatarsophalangeal joint
and other toe interphalangeal joints.

## 2022-12-18 ENCOUNTER — Telehealth (HOSPITAL_BASED_OUTPATIENT_CLINIC_OR_DEPARTMENT_OTHER): Payer: Self-pay | Admitting: Family Medicine

## 2022-12-20 ENCOUNTER — Encounter (HOSPITAL_BASED_OUTPATIENT_CLINIC_OR_DEPARTMENT_OTHER): Payer: Self-pay | Admitting: Family Medicine

## 2022-12-20 ENCOUNTER — Other Ambulatory Visit (HOSPITAL_BASED_OUTPATIENT_CLINIC_OR_DEPARTMENT_OTHER): Payer: Self-pay

## 2022-12-20 ENCOUNTER — Ambulatory Visit (INDEPENDENT_AMBULATORY_CARE_PROVIDER_SITE_OTHER): Payer: Self-pay | Admitting: Family Medicine

## 2022-12-20 VITALS — BP 148/110 | HR 95 | Ht 73.0 in | Wt 241.7 lb

## 2022-12-20 DIAGNOSIS — E1169 Type 2 diabetes mellitus with other specified complication: Secondary | ICD-10-CM | POA: Diagnosis not present

## 2022-12-20 DIAGNOSIS — G4733 Obstructive sleep apnea (adult) (pediatric): Secondary | ICD-10-CM

## 2022-12-20 DIAGNOSIS — E119 Type 2 diabetes mellitus without complications: Secondary | ICD-10-CM

## 2022-12-20 DIAGNOSIS — Z7984 Long term (current) use of oral hypoglycemic drugs: Secondary | ICD-10-CM

## 2022-12-20 DIAGNOSIS — I10 Essential (primary) hypertension: Secondary | ICD-10-CM

## 2022-12-20 LAB — POCT GLYCOSYLATED HEMOGLOBIN (HGB A1C): Hemoglobin A1C: 6.2 % — AB (ref 4.0–5.6)

## 2022-12-20 MED ORDER — CARVEDILOL 6.25 MG PO TABS
3.1250 mg | ORAL_TABLET | Freq: Two times a day (BID) | ORAL | 2 refills | Status: DC
Start: 2022-12-20 — End: 2022-12-28
  Filled 2022-12-20: qty 30, 30d supply, fill #0

## 2022-12-20 MED ORDER — CARVEDILOL 3.125 MG PO TABS
3.1250 mg | ORAL_TABLET | Freq: Two times a day (BID) | ORAL | 2 refills | Status: DC
  Filled 2022-12-20: qty 60, 30d supply, fill #0

## 2022-12-20 NOTE — Progress Notes (Signed)
w  Established Patient Office Visit  Subjective   Patient ID: Kirk Lyons, male    DOB: 1970-12-07  Age: 52 y.o. MRN: [id number]  Chief Complaint  Patient presents with   Tick Removal    Tick bite 3 weeks ago, tick may have been on him for about 24 hours. Middle of back, is very itchy, raised and red   Kirk Lyons is a 52 year old male patient who presents today for diabetes follow-up. Last in-office visit was 02/06/2022 to establish care. His A1c at that time was 7.0. Current medications include metformin 500mg  twice daily and Jardiance 10mg  daily. POCT A1c today 6.2.   He does have a glucose monitor at home but has not been checking blood sugars. He has made significant diet changes and reports losing about 30lbs. He has cut out candy/sweets, limits his portions, and is walking at least 6 times per week.   He has been diagnosed with OSA and does have a CPAP machine- but reports that he has not been able to have his settings adjusted since the sleep specialist provider did not want to adjust his personal machine. Therefore, he has been unable to wear his CPAP at night and get good rest at night.     Review of Systems  Constitutional:  Negative for malaise/fatigue.  Eyes:  Negative for blurred vision and double vision.  Respiratory:  Negative for cough and shortness of breath.   Cardiovascular:  Negative for chest pain, palpitations and leg swelling.  Gastrointestinal:  Negative for abdominal pain, nausea and vomiting.  Genitourinary:  Negative for frequency and urgency.  Musculoskeletal:  Negative for myalgias.  Neurological:  Negative for dizziness, weakness and headaches.  Endo/Heme/Allergies:  Negative for polydipsia.  Psychiatric/Behavioral:  Negative for depression and suicidal ideas. The patient is not nervous/anxious.       Objective:     BP (!) 148/110   Pulse 95   Ht 6\' 1"  (1.854 m)   Wt 241 lb 11.2 oz (109.6 kg)   SpO2 97%   BMI 31.89 kg/m  BP Readings from Last  3 Encounters:  12/20/22 (!) 148/110  09/29/22 (!) 128/97  08/08/22 (!) 157/117     Physical Exam Constitutional:      Appearance: Normal appearance.  Cardiovascular:     Rate and Rhythm: Normal rate and regular rhythm.     Pulses: Normal pulses.     Heart sounds: Normal heart sounds.  Pulmonary:     Effort: Pulmonary effort is normal.     Breath sounds: Normal breath sounds.  Musculoskeletal:     Right lower leg: No edema.     Left lower leg: No edema.  Neurological:     Mental Status: He is alert.  Psychiatric:        Mood and Affect: Mood normal.        Behavior: Behavior normal.       Assessment & Plan:   1. Type 2 diabetes mellitus without complication, without long-term current use of insulin [facility]) Patient presents today for diabetes follow-up. He is currently taking metformin 500mg  twice daily and Jardiance 10mg  daily as prescribed. POCT A1c performed today 6.2. Last A1c checked on 11/23/21 was 7.0. Congratulated patient on lifestyle modifications- including healthy diet and daily exercise. Advised him to continue with these lifestyle changes. Patient does not need refill of metformin or Jardiance today. Will continue with same medication regimen.  - POCT HgB A1C - Urine Microalbumin w/creat. Ratio  2. Essential  hypertension Patient presents today with elevated blood pressure. He is followed by heart failure clinic and . Patient in no acute distress and is well-appearing. Denies chest pain, shortness of breath, lower extremity edema, vision changes, headaches. Cardiovascular exam with heart regular rate and rhythm. Normal heart sounds, no murmurs present. No lower extremity edema present. Lungs clear to auscultation bilaterally. Patient is currently taking carvedilol 3.125mg  twice daily, lasix 40mg  daily, and Entresto 97-103mg  twice daily. Will adjust carvedilol to 6.25mg  twice daily due to elevated blood pressure in office. Advised patient to check blood pressure at home as  we make adjustment to his medications.   3. OSA (obstructive sleep apnea) Patient had sleep study performed on 10/07/2021. He reports that he was diagnosed with OSA but has been unable to have a sleep specialist adjust his CPAP machine settings. Patient reports that the provider wants him to purchase their machine, when he already has one at home. Patient reports that he recently lost his job and cannot afford this additional expense. Reached out to clinical social worker, Sammuel Hines, to assist with issues with CPAP settings and financial concerns.   4. Long term current use of oral hypoglycemic drug See #1  Return in about 3 months (around 03/22/2023) for Physical with fasting labs. Advised patient if he is unable to return for a physical, I would like to see him for diabetes follow-up.    Alyson Reedy, FNP

## 2022-12-21 LAB — MICROALBUMIN / CREATININE URINE RATIO
Creatinine, Urine: 136 mg/dL
Microalb/Creat Ratio: 296 mg/g creat — ABNORMAL HIGH (ref 0–29)
Microalbumin, Urine: 403.2 ug/mL

## 2022-12-25 ENCOUNTER — Telehealth: Payer: Self-pay

## 2022-12-25 ENCOUNTER — Telehealth (HOSPITAL_BASED_OUTPATIENT_CLINIC_OR_DEPARTMENT_OTHER): Payer: Self-pay | Admitting: Family Medicine

## 2022-12-25 NOTE — Telephone Encounter (Signed)
Patient wants explanation of lab work that just came up on [employer]

## 2022-12-25 NOTE — Telephone Encounter (Signed)
Spoke with patient over the phone about labs, gave a verbal understanding

## 2022-12-25 NOTE — Telephone Encounter (Signed)
  12/25/22     Pt called stating that he had a pcp appointment and had lab work done .  Pt said that he is worried because of the urine microalbumin with creat ration was elevated. Nurse instructed pt to call his ordered physician regarding this lab work results.  Pt stated that he is calling our clinic because he always feels more comfortable when Kirk Kindred, FNP talks to him regarding his treatment, labs and advice.   Pt stated that he wanted me to please have Kirk Lyons H,FNP advice  Will forward to [facility] for advice and Kirk Lyons, Kirk Song, FNP  to Me     12/25/22  3:20 PM He needs to contact his PCP for follow-up. Without having a baseline, we don't know if this is a new elevation or chronic. His PCP should recheck this level in a few months and if it's still elevated, he should probably see nephrology.   12/26/22 Pt aware, agreeable, and verbalized understanding .

## 2022-12-27 ENCOUNTER — Encounter (HOSPITAL_BASED_OUTPATIENT_CLINIC_OR_DEPARTMENT_OTHER): Payer: Self-pay

## 2022-12-27 ENCOUNTER — Telehealth (HOSPITAL_BASED_OUTPATIENT_CLINIC_OR_DEPARTMENT_OTHER): Payer: Self-pay | Admitting: Family Medicine

## 2022-12-27 NOTE — Telephone Encounter (Signed)
Spoke with Child psychotherapist, stated she will add him to her schedule for tomorrow if possible if not we will aim for next week. Called and let patient know this information, he was very understanding

## 2022-12-27 NOTE — Telephone Encounter (Signed)
Pt is calling asking about a Child psychotherapist suppose to call him.

## 2022-12-28 ENCOUNTER — Ambulatory Visit: Payer: Self-pay | Admitting: Licensed Clinical Social Worker

## 2022-12-28 ENCOUNTER — Other Ambulatory Visit (HOSPITAL_BASED_OUTPATIENT_CLINIC_OR_DEPARTMENT_OTHER): Payer: Self-pay

## 2022-12-28 ENCOUNTER — Telehealth (HOSPITAL_BASED_OUTPATIENT_CLINIC_OR_DEPARTMENT_OTHER): Payer: Self-pay | Admitting: Family Medicine

## 2022-12-28 DIAGNOSIS — E1169 Type 2 diabetes mellitus with other specified complication: Secondary | ICD-10-CM

## 2022-12-28 DIAGNOSIS — E7800 Pure hypercholesterolemia, unspecified: Secondary | ICD-10-CM

## 2022-12-28 DIAGNOSIS — I5042 Chronic combined systolic (congestive) and diastolic (congestive) heart failure: Secondary | ICD-10-CM

## 2022-12-28 DIAGNOSIS — I10 Essential (primary) hypertension: Secondary | ICD-10-CM

## 2022-12-28 MED ORDER — ROSUVASTATIN CALCIUM 40 MG PO TABS
40.0000 mg | ORAL_TABLET | Freq: Every day | ORAL | 3 refills | Status: DC
  Filled 2022-12-28 – 2023-05-22 (×4): qty 90, 90d supply, fill #0

## 2022-12-28 MED ORDER — CARVEDILOL 6.25 MG PO TABS
3.1250 mg | ORAL_TABLET | Freq: Two times a day (BID) | ORAL | 2 refills | Status: DC
  Filled 2022-12-28 – 2023-01-24 (×3): qty 30, 30d supply, fill #0
  Filled 2023-03-21: qty 30, 30d supply, fill #1

## 2022-12-28 MED ORDER — METFORMIN HCL 500 MG PO TABS
500.0000 mg | ORAL_TABLET | Freq: Two times a day (BID) | ORAL | 3 refills | Status: DC
  Filled 2022-12-28 – 2023-01-24 (×2): qty 60, 30d supply, fill #0
  Filled 2023-05-22: qty 60, 30d supply, fill #1

## 2022-12-28 NOTE — Telephone Encounter (Signed)
Medications have been sent in. 

## 2022-12-28 NOTE — Telephone Encounter (Signed)
Prescription Request  12/28/2022  LOV: 12/20/2022  What is the name of the medication or equipment?  Cardvedilol Metformin, Crestor,   Have you contacted your pharmacy to request a refill? Yes  Which pharmacy would you like this sent to? Please change ALL scripts to the  [facility]   Patient notified that their request is being sent to the clinical staff for review and that they should receive a response within 2 business days.   Please advise at Mobile [phone] (mobile)

## 2022-12-29 ENCOUNTER — Other Ambulatory Visit (HOSPITAL_BASED_OUTPATIENT_CLINIC_OR_DEPARTMENT_OTHER): Payer: Self-pay

## 2022-12-29 ENCOUNTER — Telehealth (HOSPITAL_BASED_OUTPATIENT_CLINIC_OR_DEPARTMENT_OTHER): Payer: Self-pay | Admitting: Family Medicine

## 2022-12-29 NOTE — Telephone Encounter (Signed)
  carvedilol (COREG) 6.25 MG tablet   Please call the pt regarding this medication

## 2022-12-29 NOTE — Patient Instructions (Signed)
Social Work Visit Information  Thank you for taking time to visit with me today. Please don't hesitate to contact me if I can be of assistance to you.   Following are the goals we discussed today:   Goals Addressed             This Visit's Progress    care coordination activities       Activities and task to complete in order to accomplish goals.   Call or go to Department of Social Services to apply for Medicaid and foods stamps per your request this information has been e-mailed Continue with compliance of taking medication prescribed by Doctor I will continue to explore options about you CPAP equipment   I have placed a referral with the pharmacy team they will contact you.  To discuss options to help you with obtaining your medication           No follow up scheduled with social work at this time. Will follow up in 7 to 10 days .Patient will call office if needed prior to next encounter.  Please call the care guide team at [phone] if you need to cancel or reschedule your appointment.   If you or anyone you know are experiencing a Mental Health or Behavioral Health Crisis or need someone to talk to, please call the Suicide and Crisis Lifeline: 988 call the [place] National Suicide Prevention Lifeline: (609)249-4434 or TTY: 513 562 6771 TTY 4450349134) to talk to a trained counselor call 1-800-273-TALK (toll free, 24 hour hotline) go to [facility] [address], [place] [phone])   Patient verbalizes understanding of instructions and care plan provided today and agrees to view in MyChart. Active MyChart status and patient understanding of how to access instructions and care plan via MyChart confirmed with patient.       Sammuel Hines, LCSW Social Work Care Coordination  [facility] Emmie Niemann [employer] [phone]

## 2022-12-29 NOTE — Patient Outreach (Signed)
  Care Coordination  Initial Visit Note   12/29/2022 Name: Kirk Lyons MRN: [id number] DOB: [birth year]  Kirk Lyons is a 52 y.o. year old male who sees Alyson Reedy, FNP for primary care. I spoke with  Kirk Lyons by phone today.  What matters to the patients health and wellness today?  Being able to use his CPAP machine, and getting insurance.    Goals Addressed             This Visit's Progress    care coordination activities       Activities and task to complete in order to accomplish goals.   Call or go to Department of Social Services to apply for Medicaid and foods stamps per your request this information has been e-mailed Continue with compliance of taking medication prescribed by Doctor I will continue to explore options about you CPAP equipment   I have placed a referral with the pharmacy team they will contact you.  To discuss options to help you with obtaining your medication          SDOH assessments and interventions completed:  Yes  SDOH Interventions Today    Flowsheet Row Most Recent Value  SDOH Interventions   Food Insecurity Interventions Other (Comment)  [shared information about food stamp program]  Financial Strain Interventions Other (Comment)  [DSS for SNAP application and Medicaid]       Care Coordination Interventions:  Yes, provided  Interventions Today    Flowsheet Row Most Recent Value  Chronic Disease   Chronic disease during today's visit Diabetes, Hypertension (HTN), Congestive Heart Failure (CHF)  General Interventions   General Interventions Discussed/Reviewed General Interventions Reviewed, Communication with, Durable Medical Equipment (DME)  [solution focus,  task centered,  problem solving, ]  Durable Medical Equipment (DME) Other  [CPAP machine tubing and mask]  Communication with PCP/Specialists  [[facility]]  Education Interventions   Education Provided Provided Education  Pharmacy Interventions   Pharmacy  Dicussed/Reviewed Pharmacy Topics Discussed, Referral to Pharmacist  Referral to Pharmacist Cannot afford medications       Follow up plan: Referral made to pharmacy No follow up scheduled will call patient after 1:00pm in 7 to 10 days to provide update on  resources discussed.    Encounter Outcome:  Pt. Visit Completed   Kirk Hines, LCSW Social Work Care Coordination  [facility] Emmie N[facility] [employer] [phone]

## 2023-01-01 ENCOUNTER — Telehealth: Payer: Self-pay | Admitting: Cardiovascular Disease

## 2023-01-01 NOTE — Telephone Encounter (Signed)
Spoke with patient, he received a Resmed Airsense 10 CPAP machine. He is in need of a prescription for accommodating supplies such as hose, mask. He will call back with device serial number so that I can register his CPAP machine with [employer]med for monitoring.

## 2023-01-01 NOTE — Telephone Encounter (Signed)
Spoke with patient regarding the way he was taking this medication, clarified the correct way and let him know if he needs a refill sooner to let us know.

## 2023-01-01 NOTE — Telephone Encounter (Signed)
Pt's social worker reaching out regarding pt's CPAP machine. She states pt has received CPAP but it has never been calibrated. She would like to know what the next steps are for the pt moving forward. Please advise.

## 2023-01-02 ENCOUNTER — Ambulatory Visit: Payer: Self-pay | Admitting: Licensed Clinical Social Worker

## 2023-01-02 NOTE — Patient Instructions (Signed)
Social Work Visit Information  Thank you for taking time to visit with me today. Please don't hesitate to contact me if I can be of assistance to you.   Following are the goals we discussed today:   Goals Addressed             This Visit's Progress    COMPLETED: care coordination activities       Activities and task to complete in order to accomplish goals.   Call or go to Department of Social Services to apply for Medicaid and foods stamps per your request this information has been e-mailed Continue with compliance of taking medication prescribed by Doctor Please follow up and provide the requested information from your CPAP equipment to Brandie   I have placed a referral with the pharmacy team they will contact you.  To discuss options to help you with obtaining your medication            No follow up scheduled with social work at this time. Patient will call office if needed.  Please call the care guide team at [phone] if you need to cancel or reschedule your appointment.   If you or anyone you know are experiencing a Mental Health or Behavioral Health Crisis or need someone to talk to, please call the Suicide and Crisis Lifeline: 988 call the [place] National Suicide Prevention Lifeline: (347)833-7721 or TTY: 9191904826 TTY 207-005-6610) to talk to a trained counselor call 1-800-273-TALK (toll free, 24 hour hotline) go to [facility] [address], [place] [phone])   Patient verbalizes understanding of instructions and care plan provided today and agrees to view in MyChart. Active MyChart status and patient understanding of how to access instructions and care plan via MyChart confirmed with patient.       Sammuel Hines, LCSW Social Work Care Coordination  [facility] [employer] [phone]

## 2023-01-02 NOTE — Patient Outreach (Signed)
  Care Coordination  Follow Up Visit Note   01/02/2023 Name: Kirk Lyons MRN: [id number] DOB: [birth year]  Kirk Lyons is a 52 y.o. year old male who sees Kirk Reedy, FNP for primary care. I spoke with  Kirk Lyons by phone today.  What matters to the patients health and wellness today?  Getting CPAP supplies  Patient has been connected to next steps for getting supplies for CPAP machine and calibration. Patient has also been advised to apply for Medicaid. Information provided.   Goals Addressed             This Visit's Progress    COMPLETED: care coordination activities       Activities and task to complete in order to accomplish goals.   Call or go to Department of Social Services to apply for Medicaid and foods stamps per your request this information has been e-mailed Continue with compliance of taking medication prescribed by Doctor Please follow up and provide the requested information from your CPAP equipment to Kirk Lyons   I have placed a referral with the pharmacy team they will contact you.  To discuss options to help you with obtaining your medication          SDOH assessments and interventions completed:  Yes  SDOH Interventions Today    Flowsheet Row Most Recent Value  SDOH Interventions   Transportation Interventions Intervention Not Indicated  Stress Interventions Other (Comment), Provide Counseling  [brief supportive intervention]       Care Coordination Interventions:  Yes, provided  Interventions Today    Flowsheet Row Most Recent Value  Chronic Disease   Chronic disease during today's visit Hypertension (HTN), Congestive Heart Failure (CHF), Diabetes  General Interventions   General Interventions Discussed/Reviewed Durable Medical Equipment (DME), General Interventions Reviewed, Communication with  Durable Medical Equipment (DME) Other  [CPAP supplies]  Communication with --  [care team for progression of CPAP barriers ( LCSW at Heart  failure clinic, Dr. Talmage Nap office, Cardiology office for sleep study clinic and [facility])]  Education Interventions   Education Provided Provided Education  Provided Verbal Education On Mental Health/Coping with Illness, Programmer, applications, Development worker, community  Mental Health Interventions   Mental Health Discussed/Reviewed Coping Strategies  [problem solving, solution focused and active listening]       Follow up plan:  No further intervention required by LCSW. Patient is not eligible for long term management services provided by the Care Coordination team Referral made to pharmacy team   Encounter Outcome:  Pt. Visit Completed   Kirk Hines, LCSW Social Work Care Coordination  [facility] Kirk Lyons [employer] [phone]

## 2023-01-03 ENCOUNTER — Telehealth: Payer: Self-pay

## 2023-01-03 NOTE — Progress Notes (Signed)
   Care Guide Note  01/03/2023 Name: Kirk Lyons MRN: [id number] DOB: May 03, 1971  Referred by: Alyson Reedy, FNP Reason for referral : Care Coordination (Outreach to schedule with Pharm d )   [name] is a 52 y.o. year old male who is a primary care patient of Alyson Reedy, FNP. [name] was referred to the pharmacist for assistance related to DM.    Successful contact was made with the patient to discuss pharmacy services. Patient declines engagement at this time. Contact information was provided to the patient should they wish to reach out for assistance at a later time.  Penne Lash, RMA Care Guide [facility]  [place], [place] [address] Direct Dial: [phone] [email or web address]

## 2023-01-04 ENCOUNTER — Telehealth (HOSPITAL_BASED_OUTPATIENT_CLINIC_OR_DEPARTMENT_OTHER): Payer: Self-pay | Admitting: Family Medicine

## 2023-01-04 ENCOUNTER — Telehealth (HOSPITAL_BASED_OUTPATIENT_CLINIC_OR_DEPARTMENT_OTHER): Payer: Self-pay | Admitting: Cardiovascular Disease

## 2023-01-04 ENCOUNTER — Ambulatory Visit (HOSPITAL_BASED_OUTPATIENT_CLINIC_OR_DEPARTMENT_OTHER): Payer: Self-pay

## 2023-01-04 NOTE — Telephone Encounter (Signed)
Patient calling the office for samples of medication:   1.  What medication and dosage are you requesting samples for? Entresto  2.  Are you currently out of this medication?  Yes- need some until his mail order comes in please

## 2023-01-04 NOTE — Telephone Encounter (Signed)
Will discuss with patient at appointment today, looks like cardiology follows this medication they may be able to help a little more with samples or patient assistance program than primary care.

## 2023-01-04 NOTE — Telephone Encounter (Signed)
Wants to know if any sample for nenetroso he's coming today for appointment

## 2023-01-05 NOTE — Telephone Encounter (Signed)
Do not sample  Entresto 97-103 mg tablets Ok per Dr Cristal Deer to give Entresto 49-51 mg and have him take 2 tablets twice a day  Left message to call back

## 2023-01-05 NOTE — Telephone Encounter (Signed)
Chilton Si, MD  to Brunetta Genera, [place]  Me     01/05/23 10:41 AM Who is his sleep medicine doctor?  They order sleep supplies.  TCR

## 2023-01-08 ENCOUNTER — Other Ambulatory Visit (HOSPITAL_BASED_OUTPATIENT_CLINIC_OR_DEPARTMENT_OTHER): Payer: Self-pay

## 2023-01-08 NOTE — Telephone Encounter (Signed)
Patient returned call, transferred from call center.   Patient did receive his prescription and will not be needing samples at this time.

## 2023-01-08 NOTE — Telephone Encounter (Signed)
2nd call attempt to patient, no answer, left message to call back.  

## 2023-01-15 DIAGNOSIS — Z419 Encounter for procedure for purposes other than remedying health state, unspecified: Secondary | ICD-10-CM | POA: Diagnosis not present

## 2023-01-24 ENCOUNTER — Other Ambulatory Visit: Payer: Self-pay | Admitting: *Deleted

## 2023-01-24 ENCOUNTER — Other Ambulatory Visit (HOSPITAL_BASED_OUTPATIENT_CLINIC_OR_DEPARTMENT_OTHER): Payer: Self-pay

## 2023-01-24 ENCOUNTER — Telehealth: Payer: Self-pay

## 2023-01-24 ENCOUNTER — Telehealth: Payer: Self-pay | Admitting: *Deleted

## 2023-01-24 DIAGNOSIS — Z1211 Encounter for screening for malignant neoplasm of colon: Secondary | ICD-10-CM

## 2023-01-24 NOTE — Telephone Encounter (Signed)
Pt called in upset because he has not received a call back. Pt wants to know if he can possibly get a referral to sleep studies here. Please advise.

## 2023-01-24 NOTE — Telephone Encounter (Addendum)
Pt called requesting for a new prescription for CPAP. CPAP 9 cm pressures.  Per Clarisa Kindred FNP, okay to sent prescription.   RN called [facility] at  # [phone] and spoke with rep. Shanda Bumps and Aurea Graff.   Dr.Notes and prescription was required.  RN spoke with patient to make him aware that Prescription for  CPAP supplies, equipment and calibration was faxed to  # provided by [facility] rep # [phone]  Pt aware, agreeable, and verbalized understanding and verbalizes gratitude towards Clarisa Kindred FNP for sending Prescription.

## 2023-01-24 NOTE — Telephone Encounter (Signed)
Returned call to patient who has been told he has sleep apnea.  Pt is in possession of a Resmed Airsense that was given to him by a rep. Approximately 6 yrs ago. 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  Jim Like MHA RN CCM.

## 2023-01-24 NOTE — Telephone Encounter (Signed)
I connected with Kirk Lyons  on 7/10 at 1432 by telephone and verified that I am speaking with the correct person using two identifiers. According to the patient's chart they are due for colon cancer screening. Referral placed for gastro for colonoscopy. Advised of options regarding cologuard and colonoscopy. Pt chose colonoscopy. There are no transportation issues at this time. Nothing further was needed at the end of our conversation.

## 2023-01-29 ENCOUNTER — Ambulatory Visit (HOSPITAL_BASED_OUTPATIENT_CLINIC_OR_DEPARTMENT_OTHER): Payer: Medicaid Other | Admitting: Family Medicine

## 2023-01-29 ENCOUNTER — Other Ambulatory Visit (HOSPITAL_BASED_OUTPATIENT_CLINIC_OR_DEPARTMENT_OTHER): Payer: Self-pay

## 2023-01-30 ENCOUNTER — Emergency Department (HOSPITAL_BASED_OUTPATIENT_CLINIC_OR_DEPARTMENT_OTHER)
Admission: EM | Admit: 2023-01-30 | Discharge: 2023-01-30 | Disposition: A | Discharge status: Home / Self Care | Payer: Medicaid Other | Attending: Emergency Medicine | Admitting: Emergency Medicine

## 2023-01-30 ENCOUNTER — Encounter (HOSPITAL_BASED_OUTPATIENT_CLINIC_OR_DEPARTMENT_OTHER): Payer: Self-pay

## 2023-01-30 ENCOUNTER — Telehealth: Payer: Self-pay

## 2023-01-30 ENCOUNTER — Other Ambulatory Visit: Payer: Self-pay

## 2023-01-30 DIAGNOSIS — I509 Heart failure, unspecified: Secondary | ICD-10-CM | POA: Diagnosis not present

## 2023-01-30 DIAGNOSIS — I110 Hypertensive heart disease with heart failure: Secondary | ICD-10-CM | POA: Diagnosis not present

## 2023-01-30 DIAGNOSIS — T7840XA Allergy, unspecified, initial encounter: Secondary | ICD-10-CM | POA: Insufficient documentation

## 2023-01-30 DIAGNOSIS — E119 Type 2 diabetes mellitus without complications: Secondary | ICD-10-CM | POA: Insufficient documentation

## 2023-01-30 LAB — CBC
HCT: 52.1 % — ABNORMAL HIGH (ref 39.0–52.0)
Hemoglobin: 17.9 g/dL — ABNORMAL HIGH (ref 13.0–17.0)
MCH: 33.2 pg (ref 26.0–34.0)
MCHC: 34.4 g/dL (ref 30.0–36.0)
MCV: 96.7 fL (ref 80.0–100.0)
Platelets: 252 10*3/uL (ref 150–400)
RBC: 5.39 MIL/uL (ref 4.22–5.81)
RDW: 12.6 % (ref 11.5–15.5)
WBC: 5.7 10*3/uL (ref 4.0–10.5)
nRBC: 0 % (ref 0.0–0.2)

## 2023-01-30 LAB — BASIC METABOLIC PANEL
Anion gap: 15 (ref 5–15)
BUN: 23 mg/dL — ABNORMAL HIGH (ref 6–20)
CO2: 19 mmol/L — ABNORMAL LOW (ref 22–32)
Calcium: 9.4 mg/dL (ref 8.9–10.3)
Chloride: 106 mmol/L (ref 98–111)
Creatinine, Ser: 1.49 mg/dL — ABNORMAL HIGH (ref 0.61–1.24)
GFR, Estimated: 56 mL/min — ABNORMAL LOW (ref >60)
Glucose, Bld: 153 mg/dL — ABNORMAL HIGH (ref 70–99)
Potassium: 4.1 mmol/L (ref 3.5–5.1)
Sodium: 140 mmol/L (ref 135–145)

## 2023-01-30 MED ORDER — FAMOTIDINE IN NACL 20-0.9 MG/50ML-% IV SOLN
20.0000 mg | Freq: Once | INTRAVENOUS | Status: AC
  Administered 2023-01-30: 20 mg via INTRAVENOUS
  Filled 2023-01-30: qty 50

## 2023-01-30 MED ORDER — SODIUM CHLORIDE 0.9 % IV BOLUS
1000.0000 mL | Freq: Once | INTRAVENOUS | Status: AC
  Administered 2023-01-30: 1000 mL via INTRAVENOUS

## 2023-01-30 MED ORDER — DIPHENHYDRAMINE HCL 50 MG/ML IJ SOLN
25.0000 mg | Freq: Once | INTRAMUSCULAR | Status: AC
  Administered 2023-01-30: 25 mg via INTRAVENOUS
  Filled 2023-01-30: qty 1

## 2023-01-30 MED ORDER — ONDANSETRON HCL 4 MG/2ML IJ SOLN
4.0000 mg | Freq: Once | INTRAMUSCULAR | Status: AC
  Administered 2023-01-30: 4 mg via INTRAVENOUS
  Filled 2023-01-30: qty 2

## 2023-01-30 MED ORDER — EPINEPHRINE 0.3 MG/0.3ML IJ SOAJ
0.3000 mg | Freq: Once | INTRAMUSCULAR | Status: AC
  Administered 2023-01-30: 0.3 mg via INTRAMUSCULAR
  Filled 2023-01-30: qty 0.3

## 2023-01-30 MED ORDER — PREDNISONE 10 MG PO TABS
30.0000 mg | ORAL_TABLET | Freq: Two times a day (BID) | ORAL | 0 refills | Status: AC
  Filled 2023-01-30: qty 24, 4d supply, fill #0

## 2023-01-30 MED ORDER — FAMOTIDINE 20 MG PO TABS
20.0000 mg | ORAL_TABLET | Freq: Two times a day (BID) | ORAL | 0 refills | Status: DC
  Filled 2023-01-30: qty 30, 15d supply, fill #0

## 2023-01-30 MED ORDER — METHYLPREDNISOLONE SODIUM SUCC 125 MG IJ SOLR
125.0000 mg | Freq: Once | INTRAMUSCULAR | Status: AC
  Administered 2023-01-30: 125 mg via INTRAVENOUS
  Filled 2023-01-30: qty 2

## 2023-01-30 MED ORDER — RACEPINEPHRINE HCL 2.25 % IN NEBU
0.5000 mL | INHALATION_SOLUTION | Freq: Once | RESPIRATORY_TRACT | Status: AC
  Administered 2023-01-30: 0.5 mL via RESPIRATORY_TRACT
  Filled 2023-01-30: qty 0.5

## 2023-01-30 MED ORDER — SODIUM CHLORIDE 0.9 % IV SOLN: INTRAVENOUS | Status: DC

## 2023-01-30 NOTE — ED Provider Notes (Signed)
[place] EMERGENCY DEPARTMENT AT [facility] Provider Note   CSN: [id number] Arrival date & time: 01/30/23  1633     History  Chief Complaint  Patient presents with   Allergic Reaction   HPI Kirk Lyons is a 52 y.o. male with history of CHF, hypertension, diabetes, hyperlipidemia presenting for allergic reaction.  States that about 45 minutes ago he was "attacked by yellow jackets or bees.  States he was stung multiple times in his face and feet and may be around his torso.  Denies shortness of breath and angioedema.  States he is "itching all over and feels hot".   Allergic Reaction      Home Medications Prior to Admission medications   Medication Sig Start Date End Date Taking? Authorizing Provider  famotidine (PEPCID) 20 MG tablet Take 1 tablet (20 mg total) by mouth 2 (two) times daily. 01/30/23  Yes Gareth Eagle, PA-C  predniSONE (DELTASONE) 10 MG tablet Take 3 tablets (30 mg total) by mouth 2 (two) times daily with a meal for 4 days. 01/30/23 02/03/23 Yes Gareth Eagle, PA-C  albuterol (VENTOLIN HFA) 108 (90 Base) MCG/ACT inhaler TAKE 2 PUFFS BY MOUTH EVERY 6 HOURS AS NEEDED FOR WHEEZE OR SHORTNESS OF BREATH 03/06/22   [place], Buren Kos, MD  carvedilol (COREG) 6.25 MG tablet Take 0.5 tablets (3.125 mg total) by mouth 2 (two) times daily. 12/28/22   Alyson Reedy, FNP  empagliflozin (JARDIANCE) 10 MG TABS tablet Take 1 tablet (10 mg total) by mouth daily. 10/03/21   Ronney Asters, NP  furosemide (LASIX) 40 MG tablet Take 1 tablet (40 mg total) by mouth daily. With additional 40mg  if needed 03/15/22   Delma Freeze, FNP  metFORMIN (GLUCOPHAGE) 500 MG tablet Take 1 tablet (500 mg total) by mouth 2 (two) times daily with a meal. 12/28/22   Alyson Reedy, FNP  Multiple Vitamins-Minerals (EMERGEN-C VITAMIN C) PACK Take 1 Package by mouth as needed (immune system support).    [provider]  rosuvastatin (CRESTOR) 40 MG tablet Take 1 tablet (40 mg  total) by mouth daily. 12/28/22 12/23/23  Alyson Reedy, FNP  sacubitril-valsartan (ENTRESTO) 97-103 MG Take 1 tablet by mouth 2 (two) times daily.    [provider]      Allergies    Erythromycin    Review of Systems   See HPI for pertinent positives   Physical Exam   Vitals:   01/30/23 1659 01/30/23 1714  BP:    Pulse: (!) 114   Resp: 14   Temp:    SpO2: 90% 95%    CONSTITUTIONAL:  well-appearing, distressed NEURO:  Alert and oriented x 3, CN 3-12 grossly intact EYES:  eyes equal and reactive, periorbital swelling noted ENT/NECK:  Supple, no stridor, generalized edema about the face.  Lips and tongue do not appear edematous CARDIO:  Tachycardic and regular rhythm, appears well-perfused  PULM:  No respiratory distress, CTAB GI/GU:  non-distended, soft MSK/SPINE:  No gross deformities, no edema, moves all extremities  SKIN:  no rash, atraumatic, urticarial rash noted about the feet and face, generalized erythema noted   *Additional and/or pertinent findings included in MDM below    ED Results / Procedures / Treatments   Labs (all labs ordered are listed, but only abnormal results are displayed) Labs Reviewed  BASIC METABOLIC PANEL - Abnormal; Notable for the following components:      Result Value   CO2 19 (*)    Glucose, Bld  153 (*)    BUN 23 (*)    Creatinine, Ser 1.49 (*)    GFR, Estimated 56 (*)    All other components within normal limits  CBC - Abnormal; Notable for the following components:   Hemoglobin 17.9 (*)    HCT 52.1 (*)    All other components within normal limits    EKG None  Radiology No results found.  Procedures Procedures    Medications Ordered in ED Medications  sodium chloride 0.9 % bolus 1,000 mL (0 mLs Intravenous Stopped 01/30/23 1918)    And  0.9 %  sodium chloride infusion ( Intravenous New Bag/Given 01/30/23 1729)  diphenhydrAMINE (BENADRYL) injection 25 mg (25 mg Intravenous Given 01/30/23 1648)  EPINEPHrine  (EPI-PEN) injection 0.3 mg (0.3 mg Intramuscular Given 01/30/23 1650)  ondansetron (ZOFRAN) injection 4 mg (4 mg Intravenous Given 01/30/23 1648)  methylPREDNISolone sodium succinate (SOLU-MEDROL) 125 mg/2 mL injection 125 mg (125 mg Intravenous Given 01/30/23 1649)  famotidine (PEPCID) IVPB 20 mg premix (0 mg Intravenous Stopped 01/30/23 1725)  Racepinephrine HCl 2.25 % nebulizer solution 0.5 mL (0.5 mLs Nebulization Given 01/30/23 1712)  diphenhydrAMINE (BENADRYL) injection 25 mg (25 mg Intravenous Given 01/30/23 1817)  famotidine (PEPCID) IVPB 20 mg premix (0 mg Intravenous Stopped 01/30/23 1918)    ED Course/ Medical Decision Making/ A&P                             Medical Decision Making Amount and/or Complexity of Data Reviewed Labs: ordered.  Risk OTC drugs. Prescription drug management.   52 year old well-appearing male presenting for allergic reaction.  Exam notable for generalized erythema, urticarial rash on the arms and feet and face, and tachycardia.  No stridor initially but did sound somewhat turbulent suggestive of upper airway inflammation.  Treated with racemic epi.  After treatment patient stated he felt "much better and was ready to leave".  Continued to deny that he ever had any trouble breathing or any shortness of breath.  Initially on 2 L and weaned to room air without any issues.  Vital stable at discharge.  Started him on a 5-day course of prednisone and Pepcid at home.  Discussed pertinent return precautions.  Discharged home.        Final Clinical Impression(s) / ED Diagnoses Final diagnoses:  Allergic reaction, initial encounter    Rx / DC Orders ED Discharge Orders          Ordered    predniSONE (DELTASONE) 10 MG tablet  2 times daily with meals        01/30/23 1933    famotidine (PEPCID) 20 MG tablet  2 times daily        01/30/23 1933              Gareth Eagle, PA-C 01/30/23 1935    Horton, Clabe Seal, DO 01/30/23 2348

## 2023-01-30 NOTE — ED Notes (Signed)
Patient removed from oxygen at this time per PA. SpO2 currently 98%.

## 2023-01-30 NOTE — Telephone Encounter (Signed)
Olivia from [facility] called to discuss cpap prescription. Requested revised presciption.   Zollie Scale states that because pt's sleep study is now over a year old, his insurance might refuse to cover his cpap prescription without a new sleep study, but she states they will still attempt.  She states she has discussed this with Monika Salk and that he is unwilling to have another sleep study. She informed Mr. Diiorio that if insurance refuses to cover the cpap, he would be responsible for the cost out of pocket.   Provider, [facility] notified. Revised prescription faxed per request.

## 2023-01-30 NOTE — ED Triage Notes (Signed)
Patient here POV from Home.  Endorses Swarm of Yellow Jackets/Bees attacking Patient less than 30 Minutes ago. Notes some SOB and Angioedema.   No Medications PTA.   Anxious in Triage. A&Ox4. GCS 15. Ambulatory.

## 2023-01-30 NOTE — Discharge Instructions (Signed)
Evaluation today revealed that you likely were having allergic reaction secondary to an insect sting.  Recommend you follow-up with your PCP and take prednisone and Pepcid for the next 5 days.  If you have any trouble breathing, shortness of breath, swelling in your face or mouth or any other concerning symptom please return emergency department further evaluation.

## 2023-01-31 ENCOUNTER — Telehealth (HOSPITAL_BASED_OUTPATIENT_CLINIC_OR_DEPARTMENT_OTHER): Payer: Self-pay | Admitting: Family Medicine

## 2023-01-31 ENCOUNTER — Other Ambulatory Visit (HOSPITAL_BASED_OUTPATIENT_CLINIC_OR_DEPARTMENT_OTHER): Payer: Self-pay

## 2023-01-31 ENCOUNTER — Other Ambulatory Visit: Payer: Self-pay

## 2023-01-31 NOTE — Telephone Encounter (Signed)
Patient calling for results of retina scan, please call patient with results

## 2023-02-01 NOTE — Telephone Encounter (Signed)
Spoke with patient, gave results

## 2023-02-05 ENCOUNTER — Ambulatory Visit (HOSPITAL_BASED_OUTPATIENT_CLINIC_OR_DEPARTMENT_OTHER): Payer: Medicaid Other | Admitting: Family Medicine

## 2023-02-05 NOTE — Progress Notes (Deleted)
Subjective:   Kirk Lyons Nov 23, 1970 02/05/2023  No chief complaint on file.   HPI: Kirk Lyons presents today for re-assessment and management of chronic medical conditions.   The following portions of the patient's history were reviewed and updated as appropriate: past medical history, past surgical history, family history, social history, allergies, medications, and problem list.   Patient Active Problem List   Diagnosis Date Noted   Pruritus 02/13/2022   Type 2 diabetes mellitus with other specified complication (HCC) 10/17/2021   Hypercholesterolemia 10/17/2021   Gout    Chronic combined systolic and diastolic heart failure (HCC) 07/12/2021   OSA (obstructive sleep apnea) 07/12/2021   Essential hypertension 07/12/2021   Obesity (BMI 30.0-34.9) 07/12/2021   Tobacco use 07/12/2021   Past Medical History:  Diagnosis Date   CHF (congestive heart failure) (HCC)    Chronic combined systolic and diastolic heart failure (HCC) 07/12/2021   Chronic diastolic heart failure (HCC) 07/12/2021   Essential hypertension 07/12/2021   Gout    Heart failure, type unknown (HCC) 07/12/2021   Hypertension    Obesity (BMI 30.0-34.9) 07/12/2021   OSA (obstructive sleep apnea) 07/12/2021   Tobacco use 07/12/2021   Past Surgical History:  Procedure Laterality Date   HERNIA REPAIR     Family History  Problem Relation Age of Onset   Breast cancer Mother    Hyperlipidemia Father    Coronary artery disease Father    Prostate cancer Father    Testicular cancer Brother    Outpatient Medications Prior to Visit  Medication Sig Dispense Refill   albuterol (VENTOLIN HFA) 108 (90 Base) MCG/ACT inhaler TAKE 2 PUFFS BY MOUTH EVERY 6 HOURS AS NEEDED FOR WHEEZE OR SHORTNESS OF BREATH 8.5 each 1   carvedilol (COREG) 6.25 MG tablet Take 0.5 tablets (3.125 mg total) by mouth 2 (two) times daily. 30 tablet 2   empagliflozin (JARDIANCE) 10 MG TABS tablet Take 1 tablet (10 mg total) by  mouth daily. 90 tablet 3   famotidine (PEPCID) 20 MG tablet Take 1 tablet (20 mg total) by mouth 2 (two) times daily. 30 tablet 0   furosemide (LASIX) 40 MG tablet Take 1 tablet (40 mg total) by mouth daily. With additional 40mg  if needed 100 tablet 3   metFORMIN (GLUCOPHAGE) 500 MG tablet Take 1 tablet (500 mg total) by mouth 2 (two) times daily with a meal. 60 tablet 3   Multiple Vitamins-Minerals (EMERGEN-C VITAMIN C) PACK Take 1 Package by mouth as needed (immune system support).     rosuvastatin (CRESTOR) 40 MG tablet Take 1 tablet (40 mg total) by mouth daily. 90 tablet 3   sacubitril-valsartan (ENTRESTO) 97-103 MG Take 1 tablet by mouth 2 (two) times daily.     No facility-administered medications prior to visit.   Allergies  Allergen Reactions   Erythromycin Nausea And Vomiting    vomiting vomiting     ROS: A complete ROS was performed with pertinent positives/negatives noted in the HPI. The remainder of the ROS are negative.    Objective:   There were no vitals filed for this visit.  Physical Exam          GENERAL: Well-appearing, in NAD. Well nourished.  SKIN: Pink, warm and dry. No rash, lesion, ulceration, or ecchymoses.  Head: Normocephalic. NECK: Trachea midline. Full ROM w/o pain or tenderness. No lymphadenopathy.  EARS: Tympanic membranes are intact, translucent without bulging and without drainage. Appropriate landmarks visualized.  EYES: Conjunctiva clear without exudates.  EOMI, PERRL, no drainage present.  NOSE: Septum midline w/o deformity. Nares patent, mucosa pink and non-inflamed w/o drainage. No sinus tenderness.  THROAT: Uvula midline. Oropharynx clear. Tonsils non-inflamed without exudate. Mucous membranes pink and moist.  RESPIRATORY: Chest wall symmetrical. Respirations even and non-labored. Breath sounds clear to auscultation bilaterally.  CARDIAC: S1, S2 present, regular rate and rhythm without murmur or gallops. Peripheral pulses 2+ bilaterally.   MSK: Muscle tone and strength appropriate for age. Joints w/o tenderness, redness, or swelling.  EXTREMITIES: Without clubbing, cyanosis, or edema.  NEUROLOGIC: No motor or sensory deficits. Steady, even gait. C2-C12 intact.  PSYCH/MENTAL STATUS: Alert, oriented x 3. Cooperative, appropriate mood and affect.   Health Maintenance Due  Topic Date Due   FOOT EXAM  Never done   HIV Screening  Never done   Hepatitis C Screening  Never done   Zoster Vaccines- Shingrix (1 of 2) Never done   Colonoscopy  Never done   COVID-19 Vaccine (2 - Pfizer risk series) 03/27/2020    No results found for any visits on 02/05/23.  The 10-year ASCVD risk score (Arnett DK, et al., 2019) is: 14.2%     Assessment & Plan:  *** There are no diagnoses linked to this encounter.  No orders of the defined types were placed in this encounter.  Lab Orders  No laboratory test(s) ordered today   No images are attached to the encounter or orders placed in the encounter.  No follow-ups on file.    Patient to reach out to office if new, worrisome, or unresolved symptoms arise or if no improvement in patient's condition. Patient verbalized understanding and is agreeable to treatment plan. All questions answered to patient's satisfaction.   Of note, portions of this note may have been created with voice recognition software Physicist, medical). While this note has been edited for accuracy, occasional wrong-word or 'sound-a-like' substitutions may have occurred due to the inherent limitations of voice recognition software.  Yolanda Manges, FNP

## 2023-02-15 DIAGNOSIS — G4733 Obstructive sleep apnea (adult) (pediatric): Secondary | ICD-10-CM | POA: Diagnosis not present

## 2023-02-15 DIAGNOSIS — Z419 Encounter for procedure for purposes other than remedying health state, unspecified: Secondary | ICD-10-CM | POA: Diagnosis not present

## 2023-03-12 ENCOUNTER — Other Ambulatory Visit (HOSPITAL_BASED_OUTPATIENT_CLINIC_OR_DEPARTMENT_OTHER): Payer: Medicaid Other

## 2023-03-18 DIAGNOSIS — Z419 Encounter for procedure for purposes other than remedying health state, unspecified: Secondary | ICD-10-CM | POA: Diagnosis not present

## 2023-03-18 DIAGNOSIS — G4733 Obstructive sleep apnea (adult) (pediatric): Secondary | ICD-10-CM | POA: Diagnosis not present

## 2023-03-19 ENCOUNTER — Emergency Department (HOSPITAL_BASED_OUTPATIENT_CLINIC_OR_DEPARTMENT_OTHER)
Admission: EM | Admit: 2023-03-19 | Discharge: 2023-03-19 | Disposition: A | Discharge status: Home / Self Care | Payer: Medicaid Other | Attending: Emergency Medicine | Admitting: Emergency Medicine

## 2023-03-19 ENCOUNTER — Other Ambulatory Visit: Payer: Self-pay

## 2023-03-19 ENCOUNTER — Encounter (HOSPITAL_BASED_OUTPATIENT_CLINIC_OR_DEPARTMENT_OTHER): Payer: Self-pay | Admitting: Emergency Medicine

## 2023-03-19 DIAGNOSIS — Z79899 Other long term (current) drug therapy: Secondary | ICD-10-CM | POA: Diagnosis not present

## 2023-03-19 DIAGNOSIS — M109 Gout, unspecified: Secondary | ICD-10-CM | POA: Diagnosis not present

## 2023-03-19 DIAGNOSIS — F17200 Nicotine dependence, unspecified, uncomplicated: Secondary | ICD-10-CM | POA: Diagnosis not present

## 2023-03-19 DIAGNOSIS — M25572 Pain in left ankle and joints of left foot: Secondary | ICD-10-CM | POA: Diagnosis present

## 2023-03-19 DIAGNOSIS — I5042 Chronic combined systolic (congestive) and diastolic (congestive) heart failure: Secondary | ICD-10-CM | POA: Diagnosis not present

## 2023-03-19 DIAGNOSIS — I110 Hypertensive heart disease with heart failure: Secondary | ICD-10-CM | POA: Insufficient documentation

## 2023-03-19 MED ORDER — OXYCODONE-ACETAMINOPHEN 5-325 MG PO TABS: 1.0000 | ORAL_TABLET | Freq: Four times a day (QID) | ORAL | 0 refills | Status: DC | PRN

## 2023-03-19 MED ORDER — INDOMETHACIN 25 MG PO CAPS
50.0000 mg | ORAL_CAPSULE | Freq: Once | ORAL | Status: AC
  Administered 2023-03-19: 50 mg via ORAL
  Filled 2023-03-19: qty 2

## 2023-03-19 MED ORDER — COLCHICINE 0.6 MG PO TABS
0.6000 mg | ORAL_TABLET | Freq: Once | ORAL | Status: DC
  Filled 2023-03-19: qty 1

## 2023-03-19 MED ORDER — PREDNISONE 50 MG PO TABS
60.0000 mg | ORAL_TABLET | Freq: Once | ORAL | Status: AC
  Administered 2023-03-19: 60 mg via ORAL
  Filled 2023-03-19: qty 1

## 2023-03-19 MED ORDER — INDOMETHACIN 50 MG PO CAPS: 50.0000 mg | ORAL_CAPSULE | Freq: Three times a day (TID) | ORAL | 0 refills | Status: AC

## 2023-03-19 MED ORDER — OXYCODONE-ACETAMINOPHEN 5-325 MG PO TABS
1.0000 | ORAL_TABLET | Freq: Once | ORAL | Status: AC
  Administered 2023-03-19: 1 via ORAL
  Filled 2023-03-19: qty 1

## 2023-03-19 MED ORDER — PREDNISONE 10 MG (21) PO TBPK: ORAL_TABLET | Freq: Every day | ORAL | 0 refills | Status: DC

## 2023-03-19 NOTE — ED Provider Notes (Incomplete)
[place] EMERGENCY DEPARTMENT AT [facility] Provider Note   CSN: [id number] Arrival date & time: 03/19/23  4098     History {Add pertinent medical, surgical, social history, OB history to HPI:1} Chief Complaint  Patient presents with   Gout    Kirk Lyons is a 52 y.o. male history of gout, CHF, hypertension presents today for evaluation of ankle pain.  Reports that he has had ankle pain for the last 2 days.  He has gout attack in the same location a couple years ago.  He denies any recent fall or trauma.  Denies any fever, nausea or vomiting.  Patient states that he used to take colchicine and allopurinol for his gout attack but he has run out of the medications.  HPI  Past Medical History:  Diagnosis Date   CHF (congestive heart failure) (HCC)    Chronic combined systolic and diastolic heart failure (HCC) 07/12/2021   Chronic diastolic heart failure (HCC) 07/12/2021   Essential hypertension 07/12/2021   Gout    Heart failure, type unknown (HCC) 07/12/2021   Hypertension    Obesity (BMI 30.0-34.9) 07/12/2021   OSA (obstructive sleep apnea) 07/12/2021   Tobacco use 07/12/2021   Past Surgical History:  Procedure Laterality Date   HERNIA REPAIR       Home Medications Prior to Admission medications   Medication Sig Start Date End Date Taking? Authorizing Provider  albuterol (VENTOLIN HFA) 108 (90 Base) MCG/ACT inhaler TAKE 2 PUFFS BY MOUTH EVERY 6 HOURS AS NEEDED FOR WHEEZE OR SHORTNESS OF BREATH 03/06/22   [place], Buren Kos, MD  carvedilol (COREG) 6.25 MG tablet Take 0.5 tablets (3.125 mg total) by mouth 2 (two) times daily. 12/28/22   Alyson Reedy, FNP  empagliflozin (JARDIANCE) 10 MG TABS tablet Take 1 tablet (10 mg total) by mouth daily. 10/03/21   Ronney Asters, NP  famotidine (PEPCID) 20 MG tablet Take 1 tablet (20 mg total) by mouth 2 (two) times daily. 01/30/23   Gareth Eagle, PA-C  furosemide (LASIX) 40 MG tablet Take 1 tablet (40 mg total) by  mouth daily. With additional 40mg  if needed 03/15/22   Delma Freeze, FNP  metFORMIN (GLUCOPHAGE) 500 MG tablet Take 1 tablet (500 mg total) by mouth 2 (two) times daily with a meal. 12/28/22   Alyson Reedy, FNP  Multiple Vitamins-Minerals (EMERGEN-C VITAMIN C) PACK Take 1 Package by mouth as needed (immune system support).    [provider]  rosuvastatin (CRESTOR) 40 MG tablet Take 1 tablet (40 mg total) by mouth daily. 12/28/22 12/23/23  Alyson Reedy, FNP  sacubitril-valsartan (ENTRESTO) 97-103 MG Take 1 tablet by mouth 2 (two) times daily.    [provider]      Allergies    Erythromycin    Review of Systems   Review of Systems Negative except as per HPI.  Physical Exam Updated Vital Signs BP 110/86 (BP Location: Left Arm)   Pulse (!) 106   Temp 98.5 F (36.9 C) (Oral)   Resp 16   Ht 6\' 1"  (1.854 m)   Wt 107.5 kg   SpO2 98%   BMI 31.27 kg/m  Physical Exam Vitals and nursing note reviewed.  Constitutional:      Appearance: Normal appearance.  HENT:     Head: Normocephalic and atraumatic.     Mouth/Throat:     Mouth: Mucous membranes are moist.  Eyes:     General: No scleral icterus. Cardiovascular:     Rate  and Rhythm: Normal rate and regular rhythm.     Pulses: Normal pulses.     Heart sounds: Normal heart sounds.  Pulmonary:     Effort: Pulmonary effort is normal.     Breath sounds: Normal breath sounds.  Abdominal:     General: Abdomen is flat.     Palpations: Abdomen is soft.     Tenderness: There is no abdominal tenderness.  Musculoskeletal:        General: No deformity.  Skin:    General: Skin is warm.     Findings: No rash.  Neurological:     General: No focal deficit present.     Mental Status: He is alert.  Psychiatric:        Mood and Affect: Mood normal.     ED Results / Procedures / Treatments   Labs (all labs ordered are listed, but only abnormal results are displayed) Labs Reviewed - No data to  display  EKG None  Radiology No results found.  Procedures Procedures  {Document cardiac monitor, telemetry assessment procedure when appropriate:1}  Medications Ordered in ED Medications - No data to display  ED Course/ Medical Decision Making/ A&P   {   Click here for ABCD2, HEART and other calculatorsREFRESH Note before signing :1}                              Medical Decision Making  This patient presents to the ED for ***, this involves an extensive number of treatment options, and is a complaint that carries with a high risk of complications and morbidity.  The differential diagnosis includes ***.  This is not an exhaustive list.  Lab tests: I ordered and personally interpreted labs.  The pertinent results include: WBC unremarkable. Hbg unremarkable. Platelets unremarkable. Electrolytes unremarkable. BUN, creatinine unremarkable. ***  Imaging studies: I ordered imaging studies, personally reviewed, interpreted imaging and agree with the radiologist's interpretations. The results include: ***   Problem list/ ED course/ Critical interventions/ Medical management: HPI: See above Vital signs ***within normal range and stable throughout visit. Laboratory/imaging studies significant for: See above. On physical examination, patient is afebrile and appears in no acute distress. *** I have reviewed the patient home medicines and have made adjustments as needed.  Cardiac monitoring/EKG: The patient was maintained on a cardiac monitor.  I personally reviewed and interpreted the cardiac monitor which showed an underlying rhythm of: sinus rhythm.  Additional history obtained: External records from outside source obtained and reviewed including: Chart review including previous notes, labs, imaging.  Consultations obtained:  Disposition Continued outpatient therapy. Follow-up with PCP recommended for reevaluation of symptoms. Treatment plan discussed with patient.  Pt  acknowledged understanding was agreeable to the plan. Worrisome signs and symptoms were discussed with patient, and patient acknowledged understanding to return to the ED if they noticed these signs and symptoms. Patient was stable upon discharge.   This chart was dictated using voice recognition software.  Despite best efforts to proofread,  errors can occur which can change the documentation meaning.    {Document critical care time when appropriate:1} {Document review of labs and clinical decision tools ie heart score, Chads2Vasc2 etc:1}  {Document your independent review of radiology images, and any outside records:1} {Document your discussion with family members, caretakers, and with consultants:1} {Document social determinants of health affecting pt's care:1} {Document your decision making why or why not admission, treatments were needed:1} Final Clinical Impression(s) / ED  Diagnoses Final diagnoses:  None    Rx / DC Orders ED Discharge Orders     None

## 2023-03-19 NOTE — Discharge Instructions (Addendum)
Please take your medications as prescribed. Take tylenol/ibuprofen/indomethacin or Percocet as needed for pain. I recommend close follow-up with PCP for reevaluation.  Please do not hesitate to return to emergency department if worrisome signs symptoms we discussed become apparent.

## 2023-03-19 NOTE — ED Triage Notes (Signed)
Pt arrives to ED with c/o gout attack to left ankle/foot x2 days.

## 2023-03-21 ENCOUNTER — Other Ambulatory Visit (HOSPITAL_BASED_OUTPATIENT_CLINIC_OR_DEPARTMENT_OTHER): Payer: Self-pay

## 2023-03-26 ENCOUNTER — Encounter (HOSPITAL_BASED_OUTPATIENT_CLINIC_OR_DEPARTMENT_OTHER): Payer: Medicaid Other | Admitting: Family Medicine

## 2023-03-29 ENCOUNTER — Encounter (HOSPITAL_BASED_OUTPATIENT_CLINIC_OR_DEPARTMENT_OTHER): Payer: Medicaid Other | Admitting: Family Medicine

## 2023-03-30 ENCOUNTER — Other Ambulatory Visit: Payer: Self-pay

## 2023-03-30 ENCOUNTER — Telehealth (HOSPITAL_BASED_OUTPATIENT_CLINIC_OR_DEPARTMENT_OTHER): Payer: Self-pay | Admitting: Family Medicine

## 2023-03-30 ENCOUNTER — Other Ambulatory Visit (HOSPITAL_BASED_OUTPATIENT_CLINIC_OR_DEPARTMENT_OTHER): Payer: Self-pay

## 2023-03-30 MED ORDER — CARVEDILOL 6.25 MG PO TABS
3.1250 mg | ORAL_TABLET | Freq: Two times a day (BID) | ORAL | 2 refills | Status: DC
  Filled 2023-04-10: qty 90, 90d supply, fill #0

## 2023-03-30 NOTE — Telephone Encounter (Signed)
Patient would like to get a referral for his colonoscopy

## 2023-03-30 NOTE — Telephone Encounter (Signed)
Will discuss at upcoming appt.

## 2023-04-02 ENCOUNTER — Encounter: Payer: Self-pay | Admitting: Family

## 2023-04-10 ENCOUNTER — Other Ambulatory Visit (HOSPITAL_BASED_OUTPATIENT_CLINIC_OR_DEPARTMENT_OTHER): Payer: Self-pay

## 2023-04-12 ENCOUNTER — Ambulatory Visit (HOSPITAL_BASED_OUTPATIENT_CLINIC_OR_DEPARTMENT_OTHER): Payer: Medicaid Other | Admitting: Cardiovascular Disease

## 2023-04-12 ENCOUNTER — Encounter (HOSPITAL_BASED_OUTPATIENT_CLINIC_OR_DEPARTMENT_OTHER): Payer: Medicaid Other | Admitting: Family Medicine

## 2023-04-12 NOTE — Progress Notes (Deleted)
PCP: Alyson Reedy, FNP (last seen 06/24) Primary Cardiologist: Edd Fabian, FNP (last seen 03/23)  HPI:  Kirk Lyons is a 52 y/o male with a history of HTN, gout, tobacco use and chronic heart failure.   Was in the ED 04/03/22 due to left ankle pain where he was evaluated and released. Was in the ED 05/12/22 due to gout where he was treated and released. Was in the ED 01/30/23 due to allergic reaction after multiple bee stings. Was in the ED 03/19/23 due to gout pain in his ankle.    Echo 07/20/21: EF 45 to 50%. The left ventricle has mildly decreased function. The left ventricle demonstrates global hypokinesis. The left ventricular internal cavity size was normal in size. There is mild left ventricular hypertrophy. Left ventricular diastolic parameters are consistent with Grade II diastolic dysfunction (pseudonormalization). Right Ventricle: The right ventricular size is normal. Moderately increased right ventricular wall thickness. Left Atrium: Left atrial size was mildly dilated. Right Atrium: Right atrial size was mildly dilated  He presents today for a HF follow-up visit with a chief complaint of .    ROS: All systems negative except as listed in HPI, PMH and Problem List.  SH:  Social History   Socioeconomic History   Marital status: Single    Spouse name: Not on file   Number of children: Not on file   Years of education: Not on file   Highest education level: Not on file  Occupational History   Not on file  Tobacco Use   Smoking status: Some Days    Types: Cigarettes   Smokeless tobacco: Never   Tobacco comments:    May smoke a cigarette if he has a beer  Vaping Use   Vaping status: Never Used  Substance and Sexual Activity   Alcohol use: Yes    Comment: social   Drug use: No   Sexual activity: Not Currently  Other Topics Concern   Not on file  Social History Narrative   Not on file   Social Determinants of Health   Financial Resource Strain: High Risk  (12/28/2022)   Overall Financial Resource Strain (CARDIA)    Difficulty of Paying Living Expenses: Very hard  Food Insecurity: Food Insecurity Present (12/28/2022)   Hunger Vital Sign    Worried About Running Out of Food in the Last Year: Sometimes true    Ran Out of Food in the Last Year: Sometimes true  Transportation Needs: No Transportation Needs (01/24/2023)   PRAPARE - Administrator, Civil Service (Medical): No    Lack of Transportation (Non-Medical): No  Recent Concern: Transportation Needs - Unmet Transportation Needs (01/02/2023)   PRAPARE - Transportation    Lack of Transportation (Medical): No    Lack of Transportation (Non-Medical): Yes  Physical Activity: Unknown (07/12/2021)   Exercise Vital Sign    Days of Exercise per Week: 5 days    Minutes of Exercise per Session: Not on file  Stress: Stress Concern Present (01/02/2023)   [employer] of Occupational Health - Occupational Stress Questionnaire    Feeling of Stress : Very much  Social Connections: Socially Isolated (07/12/2021)   Social Connection and Isolation Panel [NHANES]    Frequency of Communication with Friends and Family: More than three times a week    Frequency of Social Gatherings with Friends and Family: Once a week    Attends Religious Services: Never    Database administrator or Organizations: No    Attends  Club or Organization Meetings: Never    Marital Status: Never married  Intimate Partner Violence: Not At Risk (07/12/2021)   Humiliation, Afraid, Rape, and Kick questionnaire    Fear of Current or Ex-Partner: No    Emotionally Abused: No    Physically Abused: No    Sexually Abused: No    FH:  Family History  Problem Relation Age of Onset   Breast cancer Mother    Hyperlipidemia Father    Coronary artery disease Father    Prostate cancer Father    Testicular cancer Brother     Past Medical History:  Diagnosis Date   CHF (congestive heart failure) (HCC)    Chronic combined  systolic and diastolic heart failure (HCC) 07/12/2021   Chronic diastolic heart failure (HCC) 07/12/2021   Essential hypertension 07/12/2021   Gout    Heart failure, type unknown (HCC) 07/12/2021   Hypertension    Obesity (BMI 30.0-34.9) 07/12/2021   OSA (obstructive sleep apnea) 07/12/2021   Tobacco use 07/12/2021    Current Outpatient Medications  Medication Sig Dispense Refill   albuterol (VENTOLIN HFA) 108 (90 Base) MCG/ACT inhaler TAKE 2 PUFFS BY MOUTH EVERY 6 HOURS AS NEEDED FOR WHEEZE OR SHORTNESS OF BREATH 8.5 each 1   carvedilol (COREG) 6.25 MG tablet Take 0.5 tablets (3.125 mg total) by mouth 2 (two) times daily. 90 tablet 2   empagliflozin (JARDIANCE) 10 MG TABS tablet Take 1 tablet (10 mg total) by mouth daily. 90 tablet 3   famotidine (PEPCID) 20 MG tablet Take 1 tablet (20 mg total) by mouth 2 (two) times daily. 30 tablet 0   furosemide (LASIX) 40 MG tablet Take 1 tablet (40 mg total) by mouth daily. With additional 40mg  if needed 100 tablet 3   metFORMIN (GLUCOPHAGE) 500 MG tablet Take 1 tablet (500 mg total) by mouth 2 (two) times daily with a meal. 60 tablet 3   Multiple Vitamins-Minerals (EMERGEN-C VITAMIN C) PACK Take 1 Package by mouth as needed (immune system support).     oxyCODONE-acetaminophen (PERCOCET/ROXICET) 5-325 MG tablet Take 1 tablet by mouth every 6 (six) hours as needed for severe pain. 10 tablet 0   predniSONE (STERAPRED UNI-PAK 21 TAB) 10 MG (21) TBPK tablet Take by mouth daily. Take 6 tabs by mouth daily  for 2 days, then 5 tabs for 2 days, then 4 tabs for 2 days, then 3 tabs for 2 days, 2 tabs for 2 days, then 1 tab by mouth daily for 2 days 42 tablet 0   rosuvastatin (CRESTOR) 40 MG tablet Take 1 tablet (40 mg total) by mouth daily. 90 tablet 3   sacubitril-valsartan (ENTRESTO) 97-103 MG Take 1 tablet by mouth 2 (two) times daily.     No current facility-administered medications for this visit.      PHYSICAL EXAM:  General:  Well appearing. No  resp difficulty HEENT: normal Neck: supple. JVP flat. No lymphadenopathy or thryomegaly appreciated. Cor: PMI normal. Regular rate & rhythm. No rubs, gallops or murmurs. Lungs: clear Abdomen: soft, nontender, nondistended. No hepatosplenomegaly. No bruits or masses.  Extremities: no cyanosis, clubbing, rash, edema Neuro: alert & orientedx3, cranial nerves grossly intact. Moves all 4 extremities w/o difficulty. Affect pleasant.   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- - suspect due to sleep apnea - NYHA class II - euvolemic today - weighing daily; reminded to call for an overnight weight gain of > 2 pounds or a weekly  weight gain of >5 pounds - weight 240 pounds from last visit here 6+ months ago - continue carvedilol 3.125mg  BID - continue jardiance 10mg  daily - continue furosemide 40mg  daily - continue entresto 97/103mg  BID - not adding salt and tries to review food labels - saw cardiology Molli Hazard) 03/23 - would like to add MRA but unsure if he can manage getting the frequent labs drawn - BNP 07/06/21 was 160.6  2: HTN- - BP  - saw PCP Charm Barges) 06/24 - BMP 01/30/23 reviewed and showed sodium 140, potassium 4.1, creatinine 1.49 and GFR 56   3: Obstructive sleep apnea-  - feels like he's sleeping better and says that he's been told that he's not snoring anymore - had repeat sleep study on 10/07/21  - saw cardiology Duke Salvia) 09/23  4: Diabetes mellitus- - A1C on 12/20/22 was 6.2% - continue metformin 500mg  BID

## 2023-04-12 NOTE — Progress Notes (Deleted)
Cardiology Office Note:  .   Date:  04/12/2023  ID:  Kirk Lyons, DOB 1970/11/07, MRN [id number] PCP: Alyson Reedy, FNP  [facility] Providers Cardiologist:  Chilton Si, MD { Click to update primary MD,subspecialty MD or APP then REFRESH:1}   History of Present Illness: .   Kirk Lyons is a 52 y.o. male with a hx of hypertension, HFmrEF, OSA, hyperlipidemia, gout, and tobacco use, here for follow-up. He was initially seen 07/12/2021 in the [facility]. Previously he was in the ED 04/27/2021 after falling out of bed in the AM and striking his right-sided face on furniture. He reported having a CPAP but not wearing it, and that he frequently fell out of bed due to this. Kirk Lyons was seen in the ED 06/09/21 following 1 month of worsening shortness of breath and cough. He also reported lower middle chest and epigastric tightness, and orthopnea. He noted being out of all his medications for about a week. EKG showed SR, rate 99, iRBBB, LAFB and a QTc interval of 505 without other clear evidence of acute ischemia or significant arrhythmia. CXR revealed mild pulmonary edema. He was hypertensive on arrival with BP 191/100 (HR 101) on room air. Elevated troponin at 38 and 40 thought to be due to demand ischemia. He was given IV lasix and admission was recommended, but he declined. His symptoms improved and he was discharged with 20 mg Lasix for a few days and continuing his lisinopril. Lisinopril was later switched to [employer] by Ms. Hackney. He was last seen in Cardiology by Kirk Kindred, FNP 07/06/21. His blood pressure at that visit was 145/106, HR 106. He reported a worsening cough since beginning Entresto, but did not wish to stop it as his breathing was improving significantly. He has not had a sleep study, but has been wearing a CPAP that reportedly self-regulates given to him by a friend. Also noted sleep eating and talking, and pain from his tongue pushing hard  against his teeth. Carvedilol 3.125 mg BID was started due to continued tachycardia. He is scheduled for an Echo on 07/20/21. A sleep study was scheduled for 07/21/21.    At his initial visit his blood pressures were much better controlled in general but he hadn't taken his medications that day. Kirk Lyons was added and he was referred to our Child psychotherapist. He saw Kirk Fabian, NP on 09/2021 and was feeling well. He was accidentally taking both Jardiance and Lasix twice a day. Blood pressure was 110/88.  At his visit 03/2022 he was struggling with his new CPAP machine and was not using it regularly.  He made a lot of positive dietary changes.   ROS: ***  Studies Reviewed: .       Echo 07/2021: IMPRESSIONS     1. Left ventricular ejection fraction, by estimation, is 45 to 50%. The  left ventricle has mildly decreased function. The left ventricle  demonstrates global hypokinesis. There is mild left ventricular  hypertrophy. Left ventricular diastolic parameters  are consistent with Grade II diastolic dysfunction (pseudonormalization).   2. Right ventricular systolic function is normal. The right ventricular  size is normal. Moderately increased right ventricular wall thickness.  Tricuspid regurgitation signal is inadequate for assessing PA pressure.   3. Left atrial size was mildly dilated.   4. Right atrial size was mildly dilated.   5. The mitral valve is grossly normal. Trivial mitral valve  regurgitation.   6. The aortic valve was not well visualized.  Aortic valve regurgitation  is not visualized.   7. There is mild dilatation of the ascending aorta, measuring 38 mm.   Risk Assessment/Calculations:   {Does this patient have ATRIAL FIBRILLATION?[phone]} No BP recorded.  {Refresh Note OR Click here to enter BP  :1}***       Physical Exam:   VS:  There were no vitals taken for this visit.   Wt Readings from Last 3 Encounters:  03/19/23 237 lb (107.5 kg)  01/30/23 238 lb (108 kg)   12/20/22 241 lb 11.2 oz (109.6 kg)    GEN: Well nourished, well developed in no acute distress NECK: No JVD; No carotid bruits CARDIAC: ***RRR, no murmurs, rubs, gallops RESPIRATORY:  Clear to auscultation without rales, wheezing or rhonchi  ABDOMEN: Soft, non-tender, non-distended EXTREMITIES:  No edema; No deformity   ASSESSMENT AND PLAN: .   ***    {Are you ordering a CV Procedure (e.g. stress test, cath, DCCV, TEE, etc)?   Press F2        :[id number]}  Dispo: ***  Signed, Chilton Si, MD

## 2023-04-13 ENCOUNTER — Encounter: Payer: Self-pay | Admitting: Family

## 2023-04-13 ENCOUNTER — Telehealth: Payer: Self-pay | Admitting: Family

## 2023-04-13 NOTE — Telephone Encounter (Signed)
Patient did not show for his Heart Failure Clinic appointment on 04/13/23.

## 2023-04-17 DIAGNOSIS — Z419 Encounter for procedure for purposes other than remedying health state, unspecified: Secondary | ICD-10-CM | POA: Diagnosis not present

## 2023-04-17 DIAGNOSIS — G4733 Obstructive sleep apnea (adult) (pediatric): Secondary | ICD-10-CM | POA: Diagnosis not present

## 2023-04-18 ENCOUNTER — Encounter (HOSPITAL_BASED_OUTPATIENT_CLINIC_OR_DEPARTMENT_OTHER): Payer: Medicaid Other | Admitting: Family Medicine

## 2023-05-18 DIAGNOSIS — G4733 Obstructive sleep apnea (adult) (pediatric): Secondary | ICD-10-CM | POA: Diagnosis not present

## 2023-05-18 DIAGNOSIS — Z419 Encounter for procedure for purposes other than remedying health state, unspecified: Secondary | ICD-10-CM | POA: Diagnosis not present

## 2023-05-21 ENCOUNTER — Other Ambulatory Visit (HOSPITAL_BASED_OUTPATIENT_CLINIC_OR_DEPARTMENT_OTHER): Payer: Self-pay

## 2023-05-21 ENCOUNTER — Telehealth: Payer: Self-pay | Admitting: Family

## 2023-05-21 MED ORDER — CARVEDILOL 6.25 MG PO TABS
3.1250 mg | ORAL_TABLET | Freq: Two times a day (BID) | ORAL | 2 refills | Status: DC
  Filled 2023-05-21 – 2023-12-04 (×2): qty 90, 90d supply, fill #0

## 2023-05-21 MED ORDER — EMPAGLIFLOZIN 10 MG PO TABS
10.0000 mg | ORAL_TABLET | Freq: Every day | ORAL | 3 refills | Status: DC
  Filled 2023-05-21: qty 90, 90d supply, fill #0
  Filled 2023-12-04: qty 90, 90d supply, fill #1
  Filled 2024-04-17: qty 90, 90d supply, fill #2

## 2023-05-21 NOTE — Telephone Encounter (Signed)
Refills of carvedilol and jardiance sent per pt request. Pt has upcoming appt. 11/11.

## 2023-05-22 ENCOUNTER — Telehealth (HOSPITAL_BASED_OUTPATIENT_CLINIC_OR_DEPARTMENT_OTHER): Payer: Self-pay | Admitting: Family Medicine

## 2023-05-22 ENCOUNTER — Other Ambulatory Visit: Payer: Self-pay

## 2023-05-22 ENCOUNTER — Telehealth: Payer: Self-pay | Admitting: Cardiovascular Disease

## 2023-05-22 ENCOUNTER — Other Ambulatory Visit (HOSPITAL_BASED_OUTPATIENT_CLINIC_OR_DEPARTMENT_OTHER): Payer: Self-pay

## 2023-05-22 ENCOUNTER — Other Ambulatory Visit (HOSPITAL_COMMUNITY): Payer: Self-pay

## 2023-05-22 ENCOUNTER — Encounter (HOSPITAL_BASED_OUTPATIENT_CLINIC_OR_DEPARTMENT_OTHER): Payer: Self-pay

## 2023-05-22 ENCOUNTER — Telehealth (HOSPITAL_COMMUNITY): Payer: Self-pay

## 2023-05-22 MED ORDER — ALBUTEROL SULFATE HFA 108 (90 BASE) MCG/ACT IN AERS
2.0000 | INHALATION_SPRAY | Freq: Four times a day (QID) | RESPIRATORY_TRACT | 1 refills | Status: AC | PRN
  Filled 2023-05-22: qty 18, 30d supply, fill #0
  Filled 2023-12-04: qty 18, 30d supply, fill #1

## 2023-05-22 NOTE — Telephone Encounter (Signed)
Returned call to patient, unable to leave message. VM is full.

## 2023-05-22 NOTE — Telephone Encounter (Signed)
If patient returns call, medication has been sent to pharmacy downstairs. Will attempt another call!

## 2023-05-22 NOTE — Telephone Encounter (Signed)
PATIENT ASKING CAN YOU REFILL HIS ALBUTEROL. HE WOULD LIKE A CALL TO ADVISE HE NEEDS TODAY

## 2023-05-22 NOTE — Telephone Encounter (Signed)
Pt c/o medication issue:  1. Name of Medication: empagliflozin (JARDIANCE) 10 MG TABS tablet   2. How are you currently taking this medication (dosage and times per day)?    3. Are you having a reaction (difficulty breathing--STAT)? no  4. What is your medication issue? Calling to see if our office have any samples. Please advise

## 2023-05-22 NOTE — Telephone Encounter (Signed)
Attempted to reach patient, phone goes straight to voicemail. Voicemail is full, will send albuterol to pharmacy on file and attempt to call again.

## 2023-05-22 NOTE — Telephone Encounter (Signed)
Advanced Heart Failure Patient Advocate Encounter  Prior authorization for [name] has been submitted and approved. Test billing returns $4 for 90 day supply.  Key: [id number] Effective: 05/08/2023 to 05/21/2024  Burnell Blanks, CPhT Rx Patient Advocate Phone: [phone]

## 2023-05-22 NOTE — Telephone Encounter (Signed)
Patient came into office, message given & patient will go down to pharmacy to getmediction

## 2023-05-23 ENCOUNTER — Other Ambulatory Visit: Payer: Self-pay

## 2023-05-25 NOTE — Progress Notes (Deleted)
PCP: Alyson Reedy, FNP (last seen 06/24) Primary Cardiologist: Edd Fabian, NP (last seen 03/23)  HPI:  Kirk Lyons is a 52 y/o male with a history of HTN, gout, tobacco use and chronic heart failure.   Was in the ED 04/03/22 due to left ankle pain where he was evaluated and released. Was in the ED 05/12/22 due to gout where he was treated and released. Was in the ED 01/30/23 due to allergic reaction. Was in the ED 03/19/23 due to gout flare.   Echo 07/20/21: Left Ventricle: Left ventricular ejection fraction, by estimation, is 45 to 50%. The left ventricle has mildly decreased function. The left ventricle demonstrates global hypokinesis. The left ventricular internal cavity size was normal in size. There is mild left ventricular hypertrophy. Left ventricular diastolic parameters are consistent with Grade II diastolic dysfunction (pseudonormalization). Right Ventricle: The right ventricular size is normal. Moderately increased right ventricular wall thickness. Left Atrium: Left atrial size was mildly dilated. Right Atrium: Right atrial size was mildly dilated  He presents today for a HF follow-up visit with a chief complaint of     ROS: All systems negative except as listed in HPI, PMH and Problem List.  SH:  Social History   Socioeconomic History   Marital status: Single    Spouse name: Not on file   Number of children: Not on file   Years of education: Not on file   Highest education level: Not on file  Occupational History   Not on file  Tobacco Use   Smoking status: Some Days    Types: Cigarettes   Smokeless tobacco: Never   Tobacco comments:    May smoke a cigarette if he has a beer  Vaping Use   Vaping status: Never Used  Substance and Sexual Activity   Alcohol use: Yes    Comment: social   Drug use: No   Sexual activity: Not Currently  Other Topics Concern   Not on file  Social History Narrative   Not on file   Social Determinants of Health   Financial Resource  Strain: High Risk (12/28/2022)   Overall Financial Resource Strain (CARDIA)    Difficulty of Paying Living Expenses: Very hard  Food Insecurity: Food Insecurity Present (12/28/2022)   Hunger Vital Sign    Worried About Running Out of Food in the Last Year: Sometimes true    Ran Out of Food in the Last Year: Sometimes true  Transportation Needs: No Transportation Needs (01/24/2023)   PRAPARE - Administrator, Civil Service (Medical): No    Lack of Transportation (Non-Medical): No  Recent Concern: Transportation Needs - Unmet Transportation Needs (01/02/2023)   PRAPARE - Transportation    Lack of Transportation (Medical): No    Lack of Transportation (Non-Medical): Yes  Physical Activity: Unknown (07/12/2021)   Exercise Vital Sign    Days of Exercise per Week: 5 days    Minutes of Exercise per Session: Not on file  Stress: Stress Concern Present (01/02/2023)   [employer] of Occupational Health - Occupational Stress Questionnaire    Feeling of Stress : Very much  Social Connections: Socially Isolated (07/12/2021)   Social Connection and Isolation Panel [NHANES]    Frequency of Communication with Friends and Family: More than three times a week    Frequency of Social Gatherings with Friends and Family: Once a week    Attends Religious Services: Never    Database administrator or Organizations: No    Attends  Club or Organization Meetings: Never    Marital Status: Never married  Intimate Partner Violence: Not At Risk (07/12/2021)   Humiliation, Afraid, Rape, and Kick questionnaire    Fear of Current or Ex-Partner: No    Emotionally Abused: No    Physically Abused: No    Sexually Abused: No    FH:  Family History  Problem Relation Age of Onset   Breast cancer Mother    Hyperlipidemia Father    Coronary artery disease Father    Prostate cancer Father    Testicular cancer Brother     Past Medical History:  Diagnosis Date   CHF (congestive heart failure) (HCC)     Chronic combined systolic and diastolic heart failure (HCC) 07/12/2021   Chronic diastolic heart failure (HCC) 07/12/2021   Essential hypertension 07/12/2021   Gout    Heart failure, type unknown (HCC) 07/12/2021   Hypertension    Obesity (BMI 30.0-34.9) 07/12/2021   OSA (obstructive sleep apnea) 07/12/2021   Tobacco use 07/12/2021    Current Outpatient Medications  Medication Sig Dispense Refill   albuterol (VENTOLIN HFA) 108 (90 Base) MCG/ACT inhaler Inhale 2 puffs into the lungs every 6 (six) hours as needed for wheezing or shortness of breath. 18 g 1   carvedilol (COREG) 6.25 MG tablet Take 0.5 tablets (3.125 mg total) by mouth 2 (two) times daily. 90 tablet 2   empagliflozin (JARDIANCE) 10 MG TABS tablet Take 1 tablet (10 mg total) by mouth daily. 90 tablet 3   famotidine (PEPCID) 20 MG tablet Take 1 tablet (20 mg total) by mouth 2 (two) times daily. 30 tablet 0   furosemide (LASIX) 40 MG tablet Take 1 tablet (40 mg total) by mouth daily. With additional 40mg  if needed 100 tablet 3   metFORMIN (GLUCOPHAGE) 500 MG tablet Take 1 tablet (500 mg total) by mouth 2 (two) times daily with a meal. 60 tablet 3   Multiple Vitamins-Minerals (EMERGEN-C VITAMIN C) PACK Take 1 Package by mouth as needed (immune system support).     oxyCODONE-acetaminophen (PERCOCET/ROXICET) 5-325 MG tablet Take 1 tablet by mouth every 6 (six) hours as needed for severe pain. 10 tablet 0   predniSONE (STERAPRED UNI-PAK 21 TAB) 10 MG (21) TBPK tablet Take by mouth daily. Take 6 tabs by mouth daily  for 2 days, then 5 tabs for 2 days, then 4 tabs for 2 days, then 3 tabs for 2 days, 2 tabs for 2 days, then 1 tab by mouth daily for 2 days 42 tablet 0   rosuvastatin (CRESTOR) 40 MG tablet Take 1 tablet (40 mg total) by mouth daily. 90 tablet 3   sacubitril-valsartan (ENTRESTO) 97-103 MG Take 1 tablet by mouth 2 (two) times daily.     No current facility-administered medications for this visit.      PHYSICAL  EXAM:  General:  Well appearing. No resp difficulty HEENT: normal Neck: supple. JVP flat. No lymphadenopathy or thryomegaly appreciated. Cor: PMI normal. Regular rate & rhythm. No rubs, gallops or murmurs. Lungs: clear Abdomen: soft, nontender, nondistended. No hepatosplenomegaly. No bruits or masses. Extremities: no cyanosis, clubbing, rash, edema Neuro: alert & orientedx3, cranial nerves grossly intact. Moves all 4 extremities w/o difficulty. Affect pleasant.   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- - NYHA class II - euvolemic today - weighing daily; reminded to call for an overnight weight gain of > 2 pounds or a weekly weight gain of >5 pounds -  weight down 5 pounds from last visit here 1 month ago - continue carvedilol 3.125mg  BID - continue jardiance 10mg  daily - continue furosemide 40mg  daily - continue entresto 97/103mg  BID - not adding salt and tries to review food labels - saw cardiology Molli Hazard) on 03/23; returns 01/25 - BMP today - would like to add MRA but unsure if he can manage getting the frequent labs drawn - BNP 07/06/21 was 160.6  2: HTN- - BP  - saw PCP Charm Barges) 06/24 - BMP 01/30/23 reviewed and showed sodium 140, potassium 4.1, creatinine 1.49 and GFR 56   3: Obstructive sleep apnea-  - feels like he's sleeping better and says that he's been told that he's not snoring anymore - had repeat sleep study on 10/07/21  - saw cardiology Duke Salvia) 09/23  4: Diabetes mellitus- - A1C on 12/20/22 was 6.2% - metformin 500mg  BID

## 2023-05-28 ENCOUNTER — Encounter: Payer: Medicaid Other | Admitting: Family

## 2023-05-28 ENCOUNTER — Other Ambulatory Visit (HOSPITAL_BASED_OUTPATIENT_CLINIC_OR_DEPARTMENT_OTHER): Payer: Self-pay

## 2023-06-07 ENCOUNTER — Encounter (HOSPITAL_BASED_OUTPATIENT_CLINIC_OR_DEPARTMENT_OTHER): Payer: Self-pay | Admitting: Family Medicine

## 2023-06-17 DIAGNOSIS — Z419 Encounter for procedure for purposes other than remedying health state, unspecified: Secondary | ICD-10-CM | POA: Diagnosis not present

## 2023-06-17 DIAGNOSIS — G4733 Obstructive sleep apnea (adult) (pediatric): Secondary | ICD-10-CM | POA: Diagnosis not present

## 2023-06-19 ENCOUNTER — Telehealth: Payer: Self-pay | Admitting: Family

## 2023-06-19 NOTE — Telephone Encounter (Signed)
Pt confirmed appt for 06/20/23

## 2023-06-20 ENCOUNTER — Encounter: Payer: Medicaid Other | Admitting: Family

## 2023-06-20 NOTE — Telephone Encounter (Signed)
Patient did not show for his Heart Failure Clinic appointment on 06/20/23.

## 2023-06-27 NOTE — Progress Notes (Signed)
PCP: Alyson Reedy, FNP (last seen 06/24) Primary Cardiologist: Edd Fabian, NP (last seen 03/23)  HPI:  Mr Tonthat is a 52 y/o male with a history of HTN, gout, tobacco use and chronic heart failure.   Was in the ED 04/03/22 due to left ankle pain where he was evaluated and released. Was in the ED 05/12/22 due to gout where he was treated and released. Was in the ED 01/30/23 due to allergic reaction. Was in the ED 03/19/23 due to gout flare.   Echo 07/20/21: Left Ventricle: Left ventricular ejection fraction, by estimation, is 45 to 50%. The left ventricle has mildly decreased function. The left ventricle demonstrates global hypokinesis. The left ventricular internal cavity size was normal in size. There is mild left ventricular hypertrophy. Left ventricular diastolic parameters are consistent with Grade II diastolic dysfunction (pseudonormalization). Right Ventricle: The right ventricular size is normal. Moderately increased right ventricular wall thickness. Left Atrium: Left atrial size was mildly dilated. Right Atrium: Right atrial size was mildly dilated  He presents today for a HF follow-up visit with a chief complaint of minimal SOB with moderate exertion. Has associated dry cough along with this. Denies fatigue, chest pain, palpitations, abdominal distention, pedal edema, dizziness, weight gain or difficulty sleeping. Not taking furosemide daily due to frequent urination when he lays down at night. Does take furosemide on the weekends when he's not working.   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 Not wearing CPAP consistently. He says that when he got the new equipment, it "blew water in my nose" and then his mom died and he went out of state so he got out of the habit of wearing it.   ROS: All systems negative except as listed in HPI, PMH and Problem List.  SH:  Social History   Socioeconomic History   Marital  status: Single    Spouse name: Not on file   Number of children: Not on file   Years of education: Not on file   Highest education level: Not on file  Occupational History   Not on file  Tobacco Use   Smoking status: Some Days    Types: Cigarettes   Smokeless tobacco: Never   Tobacco comments:    May smoke a cigarette if he has a beer  Vaping Use   Vaping status: Never Used  Substance and Sexual Activity   Alcohol use: Yes    Comment: social   Drug use: No   Sexual activity: Not Currently  Other Topics Concern   Not on file  Social History Narrative   Not on file   Social Determinants of Health   Financial Resource Strain: High Risk (12/28/2022)   Overall Financial Resource Strain (CARDIA)    Difficulty of Paying Living Expenses: Very hard  Food Insecurity: Food Insecurity Present (12/28/2022)   Hunger Vital Sign    Worried About Running Out of Food in the Last Year: Sometimes true    Ran Out of Food in the Last Year: Sometimes true  Transportation Needs: No Transportation Needs (01/24/2023)   PRAPARE - Administrator, Civil Service (Medical): No    Lack of Transportation (Non-Medical): No  Recent Concern: Transportation Needs - Unmet Transportation Needs (01/02/2023)   PRAPARE - Transportation    Lack of Transportation (Medical): No    Lack of Transportation (Non-Medical): Yes  Physical Activity: Unknown (07/12/2021)   Exercise Vital  Sign    Days of Exercise per Week: 5 days    Minutes of Exercise per Session: Not on file  Stress: Stress Concern Present (01/02/2023)   [employer] of Occupational Health - Occupational Stress Questionnaire    Feeling of Stress : Very much  Social Connections: Socially Isolated (07/12/2021)   Social Connection and Isolation Panel [NHANES]    Frequency of Communication with Friends and Family: More than three times a week    Frequency of Social Gatherings with Friends and Family: Once a week    Attends Religious Services:  Never    Database administrator or Organizations: No    Attends Banker Meetings: Never    Marital Status: Never married  Intimate Partner Violence: Not At Risk (07/12/2021)   Humiliation, Afraid, Rape, and Kick questionnaire    Fear of Current or Ex-Partner: No    Emotionally Abused: No    Physically Abused: No    Sexually Abused: No    FH:  Family History  Problem Relation Age of Onset   Breast cancer Mother    Hyperlipidemia Father    Coronary artery disease Father    Prostate cancer Father    Testicular cancer Brother     Past Medical History:  Diagnosis Date   CHF (congestive heart failure) (HCC)    Chronic combined systolic and diastolic heart failure (HCC) 07/12/2021   Chronic diastolic heart failure (HCC) 07/12/2021   Essential hypertension 07/12/2021   Gout    Heart failure, type unknown (HCC) 07/12/2021   Hypertension    Obesity (BMI 30.0-34.9) 07/12/2021   OSA (obstructive sleep apnea) 07/12/2021   Tobacco use 07/12/2021    Current Outpatient Medications  Medication Sig Dispense Refill   albuterol (VENTOLIN HFA) 108 (90 Base) MCG/ACT inhaler Inhale 2 puffs into the lungs every 6 (six) hours as needed for wheezing or shortness of breath. 18 g 1   carvedilol (COREG) 6.25 MG tablet Take 0.5 tablets (3.125 mg total) by mouth 2 (two) times daily. 90 tablet 2   empagliflozin (JARDIANCE) 10 MG TABS tablet Take 1 tablet (10 mg total) by mouth daily. 90 tablet 3   famotidine (PEPCID) 20 MG tablet Take 1 tablet (20 mg total) by mouth 2 (two) times daily. 30 tablet 0   furosemide (LASIX) 40 MG tablet Take 1 tablet (40 mg total) by mouth daily. With additional 40mg  if needed 100 tablet 3   metFORMIN (GLUCOPHAGE) 500 MG tablet Take 1 tablet (500 mg total) by mouth 2 (two) times daily with a meal. 60 tablet 3   Multiple Vitamins-Minerals (EMERGEN-C VITAMIN C) PACK Take 1 Package by mouth as needed (immune system support).     oxyCODONE-acetaminophen  (PERCOCET/ROXICET) 5-325 MG tablet Take 1 tablet by mouth every 6 (six) hours as needed for severe pain. 10 tablet 0   predniSONE (STERAPRED UNI-PAK 21 TAB) 10 MG (21) TBPK tablet Take by mouth daily. Take 6 tabs by mouth daily  for 2 days, then 5 tabs for 2 days, then 4 tabs for 2 days, then 3 tabs for 2 days, 2 tabs for 2 days, then 1 tab by mouth daily for 2 days 42 tablet 0   rosuvastatin (CRESTOR) 40 MG tablet Take 1 tablet (40 mg total) by mouth daily. 90 tablet 3   sacubitril-valsartan (ENTRESTO) 97-103 MG Take 1 tablet by mouth 2 (two) times daily.     No current facility-administered medications for this visit.   Vitals:   06/29/23 1427  BP: (!) 141/91  Pulse: 80  SpO2: 98%  Weight: 245 lb (111.1 kg)   Wt Readings from Last 3 Encounters:  06/29/23 245 lb (111.1 kg)  03/19/23 237 lb (107.5 kg)  01/30/23 238 lb (108 kg)   Lab Results  Component Value Date   CREATININE 1.49 (H) 01/30/2023   CREATININE 1.06 09/29/2022   CREATININE 1.21 05/12/2022    PHYSICAL EXAM:  General:  Well appearing. No resp difficulty HEENT: normal Neck: supple. JVP flat. No lymphadenopathy or thryomegaly appreciated. Cor: PMI normal. Regular rate & rhythm. No rubs, gallops or murmurs. Lungs: clear Abdomen: soft, nontender, nondistended. No hepatosplenomegaly. No bruits or masses. Extremities: no cyanosis, clubbing, rash, edema Neuro: alert & oriented x3, cranial nerves grossly intact. Moves all 4 extremities w/o difficulty. Affect pleasant.   ECG: not done  ReDs: 44%   ASSESSMENT & PLAN:  1: NICM with minimally reduced ejection fraction- - suspect due to HTN/ untreated OSA - NYHA class II - euvolemic today - weighing daily; reminded to call for an overnight weight gain of > 2 pounds or a weekly weight gain of >5 pounds - weight up 5 pounds from last visit here 9 months ago - ReDs 44% - take 80mg  furosemide daily X 2 days & then take 20-40mg  daily after that - Echo 07/20/21: EF 45-50%  with mild LVH, Grade II DD, mild LAE, mild dilatation of ascending aorta at 38 mm - continue carvedilol 3.125mg  BID - continue jardiance 10mg  daily - continue entresto 97/103mg  BID - not adding salt and tries to review food labels - saw cardiology Molli Hazard) on 03/23; returns 01/25 - would like to add MRA but unsure if he can manage getting the frequent labs drawn - BNP 07/06/21 was 160.6 - proBNP today  2: HTN- - BP 141/91 - saw PCP Charm Barges) 06/24 - BMP 01/30/23 reviewed and showed sodium 140, potassium 4.1, creatinine 1.49 and GFR 56  - BMET today  3: Obstructive sleep apnea-  - not consistently wearing his CPAP because he went out of state when his mom died and got out of the habit of wearing it; emphasized the importance of resuming wearing it daily - had repeat sleep study on 10/07/21  - saw cardiology Duke Salvia) 09/23; he needs to call her office and schedule f/u appt  4: Diabetes mellitus- - A1C on 12/20/22 was 6.2% - continue metformin 500mg  BID - A1c today   Return in 2 weeks, sooner if needed. Repeat BMET at that time.

## 2023-06-28 ENCOUNTER — Telehealth: Payer: Self-pay | Admitting: Family

## 2023-06-28 NOTE — Telephone Encounter (Signed)
Pt confirmed appt for 06/29/23

## 2023-06-29 ENCOUNTER — Encounter: Payer: Self-pay | Admitting: Family

## 2023-06-29 ENCOUNTER — Other Ambulatory Visit (HOSPITAL_BASED_OUTPATIENT_CLINIC_OR_DEPARTMENT_OTHER): Payer: Self-pay

## 2023-06-29 ENCOUNTER — Other Ambulatory Visit
Admission: RE | Admit: 2023-06-29 | Discharge: 2023-06-29 | Disposition: A | Discharge status: Home / Self Care | Payer: Medicaid Other | Source: Ambulatory Visit | Attending: Family | Admitting: Family

## 2023-06-29 ENCOUNTER — Ambulatory Visit (HOSPITAL_BASED_OUTPATIENT_CLINIC_OR_DEPARTMENT_OTHER): Payer: Medicaid Other | Admitting: Family

## 2023-06-29 VITALS — BP 141/91 | HR 80 | Wt 245.0 lb

## 2023-06-29 DIAGNOSIS — M109 Gout, unspecified: Secondary | ICD-10-CM | POA: Insufficient documentation

## 2023-06-29 DIAGNOSIS — G4733 Obstructive sleep apnea (adult) (pediatric): Secondary | ICD-10-CM | POA: Diagnosis not present

## 2023-06-29 DIAGNOSIS — I110 Hypertensive heart disease with heart failure: Secondary | ICD-10-CM | POA: Diagnosis not present

## 2023-06-29 DIAGNOSIS — Z79899 Other long term (current) drug therapy: Secondary | ICD-10-CM | POA: Diagnosis not present

## 2023-06-29 DIAGNOSIS — I428 Other cardiomyopathies: Secondary | ICD-10-CM | POA: Diagnosis not present

## 2023-06-29 DIAGNOSIS — I10 Essential (primary) hypertension: Secondary | ICD-10-CM | POA: Diagnosis not present

## 2023-06-29 DIAGNOSIS — F17210 Nicotine dependence, cigarettes, uncomplicated: Secondary | ICD-10-CM | POA: Insufficient documentation

## 2023-06-29 DIAGNOSIS — E119 Type 2 diabetes mellitus without complications: Secondary | ICD-10-CM | POA: Diagnosis not present

## 2023-06-29 DIAGNOSIS — I5022 Chronic systolic (congestive) heart failure: Secondary | ICD-10-CM | POA: Diagnosis not present

## 2023-06-29 DIAGNOSIS — I5042 Chronic combined systolic (congestive) and diastolic (congestive) heart failure: Secondary | ICD-10-CM | POA: Insufficient documentation

## 2023-06-29 DIAGNOSIS — R350 Frequency of micturition: Secondary | ICD-10-CM | POA: Insufficient documentation

## 2023-06-29 DIAGNOSIS — Z7984 Long term (current) use of oral hypoglycemic drugs: Secondary | ICD-10-CM | POA: Diagnosis not present

## 2023-06-29 DIAGNOSIS — Z5986 Financial insecurity: Secondary | ICD-10-CM | POA: Diagnosis not present

## 2023-06-29 LAB — BASIC METABOLIC PANEL
Anion gap: 8 (ref 5–15)
BUN: 18 mg/dL (ref 6–20)
CO2: 26 mmol/L (ref 22–32)
Calcium: 8.9 mg/dL (ref 8.9–10.3)
Chloride: 105 mmol/L (ref 98–111)
Creatinine, Ser: 1.22 mg/dL (ref 0.61–1.24)
GFR, Estimated: >60 mL/min (ref >60)
Glucose, Bld: 108 mg/dL — ABNORMAL HIGH (ref 70–99)
Potassium: 4.9 mmol/L (ref 3.5–5.1)
Sodium: 139 mmol/L (ref 135–145)

## 2023-06-29 LAB — HEMOGLOBIN A1C
Hgb A1c MFr Bld: 6.3 % — ABNORMAL HIGH (ref 4.8–5.6)
Mean Plasma Glucose: 134.11 mg/dL

## 2023-06-29 MED ORDER — FUROSEMIDE 20 MG PO TABS
20.0000 mg | ORAL_TABLET | Freq: Every day | ORAL | 5 refills | Status: DC
  Filled 2023-06-29: qty 60, 30d supply, fill #0

## 2023-06-29 NOTE — Progress Notes (Signed)
ReDS Vest / Clip - 06/29/23 1400       ReDS Vest / Clip   Station Marker C    Ruler Value 29    ReDS Value Range High volume overload    ReDS Actual Value 44

## 2023-06-29 NOTE — Patient Instructions (Addendum)
TAKE 2 LASIX PILLS ONCE DAILY FOR THE NEXT 2 DAYS.  THEN, START TAKING  20-40 MG ONCE DAILY   Go DOWN to LOWER LEVEL (LL) to have your blood work completed inside of [employer] office.  We will only call you if the results are abnormal or if the provider would like to make medication changes.

## 2023-07-01 LAB — MISC LABCORP TEST (SEND OUT): Labcorp test code: 143000

## 2023-07-05 ENCOUNTER — Telehealth: Payer: Self-pay

## 2023-07-06 NOTE — Telephone Encounter (Signed)
Called and spoke with pt regarding Tina's advisory:  Go to 80mg  furosemide BID. Must call back Monday if not better and if symptoms worsen over the weekend, he needs to go to ER.   Pt had good understanding and no further questions.

## 2023-07-10 ENCOUNTER — Other Ambulatory Visit (HOSPITAL_BASED_OUTPATIENT_CLINIC_OR_DEPARTMENT_OTHER): Payer: Self-pay

## 2023-07-18 DIAGNOSIS — G4733 Obstructive sleep apnea (adult) (pediatric): Secondary | ICD-10-CM | POA: Diagnosis not present

## 2023-07-18 DIAGNOSIS — Z419 Encounter for procedure for purposes other than remedying health state, unspecified: Secondary | ICD-10-CM | POA: Diagnosis not present

## 2023-07-19 ENCOUNTER — Encounter: Payer: Medicaid Other | Admitting: Family

## 2023-07-19 NOTE — Progress Notes (Deleted)
 PCP: Towana Small, FNP (last seen 06/24) Primary Cardiologist: Emelia Hazy, NP (last seen 03/23)  HPI:  Kirk Lyons is a 53 y/o male with a history of HTN, gout, tobacco use and chronic heart failure.   Was in the ED 04/03/22 due to left ankle pain where he was evaluated and released. Was in the ED 05/12/22 due to gout where he was treated and released. Was in the ED 01/30/23 due to allergic reaction. Was in the ED 03/19/23 due to gout flare.   Echo 07/20/21: Left Ventricle: Left ventricular ejection fraction, by estimation, is 45 to 50%. The left ventricle has mildly decreased function. The left ventricle demonstrates global hypokinesis. The left ventricular internal cavity size was normal in size. There is mild left ventricular hypertrophy. Left ventricular diastolic parameters are consistent with Grade II diastolic dysfunction (pseudonormalization). Right Ventricle: The right ventricular size is normal. Moderately increased right ventricular wall thickness. Left Atrium: Left atrial size was mildly dilated. Right Atrium: Right atrial size was mildly dilated  He presents today for a HF follow-up visit with a chief complaint of minimal SOB with moderate exertion. Has associated dry cough along with this. Denies fatigue, chest pain, palpitations, abdominal distention, pedal edema, dizziness, weight gain or difficulty sleeping. Not taking furosemide  daily due to frequent urination when he lays down at night. Does take furosemide  on the weekends when he's not working.   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 Not wearing CPAP consistently. He says that when he got the new equipment, it blew water in my nose and then his mom died and he went out of state so he got out of the habit of wearing it.   ROS: All systems negative except as listed in HPI, PMH and Problem List.  SH:  Social History   Socioeconomic History   Marital  status: Single    Spouse name: Not on file   Number of children: Not on file   Years of education: Not on file   Highest education level: Not on file  Occupational History   Not on file  Tobacco Use   Smoking status: Some Days    Types: Cigarettes   Smokeless tobacco: Never   Tobacco comments:    May smoke a cigarette if he has a beer  Vaping Use   Vaping status: Never Used  Substance and Sexual Activity   Alcohol use: Yes    Comment: social   Drug use: No   Sexual activity: Not Currently  Other Topics Concern   Not on file  Social History Narrative   Not on file   Social Drivers of Health   Financial Resource Strain: High Risk (12/28/2022)   Overall Financial Resource Strain (CARDIA)    Difficulty of Paying Living Expenses: Very hard  Food Insecurity: Food Insecurity Present (12/28/2022)   Hunger Vital Sign    Worried About Running Out of Food in the Last Year: Sometimes true    Ran Out of Food in the Last Year: Sometimes true  Transportation Needs: No Transportation Needs (01/24/2023)   PRAPARE - Administrator, Civil Service (Medical): No    Lack of Transportation (Non-Medical): No  Recent Concern: Transportation Needs - Unmet Transportation Needs (01/02/2023)   PRAPARE - Transportation    Lack of Transportation (Medical): No    Lack of Transportation (Non-Medical): Yes  Physical Activity: Unknown (07/12/2021)   Exercise Vital  Sign    Days of Exercise per Week: 5 days    Minutes of Exercise per Session: Not on file  Stress: Stress Concern Present (01/02/2023)   [employer] of Occupational Health - Occupational Stress Questionnaire    Feeling of Stress : Very much  Social Connections: Socially Isolated (07/12/2021)   Social Connection and Isolation Panel [NHANES]    Frequency of Communication with Friends and Family: More than three times a week    Frequency of Social Gatherings with Friends and Family: Once a week    Attends Religious Services:  Never    Database Administrator or Organizations: No    Attends Banker Meetings: Never    Marital Status: Never married  Intimate Partner Violence: Not At Risk (07/12/2021)   Humiliation, Afraid, Rape, and Kick questionnaire    Fear of Current or Ex-Partner: No    Emotionally Abused: No    Physically Abused: No    Sexually Abused: No    FH:  Family History  Problem Relation Age of Onset   Breast cancer Mother    Hyperlipidemia Father    Coronary artery disease Father    Prostate cancer Father    Testicular cancer Brother     Past Medical History:  Diagnosis Date   CHF (congestive heart failure) (HCC)    Chronic combined systolic and diastolic heart failure (HCC) 07/12/2021   Chronic diastolic heart failure (HCC) 07/12/2021   Essential hypertension 07/12/2021   Gout    Heart failure, type unknown (HCC) 07/12/2021   Hypertension    Obesity (BMI 30.0-34.9) 07/12/2021   OSA (obstructive sleep apnea) 07/12/2021   Tobacco use 07/12/2021    Current Outpatient Medications  Medication Sig Dispense Refill   albuterol  (VENTOLIN  HFA) 108 (90 Base) MCG/ACT inhaler Inhale 2 puffs into the lungs every 6 (six) hours as needed for wheezing or shortness of breath. 18 g 1   carvedilol  (COREG ) 6.25 MG tablet Take 0.5 tablets (3.125 mg total) by mouth 2 (two) times daily. 90 tablet 2   empagliflozin  (JARDIANCE ) 10 MG TABS tablet Take 1 tablet (10 mg total) by mouth daily. 90 tablet 3   furosemide  (LASIX ) 20 MG tablet Take 1-2 tablets (20-40 mg total) by mouth daily. 60 tablet 5   furosemide  (LASIX ) 40 MG tablet Take 1 tablet (40 mg total) by mouth daily. With additional 40mg  if needed 100 tablet 3   metFORMIN  (GLUCOPHAGE ) 500 MG tablet Take 1 tablet (500 mg total) by mouth 2 (two) times daily with a meal. 60 tablet 3   Multiple Vitamins-Minerals (EMERGEN-C VITAMIN C) PACK Take 1 Package by mouth as needed (immune system support).     rosuvastatin  (CRESTOR ) 40 MG tablet Take 1  tablet (40 mg total) by mouth daily. 90 tablet 3   sacubitril -valsartan  (ENTRESTO ) 97-103 MG Take 1 tablet by mouth 2 (two) times daily.     No current facility-administered medications for this visit.   There were no vitals filed for this visit.  Wt Readings from Last 3 Encounters:  06/29/23 245 lb (111.1 kg)  03/19/23 237 lb (107.5 kg)  01/30/23 238 lb (108 kg)   Lab Results  Component Value Date   CREATININE 1.22 06/29/2023   CREATININE 1.49 (H) 01/30/2023   CREATININE 1.06 09/29/2022    PHYSICAL EXAM:  General:  Well appearing. No resp difficulty HEENT: normal Neck: supple. JVP flat. No lymphadenopathy or thryomegaly appreciated. Cor: PMI normal. Regular rate & rhythm. No rubs, gallops or murmurs.  Lungs: clear Abdomen: soft, nontender, nondistended. No hepatosplenomegaly. No bruits or masses. Extremities: no cyanosis, clubbing, rash, edema Neuro: alert & oriented x3, cranial nerves grossly intact. Moves all 4 extremities w/o difficulty. Affect pleasant.   ECG: not done  ReDs: 44%   ASSESSMENT & PLAN:  1: NICM with minimally reduced ejection fraction- - suspect due to HTN/ untreated OSA - NYHA class II - euvolemic today - weighing daily; reminded to call for an overnight weight gain of > 2 pounds or a weekly weight gain of >5 pounds - weight up 5 pounds from last visit here 9 months ago - ReDs 44% - take 80mg  furosemide  daily X 2 days & then take 20-40mg  daily after that - Echo 07/20/21: EF 45-50% with mild LVH, Grade II DD, mild LAE, mild dilatation of ascending aorta at 38 mm - continue carvedilol  3.125mg  BID - continue jardiance  10mg  daily - continue entresto  97/103mg  BID - not adding salt and tries to review food labels - saw cardiology Kirk Lyons) on 03/23; returns 01/25 - would like to add MRA but unsure if he can manage getting the frequent labs drawn - BNP 07/06/21 was 160.6 - proBNP today  2: HTN- - BP 141/91 - saw PCP Kirk Lyons) 06/24 - BMP 01/30/23  reviewed and showed sodium 140, potassium 4.1, creatinine 1.49 and GFR 56  - BMET today  3: Obstructive sleep apnea-  - not consistently wearing his CPAP because he went out of state when his mom died and got out of the habit of wearing it; emphasized the importance of resuming wearing it daily - had repeat sleep study on 10/07/21  - saw cardiology Kirk Lyons) 09/23; he needs to call her office and schedule f/u appt  4: Diabetes mellitus- - A1C on 12/20/22 was 6.2% - continue metformin  500mg  BID - A1c today   Return in 2 weeks, sooner if needed. Repeat BMET at that time.

## 2023-08-06 ENCOUNTER — Ambulatory Visit (HOSPITAL_BASED_OUTPATIENT_CLINIC_OR_DEPARTMENT_OTHER): Payer: Medicaid Other | Admitting: Family

## 2023-08-18 DIAGNOSIS — Z419 Encounter for procedure for purposes other than remedying health state, unspecified: Secondary | ICD-10-CM | POA: Diagnosis not present

## 2023-08-28 ENCOUNTER — Telehealth: Payer: Self-pay | Admitting: Family

## 2023-08-28 ENCOUNTER — Other Ambulatory Visit (HOSPITAL_BASED_OUTPATIENT_CLINIC_OR_DEPARTMENT_OTHER): Payer: Self-pay

## 2023-08-28 ENCOUNTER — Other Ambulatory Visit: Payer: Self-pay

## 2023-08-28 MED ORDER — SACUBITRIL-VALSARTAN 97-103 MG PO TABS
1.0000 | ORAL_TABLET | Freq: Two times a day (BID) | ORAL | 2 refills | Status: DC
Start: 2023-08-28 — End: 2024-04-25
  Filled 2023-08-28 – 2023-10-22 (×3): qty 180, 90d supply, fill #0
  Filled 2024-04-17 (×2): qty 180, 90d supply, fill #1

## 2023-09-07 ENCOUNTER — Other Ambulatory Visit (HOSPITAL_BASED_OUTPATIENT_CLINIC_OR_DEPARTMENT_OTHER): Payer: Self-pay

## 2023-09-15 DIAGNOSIS — Z419 Encounter for procedure for purposes other than remedying health state, unspecified: Secondary | ICD-10-CM | POA: Diagnosis not present

## 2023-09-27 ENCOUNTER — Other Ambulatory Visit (HOSPITAL_BASED_OUTPATIENT_CLINIC_OR_DEPARTMENT_OTHER): Payer: Self-pay

## 2023-10-08 ENCOUNTER — Other Ambulatory Visit (HOSPITAL_BASED_OUTPATIENT_CLINIC_OR_DEPARTMENT_OTHER): Payer: Self-pay

## 2023-10-22 ENCOUNTER — Other Ambulatory Visit (HOSPITAL_BASED_OUTPATIENT_CLINIC_OR_DEPARTMENT_OTHER): Payer: Self-pay

## 2023-10-27 DIAGNOSIS — Z419 Encounter for procedure for purposes other than remedying health state, unspecified: Secondary | ICD-10-CM | POA: Diagnosis not present

## 2023-11-06 ENCOUNTER — Encounter (HOSPITAL_BASED_OUTPATIENT_CLINIC_OR_DEPARTMENT_OTHER): Payer: Self-pay

## 2023-11-08 ENCOUNTER — Telehealth: Payer: Self-pay | Admitting: Cardiology

## 2023-11-08 NOTE — Progress Notes (Unsigned)
 [facility] Note   PCP: Wilhelmena Hanson, FNP (last seen 06/24) Primary Cardiologist: Lawana Pray, NP (last seen 03/23)  HPI:  Kirk Lyons is a 53 y/o male with a history of HTN, gout, tobacco use and chronic heart failure.   Was in the ED 04/03/22 due to left ankle pain where he was evaluated and released. Was in the ED 05/12/22 due to gout where he was treated and released. Was in the ED 01/30/23 due to allergic reaction. Was in the ED 03/19/23 due to gout flare.   Echo 07/20/21: Left Ventricle: Left ventricular ejection fraction, by estimation, is 45 to 50%. The left ventricle has mildly decreased function. The left ventricle demonstrates global hypokinesis. The left ventricular internal cavity size was normal in size. There is mild left ventricular hypertrophy. Left ventricular diastolic parameters are consistent with Grade II diastolic dysfunction (pseudonormalization). Right Ventricle: The right ventricular size is normal. Moderately increased right ventricular wall thickness. Left Atrium: Left atrial size was mildly dilated. Right Atrium: Right atrial size was mildly dilated  He presents today for a HF follow-up visit with a chief complaint of minimal SOB with moderate exertion. Has associated dry cough along with this. Denies fatigue, chest pain, palpitations, abdominal distention, pedal edema, dizziness, weight gain or difficulty sleeping. Not taking furosemide  daily due to frequent urination when he lays down at night. Does take furosemide  on the weekends when he's not working.   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 Not wearing CPAP consistently. He says that when he got the new equipment, it "blew water in my nose" and then his mom died and he went out of state so he got out of the habit of wearing it.   ROS: All systems negative except as listed in HPI, PMH and Problem List.  SH:  Social  History   Socioeconomic History   Marital status: Single    Spouse name: Not on file   Number of children: Not on file   Years of education: Not on file   Highest education level: Not on file  Occupational History   Not on file  Tobacco Use   Smoking status: Some Days    Types: Cigarettes   Smokeless tobacco: Never   Tobacco comments:    May smoke a cigarette if he has a beer  Vaping Use   Vaping status: Never Used  Substance and Sexual Activity   Alcohol use: Yes    Comment: social   Drug use: No   Sexual activity: Not Currently  Other Topics Concern   Not on file  Social History Narrative   Not on file   Social Drivers of Health   Financial Resource Strain: High Risk (12/28/2022)   Overall Financial Resource Strain (CARDIA)    Difficulty of Paying Living Expenses: Very hard  Food Insecurity: Food Insecurity Present (12/28/2022)   Hunger Vital Sign    Worried About Running Out of Food in the Last Year: Sometimes true    Ran Out of Food in the Last Year: Sometimes true  Transportation Needs: No Transportation Needs (01/24/2023)   PRAPARE - Administrator, Civil Service (Medical): No    Lack of Transportation (Non-Medical): No  Recent Concern: Transportation Needs - Unmet Transportation Needs (01/02/2023)   PRAPARE - Administrator, Civil Service (Medical): No    Lack of Transportation (Non-Medical): Yes  Physical  Activity: Unknown (07/12/2021)   Exercise Vital Sign    Days of Exercise per Week: 5 days    Minutes of Exercise per Session: Not on file  Stress: Stress Concern Present (01/02/2023)   [employer] of Occupational Health - Occupational Stress Questionnaire    Feeling of Stress : Very much  Social Connections: Socially Isolated (07/12/2021)   Social Connection and Isolation Panel [NHANES]    Frequency of Communication with Friends and Family: More than three times a week    Frequency of Social Gatherings with Friends and Family:  Once a week    Attends Religious Services: Never    Database administrator or Organizations: No    Attends Banker Meetings: Never    Marital Status: Never married  Intimate Partner Violence: Not At Risk (07/12/2021)   Humiliation, Afraid, Rape, and Kick questionnaire    Fear of Current or Ex-Partner: No    Emotionally Abused: No    Physically Abused: No    Sexually Abused: No    FH:  Family History  Problem Relation Age of Onset   Breast cancer Mother    Hyperlipidemia Father    Coronary artery disease Father    Prostate cancer Father    Testicular cancer Brother     Past Medical History:  Diagnosis Date   CHF (congestive heart failure) (HCC)    Chronic combined systolic and diastolic heart failure (HCC) 07/12/2021   Chronic diastolic heart failure (HCC) 07/12/2021   Essential hypertension 07/12/2021   Gout    Heart failure, type unknown (HCC) 07/12/2021   Hypertension    Obesity (BMI 30.0-34.9) 07/12/2021   OSA (obstructive sleep apnea) 07/12/2021   Tobacco use 07/12/2021    Current Outpatient Medications  Medication Sig Dispense Refill   albuterol  (VENTOLIN  HFA) 108 (90 Base) MCG/ACT inhaler Inhale 2 puffs into the lungs every 6 (six) hours as needed for wheezing or shortness of breath. 18 g 1   carvedilol  (COREG ) 6.25 MG tablet Take 0.5 tablets (3.125 mg total) by mouth 2 (two) times daily. 90 tablet 2   empagliflozin  (JARDIANCE ) 10 MG TABS tablet Take 1 tablet (10 mg total) by mouth daily. 90 tablet 3   furosemide  (LASIX ) 20 MG tablet Take 1-2 tablets (20-40 mg total) by mouth daily. 60 tablet 5   furosemide  (LASIX ) 40 MG tablet Take 1 tablet (40 mg total) by mouth daily. With additional 40mg  if needed 100 tablet 3   metFORMIN  (GLUCOPHAGE ) 500 MG tablet Take 1 tablet (500 mg total) by mouth 2 (two) times daily with a meal. 60 tablet 3   Multiple Vitamins-Minerals (EMERGEN-C VITAMIN C) PACK Take 1 Package by mouth as needed (immune system support).      rosuvastatin  (CRESTOR ) 40 MG tablet Take 1 tablet (40 mg total) by mouth daily. 90 tablet 3   sacubitril -valsartan  (ENTRESTO ) 97-103 MG Take 1 tablet by mouth 2 (two) times daily. 180 tablet 2   No current facility-administered medications for this visit.   There were no vitals filed for this visit.  Wt Readings from Last 3 Encounters:  06/29/23 245 lb (111.1 kg)  03/19/23 237 lb (107.5 kg)  01/30/23 238 lb (108 kg)   Lab Results  Component Value Date   CREATININE 1.22 06/29/2023   CREATININE 1.49 (H) 01/30/2023   CREATININE 1.06 09/29/2022    PHYSICAL EXAM:  General:  Well appearing. No resp difficulty HEENT: normal Neck: supple. JVP flat. No lymphadenopathy or thryomegaly appreciated. Cor: PMI normal. Regular  rate & rhythm. No rubs, gallops or murmurs. Lungs: clear Abdomen: soft, nontender, nondistended. No hepatosplenomegaly. No bruits or masses. Extremities: no cyanosis, clubbing, rash, edema Neuro: alert & oriented x3, cranial nerves grossly intact. Moves all 4 extremities w/o difficulty. Affect pleasant.   ECG: not done  ReDs: 44%   ASSESSMENT & PLAN:  1: NICM with minimally reduced ejection fraction- - suspect due to HTN/ untreated OSA - NYHA class II - euvolemic today - weighing daily; reminded to call for an overnight weight gain of > 2 pounds or a weekly weight gain of >5 pounds - weight up 5 pounds from last visit here 9 months ago - ReDs 44% - take 80mg  furosemide  daily X 2 days & then take 20-40mg  daily after that - Echo 07/20/21: EF 45-50% with mild LVH, Grade II DD, mild LAE, mild dilatation of ascending aorta at 38 mm - continue carvedilol  3.125mg  BID - continue jardiance  10mg  daily - continue entresto  97/103mg  BID - not adding salt and tries to review food labels - saw cardiology Eduard Grad) on 03/23; returns 01/25 - would like to add MRA but unsure if he can manage getting the frequent labs drawn - BNP 07/06/21 was 160.6 - proBNP today  2: HTN- -  BP 141/91 - saw PCP Randal Bury) 06/24 - BMP 01/30/23 reviewed and showed sodium 140, potassium 4.1, creatinine 1.49 and GFR 56  - BMET today  3: Obstructive sleep apnea-  - not consistently wearing his CPAP because he went out of state when his mom died and got out of the habit of wearing it; emphasized the importance of resuming wearing it daily - had repeat sleep study on 10/07/21  - saw cardiology Theodis Fiscal) 09/23; he needs to call her office and schedule f/u appt  4: Diabetes mellitus- - A1C on 12/20/22 was 6.2% - continue metformin  500mg  BID - A1c today   Return in 2 weeks, sooner if needed. Repeat BMET at that time.      Charlette Console, FNP 11/08/23

## 2023-11-08 NOTE — Telephone Encounter (Signed)
 Called to confirm/remind patient of their appointment at the [facility] on 11/09/23.   Appointment:   [x] Confirmed  [] Left mess   [] No answer/No voice mail  [] VM Full/unable to leave message  [] Phone not in service  Patient reminded to bring all medications and/or complete list.  Confirmed patient has transportation. Gave directions, instructed to utilize valet parking.

## 2023-11-09 ENCOUNTER — Encounter: Payer: Self-pay | Admitting: Family

## 2023-11-09 ENCOUNTER — Ambulatory Visit: Attending: Family | Admitting: Family

## 2023-11-09 VITALS — BP 147/100 | HR 93 | Wt 238.8 lb

## 2023-11-09 DIAGNOSIS — I428 Other cardiomyopathies: Secondary | ICD-10-CM | POA: Insufficient documentation

## 2023-11-09 DIAGNOSIS — G4733 Obstructive sleep apnea (adult) (pediatric): Secondary | ICD-10-CM | POA: Insufficient documentation

## 2023-11-09 DIAGNOSIS — I5022 Chronic systolic (congestive) heart failure: Secondary | ICD-10-CM | POA: Insufficient documentation

## 2023-11-09 DIAGNOSIS — Z7984 Long term (current) use of oral hypoglycemic drugs: Secondary | ICD-10-CM | POA: Insufficient documentation

## 2023-11-09 DIAGNOSIS — Z79899 Other long term (current) drug therapy: Secondary | ICD-10-CM | POA: Insufficient documentation

## 2023-11-09 DIAGNOSIS — E119 Type 2 diabetes mellitus without complications: Secondary | ICD-10-CM | POA: Insufficient documentation

## 2023-11-09 DIAGNOSIS — R0602 Shortness of breath: Secondary | ICD-10-CM | POA: Diagnosis present

## 2023-11-09 DIAGNOSIS — I10 Essential (primary) hypertension: Secondary | ICD-10-CM | POA: Diagnosis not present

## 2023-11-09 DIAGNOSIS — I110 Hypertensive heart disease with heart failure: Secondary | ICD-10-CM | POA: Insufficient documentation

## 2023-11-09 NOTE — Patient Instructions (Addendum)
 Medication Changes:  No medication changes today!  Lab Work:  Go DOWN to LOWER LEVEL (LL) to have your blood work completed inside of [employer] office.  We will only call you if the results are abnormal or if the provider would like to make medication changes.   Testing/Procedures:  Your physician has requested that you have an echocardiogram. Echocardiography is a painless test that uses sound waves to create images of your heart. It provides your doctor with information about the size and shape of your heart and how well your heart's chambers and valves are working. This procedure takes approximately one hour. There are no restrictions for this procedure. Please do NOT wear cologne, perfume, aftershave, or lotions (deodorant is allowed). Please arrive 15 minutes prior to your appointment time.  Please note: We ask at that you not bring children with you during ultrasound (echo/ vascular) testing. Due to room size and safety concerns, children are not allowed in the ultrasound rooms during exams. Our front office staff cannot provide observation of children in our lobby area while testing is being conducted. An adult accompanying a patient to their appointment will only be allowed in the ultrasound room at the discretion of the ultrasound technician under special circumstances. We apologize for any inconvenience.  Please have your echo completed. You will check in for this at the [facility]. You have to arrive 15 MINS EARLY for preparation, otherwise you will have to reschedule.   Special Instructions // Education:  Call your primary care doctor and get an appointment scheduled.    It was good to see you today!   If you receive a satisfaction survey regarding the Heart Failure Clinic, please take the time to fill it out. This way we can continue to provide excellent care and make any changes that need to be made.   Follow-Up in: Please follow up with the [facility] in June after your echocardiogram.  At the [facility], you and your health needs are our priority. We have a designated team specialized in the treatment of Heart Failure. This Care Team includes your primary Heart Failure Specialized Cardiologist (physician), Advanced Practice Providers (APPs- Physician Assistants and Nurse Practitioners), and Pharmacist who all work together to provide you with the care you need, when you need it.   You may see any of the following providers on your designated Care Team at your next follow up:  Dr. Jules Oar Dr. Peder Bourdon Dr. Alwin Baars Dr. Judyth Nunnery Shawnee Dellen, FNP Bevely Brush, RPH-CPP  Please be sure to bring in all your medications bottles to every appointment.   Need to Contact Us :  If you have any questions or concerns before your next appointment please send us  a message through [place] or call our office at [phone].    TO LEAVE A MESSAGE FOR THE NURSE SELECT OPTION 2, PLEASE LEAVE A MESSAGE INCLUDING: YOUR NAME DATE OF BIRTH CALL BACK NUMBER REASON FOR CALL**this is important as we prioritize the call backs  YOU WILL RECEIVE A CALL BACK THE SAME DAY AS LONG AS YOU CALL BEFORE 4:00 PM

## 2023-11-10 LAB — BASIC METABOLIC PANEL WITH GFR
BUN/Creatinine Ratio: 14 (ref 9–20)
BUN: 17 mg/dL (ref 6–24)
CO2: 23 mmol/L (ref 20–29)
Calcium: 9.2 mg/dL (ref 8.7–10.2)
Chloride: 101 mmol/L (ref 96–106)
Creatinine, Ser: 1.21 mg/dL (ref 0.76–1.27)
Glucose: 98 mg/dL (ref 70–99)
Potassium: 4 mmol/L (ref 3.5–5.2)
Sodium: 139 mmol/L (ref 134–144)
eGFR: 72 mL/min/{1.73_m2} (ref >59)

## 2023-11-11 ENCOUNTER — Encounter: Payer: Self-pay | Admitting: Family

## 2023-11-26 DIAGNOSIS — Z419 Encounter for procedure for purposes other than remedying health state, unspecified: Secondary | ICD-10-CM | POA: Diagnosis not present

## 2023-12-04 ENCOUNTER — Encounter: Payer: Self-pay | Admitting: Student in an Organized Health Care Education/Training Program

## 2023-12-04 ENCOUNTER — Other Ambulatory Visit (HOSPITAL_BASED_OUTPATIENT_CLINIC_OR_DEPARTMENT_OTHER): Payer: Self-pay

## 2023-12-04 ENCOUNTER — Ambulatory Visit: Admitting: Student in an Organized Health Care Education/Training Program

## 2023-12-04 VITALS — BP 135/94 | HR 84 | Temp 98.0°F | Ht 73.0 in | Wt 240.4 lb

## 2023-12-04 DIAGNOSIS — E1169 Type 2 diabetes mellitus with other specified complication: Secondary | ICD-10-CM | POA: Diagnosis not present

## 2023-12-04 DIAGNOSIS — Z7984 Long term (current) use of oral hypoglycemic drugs: Secondary | ICD-10-CM

## 2023-12-04 DIAGNOSIS — I5042 Chronic combined systolic (congestive) and diastolic (congestive) heart failure: Secondary | ICD-10-CM

## 2023-12-04 DIAGNOSIS — I10 Essential (primary) hypertension: Secondary | ICD-10-CM | POA: Diagnosis not present

## 2023-12-04 DIAGNOSIS — E119 Type 2 diabetes mellitus without complications: Secondary | ICD-10-CM

## 2023-12-04 DIAGNOSIS — M1A9XX0 Chronic gout, unspecified, without tophus (tophi): Secondary | ICD-10-CM | POA: Diagnosis not present

## 2023-12-04 LAB — POCT GLYCOSYLATED HEMOGLOBIN (HGB A1C): Hemoglobin A1C: 6.3 % — AB (ref 4.0–5.6)

## 2023-12-04 MED ORDER — COLCHICINE 0.6 MG PO TABS
0.6000 mg | ORAL_TABLET | Freq: Every day | ORAL | 0 refills | Status: DC
Start: 2023-12-04 — End: 2024-02-27

## 2023-12-04 MED ORDER — OXYCODONE-ACETAMINOPHEN 5-325 MG PO TABS
1.0000 | ORAL_TABLET | Freq: Two times a day (BID) | ORAL | 0 refills | Status: AC | PRN
Start: 2023-12-04 — End: 2023-12-09

## 2023-12-04 MED ORDER — PREDNISONE 20 MG PO TABS
40.0000 mg | ORAL_TABLET | Freq: Every day | ORAL | 0 refills | Status: AC
Start: 2023-12-04 — End: 2023-12-09

## 2023-12-04 NOTE — Assessment & Plan Note (Signed)
 Chronic and stable.  Blood pressure well-controlled at 135/94.  Plan to continue Entresto .  I reviewed labs from April that shows normal renal function.

## 2023-12-04 NOTE — Assessment & Plan Note (Signed)
 Chronic and stable.  Diagnosed about 3 years ago.  On goal-directed medical therapy with carvedilol , Entresto , and Jardiance .  Appears euvolemic today.  NYHA class I symptoms.  No need for diuresis.  He has a repeat echocardiogram planned for next week.  Hypertension and sleep apnea are being better controlled now.

## 2023-12-04 NOTE — Patient Instructions (Signed)
 Colchicine  instructions:   Take 1.2 mg (two 0.6 mg tablets) by mouth right away, then 0.6 mg (one tablet) one hour later. Do not take more than this amount in a 24-hour period. This is the maximum recommended dose for treating a gout flare.   After the first day, if your doctor instructs, you may continue with 0.6 mg once or twice daily until the flare resolves, but do not exceed the dose your doctor prescribes.   Swallow the tablets with water. You can take colchicine  with or without food.   Do not repeat this treatment course more than once every 3 days

## 2023-12-04 NOTE — Assessment & Plan Note (Signed)
 Acute issue of gout flare.  He is having a right foot podagra consistent with acute gout.  Plan to treat with colchicine .  Renal function is normal.  In the past he has needed prednisone .  I prescribed this but gave him instructions to only use it if his symptoms of discomfort worsen.  Hopefully we can treat this with colchicine  alone.  For the discomfort he does not want to take NSAIDs because of his comorbidities.  We decided to use Percocet low-dose as needed over the next 5 days until the Symptoms resolve.  I talked to him about safe use of Percocet.

## 2023-12-04 NOTE — Assessment & Plan Note (Signed)
 Chronic and stable.  A1c today 6.3%.  Excellent control.  No signs of complications of the diabetes.  Will check a urine microalbumin today.  Foot exam today was normal.  Plan to continue metformin  5 mg twice daily and Jardiance .  Entresto  for renal protection.  Rosuvastatin  for primary prevention of ischemic disease.

## 2023-12-04 NOTE — Progress Notes (Signed)
 New Patient Office Visit  Subjective    Patient ID: Kirk Lyons, male    DOB: [birth year]  Age: 53 y.o. MRN: [id number]  CC:   Chief Complaint  Patient presents with   Foot Pain    Pt notes Rt foot has gout notes started 3 days ago, first toe joint or Great toe, notes Hxt gout and would like the Colchicine  prednisone  and percocet     HPI  [name] presents to establish care  53 year old person living with heart failure with mildly reduced ejection fraction most likely due to longstanding hypertension and obstructive sleep apnea, here for management of diabetes and with an acute concern of gout in his right foot.  He reports a long history of gout, gets flares about every 6 months.  He is not interested in prophylactic treatment because he does not want to take an extra pill every day.  In the past he has used colchicine , prednisone , and Percocet which has worked well for him.  He reports this pain has been on for about 3 days affecting the right great toe.  Still able to work today.  No fevers or chills.  Heart failure has been well-managed by the heart failure clinic at [facility].  Good adherence with medications.  He gets access to medications through Medicaid, gets them filled at the [facility] pharmacy on [address].  Reports good exertional capacity.  Denies dyspnea with exertion.  He works a Designer, industrial/product.  Stopped using furosemide  recently because of increased sweating outside with the warmer temperatures.  Denies any side effects to his medications.  He lives in [place].  Reports good family and social support.  Denies any concerns about tobacco or alcohol use.    Outpatient Encounter Medications as of 12/04/2023  Medication Sig   albuterol  (VENTOLIN  HFA) 108 (90 Base) MCG/ACT inhaler Inhale 2 puffs into the lungs every 6 (six) hours as needed for wheezing or shortness of breath.   carvedilol  (COREG ) 6.25 MG tablet Take 0.5 tablets (3.125 mg total)  by mouth 2 (two) times daily.   colchicine  0.6 MG tablet Take 1 tablet (0.6 mg total) by mouth daily.   empagliflozin  (JARDIANCE ) 10 MG TABS tablet Take 1 tablet (10 mg total) by mouth daily.   metFORMIN  (GLUCOPHAGE ) 500 MG tablet Take 1 tablet (500 mg total) by mouth 2 (two) times daily with a meal.   Multiple Vitamins-Minerals (EMERGEN-C VITAMIN C) PACK Take 1 Package by mouth as needed (immune system support).   oxyCODONE -acetaminophen  (PERCOCET/ROXICET) 5-325 MG tablet Take 1 tablet by mouth 2 (two) times daily as needed for up to 5 days for severe pain (pain score 7-10).   predniSONE  (DELTASONE ) 20 MG tablet Take 2 tablets (40 mg total) by mouth daily with breakfast for 5 days.   rosuvastatin  (CRESTOR ) 40 MG tablet Take 1 tablet (40 mg total) by mouth daily.   sacubitril -valsartan  (ENTRESTO ) 97-103 MG Take 1 tablet by mouth 2 (two) times daily.   No facility-administered encounter medications on file as of 12/04/2023.    Past Medical History:  Diagnosis Date   CHF (congestive heart failure) (HCC)    Chronic combined systolic and diastolic heart failure (HCC) 07/12/2021   Chronic diastolic heart failure (HCC) 07/12/2021   Essential hypertension 07/12/2021   Gout    Heart failure, type unknown (HCC) 07/12/2021   Hypertension    Obesity (BMI 30.0-34.9) 07/12/2021   OSA (obstructive sleep apnea) 07/12/2021   Tobacco use 07/12/2021  Past Surgical History:  Procedure Laterality Date   HERNIA REPAIR      Family History  Problem Relation Age of Onset   Breast cancer Mother    Hyperlipidemia Father    Coronary artery disease Father    Prostate cancer Father    Testicular cancer Brother     Social History   Socioeconomic History   Marital status: Single    Spouse name: Not on file   Number of children: Not on file   Years of education: Not on file   Highest education level: Not on file  Occupational History   Not on file  Tobacco Use   Smoking status: Some Days     Types: Cigarettes   Smokeless tobacco: Never   Tobacco comments:    May smoke a cigarette if he has a beer  Vaping Use   Vaping status: Never Used  Substance and Sexual Activity   Alcohol use: Yes    Comment: social   Drug use: No   Sexual activity: Not Currently  Other Topics Concern   Not on file  Social History Narrative   Not on file   Social Drivers of Health   Financial Resource Strain: High Risk (12/28/2022)   Overall Financial Resource Strain (CARDIA)    Difficulty of Paying Living Expenses: Very hard  Food Insecurity: Food Insecurity Present (12/28/2022)   Hunger Vital Sign    Worried About Running Out of Food in the Last Year: Sometimes true    Ran Out of Food in the Last Year: Sometimes true  Transportation Needs: No Transportation Needs (01/24/2023)   PRAPARE - Administrator, Civil Service (Medical): No    Lack of Transportation (Non-Medical): No  Recent Concern: Transportation Needs - Unmet Transportation Needs (01/02/2023)   PRAPARE - Transportation    Lack of Transportation (Medical): No    Lack of Transportation (Non-Medical): Yes  Physical Activity: Unknown (07/12/2021)   Exercise Vital Sign    Days of Exercise per Week: 5 days    Minutes of Exercise per Session: Not on file  Stress: Stress Concern Present (01/02/2023)   [employer] of Occupational Health - Occupational Stress Questionnaire    Feeling of Stress : Very much  Social Connections: Socially Isolated (07/12/2021)   Social Connection and Isolation Panel [NHANES]    Frequency of Communication with Friends and Family: More than three times a week    Frequency of Social Gatherings with Friends and Family: Once a week    Attends Religious Services: Never    Database administrator or Organizations: No    Attends Banker Meetings: Never    Marital Status: Never married  Intimate Partner Violence: Not At Risk (07/12/2021)   Humiliation, Afraid, Rape, and Kick questionnaire     Fear of Current or Ex-Partner: No    Emotionally Abused: No    Physically Abused: No    Sexually Abused: No        Objective    BP (!) 135/94   Pulse 84   Temp 98 F (36.7 C) (Temporal)   Ht 6\' 1"  (1.854 m)   Wt 240 lb 6.4 oz (109 kg)   SpO2 96%   BMI 31.72 kg/m   Physical Exam  Gen: Well-appearing man Eyes: Normal Ears: Normal bilateral tympanic membranes Neck: Normal thyroid, no nodules or adenopathy, no JVD Heart: Regular, no murmur Lungs: Unlabored, clear to auscultation throughout Ext: Warm, no lower extremity edema, right great toe has  mild amount of erythema around the MTP joint, mild onychomycosis in both great toes Neuro: Alert, conversational, full strength in the upper and lower extremities Psych: Appropriate mood and affect, not anxious or depressed appearing.     Assessment & Plan:   Problem List Items Addressed This Visit       High   Chronic combined systolic and diastolic heart failure (HCC) (Chronic)   Chronic and stable.  Diagnosed about 3 years ago.  On goal-directed medical therapy with carvedilol , Entresto , and Jardiance .  Appears euvolemic today.  NYHA class I symptoms.  No need for diuresis.  He has a repeat echocardiogram planned for next week.  Hypertension and sleep apnea are being better controlled now.      Essential hypertension (Chronic)   Chronic and stable.  Blood pressure well-controlled at 135/94.  Plan to continue Entresto .  I reviewed labs from April that shows normal renal function.      Type 2 diabetes mellitus with other specified complication (HCC) (Chronic)   Chronic and stable.  A1c today 6.3%.  Excellent control.  No signs of complications of the diabetes.  Will check a urine microalbumin today.  Foot exam today was normal.  Plan to continue metformin  5 mg twice daily and Jardiance .  Entresto  for renal protection.  Rosuvastatin  for primary prevention of ischemic disease.        Medium    Gout (Chronic)   Acute  issue of gout flare.  He is having a right foot podagra consistent with acute gout.  Plan to treat with colchicine .  Renal function is normal.  In the past he has needed prednisone .  I prescribed this but gave him instructions to only use it if his symptoms of discomfort worsen.  Hopefully we can treat this with colchicine  alone.  For the discomfort he does not want to take NSAIDs because of his comorbidities.  We decided to use Percocet low-dose as needed over the next 5 days until the Symptoms resolve.  I talked to him about safe use of Percocet.      Relevant Medications   colchicine  0.6 MG tablet   predniSONE  (DELTASONE ) 20 MG tablet   oxyCODONE -acetaminophen  (PERCOCET/ROXICET) 5-325 MG tablet   Other Visit Diagnoses       Type 2 diabetes mellitus without complication, without long-term current use of insulin (HCC)    -  Primary   Relevant Orders   POCT glycosylated hemoglobin (Hb A1C) (Completed)   Microalbumin / creatinine urine ratio       Return in about 3 months (around 03/05/2024) for diabetes management.   Ether Hercules, MD

## 2023-12-05 LAB — MICROALBUMIN / CREATININE URINE RATIO
Creatinine,U: 136 mg/dL
Microalb Creat Ratio: 202.4 mg/g — ABNORMAL HIGH (ref 0.0–30.0)
Microalb, Ur: 27.5 mg/dL — ABNORMAL HIGH (ref 0.0–1.9)

## 2023-12-07 ENCOUNTER — Other Ambulatory Visit (HOSPITAL_BASED_OUTPATIENT_CLINIC_OR_DEPARTMENT_OTHER): Payer: Self-pay

## 2023-12-11 ENCOUNTER — Telehealth: Payer: Self-pay

## 2023-12-11 NOTE — Telephone Encounter (Signed)
 LVM for patient to return my call to discuss a complaint received.   Per provider: pt told me he had a very poor customer service experience with [facility] today. This is the 4:20 patient accidentally scheduled as an acute instead of a new. Patient reports that he was made to feel like people were mad at him, or that he did something wrong in scheduling the visit. On the phone, he reports that [facility] told him to "only answer the questions I am asking."

## 2023-12-27 ENCOUNTER — Ambulatory Visit: Admission: RE | Admit: 2023-12-27 | Source: Ambulatory Visit

## 2023-12-27 DIAGNOSIS — Z419 Encounter for procedure for purposes other than remedying health state, unspecified: Secondary | ICD-10-CM | POA: Diagnosis not present

## 2024-01-07 ENCOUNTER — Telehealth: Payer: Self-pay | Admitting: Family

## 2024-01-07 NOTE — Telephone Encounter (Signed)
 Called to confirm/remind patient of their appointment at the [facility] on 01/08/24.   Appointment:   [x] Confirmed  [] Left mess   [] No answer/No voice mail  [] VM Full/unable to leave message  [] Phone not in service  Patient reminded to bring all medications and/or complete list.  Confirmed patient has transportation. Gave directions, instructed to utilize valet parking.

## 2024-01-07 NOTE — Progress Notes (Deleted)
 [facility] Note   PCP: Towana Small, FNP (last seen 06/24) Primary Cardiologist: Emelia Hazy, NP (last seen 03/23)  Chief Complaint: shortness of breath  HPI:  Kirk Lyons is a 53 y/o male with a history of HTN, gout, tobacco use and chronic heart failure.   Was in the ED 04/03/22 due to left ankle pain where he was evaluated and released. Was in the ED 05/12/22 due to gout where he was treated and released. Was in the ED 01/30/23 due to allergic reaction. Was in the ED 03/19/23 due to gout flare.   Echo 07/20/21: Left Ventricle: Left ventricular ejection fraction, by estimation, is 45 to 50%. The left ventricle has mildly decreased function. The left ventricle demonstrates global hypokinesis. The left ventricular internal cavity size was normal in size. There is mild left ventricular hypertrophy. Left ventricular diastolic parameters are consistent with Grade II diastolic dysfunction (pseudonormalization). Right Ventricle: The right ventricular size is normal. Moderately increased right ventricular wall thickness. Left Atrium: Left atrial size was mildly dilated. Right Atrium: Right atrial size was mildly dilated  He presents today for a HF follow-up visit with a chief complaint of very little shortness of breath. Has associated rare palpitations. Denies fatigue, chest pain, abdominal distention, pedal edema, dizziness, weight gain or difficulty sleeping.  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 Drinks ~ 1/2 gallon of water just while working. Just took his medications ~ 1/2 hour ago.   ROS: All systems negative except as listed in HPI, PMH and Problem List.  SH:  Social History   Socioeconomic History   Marital status: Single    Spouse name: Not on file   Number of children: Not on file   Years of education: Not on file   Highest education level: Not on file  Occupational History   Not on file  Tobacco Use   Smoking  status: Some Days    Types: Cigarettes   Smokeless tobacco: Never   Tobacco comments:    May smoke a cigarette if he has a beer  Vaping Use   Vaping status: Never Used  Substance and Sexual Activity   Alcohol use: Yes    Comment: social   Drug use: No   Sexual activity: Not Currently  Other Topics Concern   Not on file  Social History Narrative   Not on file   Social Drivers of Health   Financial Resource Strain: High Risk (12/28/2022)   Overall Financial Resource Strain (CARDIA)    Difficulty of Paying Living Expenses: Very hard  Food Insecurity: Food Insecurity Present (12/28/2022)   Hunger Vital Sign    Worried About Running Out of Food in the Last Year: Sometimes true    Ran Out of Food in the Last Year: Sometimes true  Transportation Needs: No Transportation Needs (01/24/2023)   PRAPARE - Administrator, Civil Service (Medical): No    Lack of Transportation (Non-Medical): No  Recent Concern: Transportation Needs - Unmet Transportation Needs (01/02/2023)   PRAPARE - Transportation    Lack of Transportation (Medical): No    Lack of Transportation (Non-Medical): Yes  Physical Activity: Unknown (07/12/2021)   Exercise Vital Sign    Days of Exercise per Week: 5 days    Minutes of Exercise per Session: Not on file  Stress: Stress Concern Present (01/02/2023)   [employer] of Occupational Health - Occupational Stress Questionnaire    Feeling of Stress :  Very much  Social Connections: Socially Isolated (07/12/2021)   Social Connection and Isolation Panel    Frequency of Communication with Friends and Family: More than three times a week    Frequency of Social Gatherings with Friends and Family: Once a week    Attends Religious Services: Never    Database administrator or Organizations: No    Attends Banker Meetings: Never    Marital Status: Never married  Intimate Partner Violence: Not At Risk (07/12/2021)   Humiliation, Afraid, Rape, and Kick  questionnaire    Fear of Current or Ex-Partner: No    Emotionally Abused: No    Physically Abused: No    Sexually Abused: No    FH:  Family History  Problem Relation Age of Onset   Breast cancer Mother    Hyperlipidemia Father    Coronary artery disease Father    Prostate cancer Father    Testicular cancer Brother     Past Medical History:  Diagnosis Date   CHF (congestive heart failure) (HCC)    Chronic combined systolic and diastolic heart failure (HCC) 07/12/2021   Chronic diastolic heart failure (HCC) 07/12/2021   Essential hypertension 07/12/2021   Gout    Heart failure, type unknown (HCC) 07/12/2021   Hypertension    Obesity (BMI 30.0-34.9) 07/12/2021   OSA (obstructive sleep apnea) 07/12/2021   Tobacco use 07/12/2021    Current Outpatient Medications  Medication Sig Dispense Refill   albuterol  (VENTOLIN  HFA) 108 (90 Base) MCG/ACT inhaler Inhale 2 puffs into the lungs every 6 (six) hours as needed for wheezing or shortness of breath. 18 g 1   carvedilol  (COREG ) 6.25 MG tablet Take 0.5 tablets (3.125 mg total) by mouth 2 (two) times daily. 90 tablet 2   colchicine  0.6 MG tablet Take 1 tablet (0.6 mg total) by mouth daily. 30 tablet 0   empagliflozin  (JARDIANCE ) 10 MG TABS tablet Take 1 tablet (10 mg total) by mouth daily. 90 tablet 3   metFORMIN  (GLUCOPHAGE ) 500 MG tablet Take 1 tablet (500 mg total) by mouth 2 (two) times daily with a meal. 60 tablet 3   Multiple Vitamins-Minerals (EMERGEN-C VITAMIN C) PACK Take 1 Package by mouth as needed (immune system support).     rosuvastatin  (CRESTOR ) 40 MG tablet Take 1 tablet (40 mg total) by mouth daily. 90 tablet 3   sacubitril -valsartan  (ENTRESTO ) 97-103 MG Take 1 tablet by mouth 2 (two) times daily. 180 tablet 2   No current facility-administered medications for this visit.   There were no vitals filed for this visit.  Wt Readings from Last 3 Encounters:  12/04/23 240 lb 6.4 oz (109 kg)  11/09/23 238 lb 12.8 oz  (108.3 kg)  06/29/23 245 lb (111.1 kg)   Lab Results  Component Value Date   CREATININE 1.21 11/09/2023   CREATININE 1.22 06/29/2023   CREATININE 1.49 (H) 01/30/2023    PHYSICAL EXAM:  General: Well appearing. No resp difficulty HEENT: normal Neck: supple, no JVD Cor: Regular rhythm, rate. No rubs, gallops or murmurs Lungs: clear Abdomen: soft, nontender, nondistended. Extremities: no cyanosis, clubbing, rash, edema Neuro: alert & oriented X 3. Moves all 4 extremities w/o difficulty. Affect pleasant   ECG: not done    ASSESSMENT & PLAN:  1: NICM with minimally reduced ejection fraction- - suspect due to HTN/ untreated OSA - NYHA Lyons II - euvolemic today - weighing daily; reminded to call for an overnight weight gain of > 2 pounds or a  weekly weight gain of >5 pounds - weight down 7 pounds from last visit here 4 months ago - Echo 07/20/21: EF 45-50% with mild LVH, Grade II DD, mild LAE, mild dilatation of ascending aorta at 38 mm - will get updated echo scheduled - continue carvedilol  3.125mg  BID - change furosemide  to 40mg  PRN based on weight gain or edema - continue jardiance  10mg  daily - continue entresto  97/103mg   - would like to add MRA but unsure if he can manage getting the frequent labs drawn - BMET today - not adding salt and tries to review food labels - saw cardiology Patricio) on 03/23; NS 01/25 - BNP 07/06/21 was 160.6  2: HTN- - BP 147/100 but he just took his meds 1/2 ago; encouraged him to take them earlier in the day - saw PCP Kern) 06/24 - BMP 06/29/23 reviewed: sodium 139, potassium 4.9, creatinine 1.22 and GFR >60  - BMET today  3: Obstructive sleep apnea-  - not consistently wearing his CPAP because [place] mom [place] and got out of the habit of wearing it; emphasized the importance of resuming wearing it daily - had repeat sleep study on 10/07/21  - saw cardiology Mazie) 09/23; he needs to call her office and  schedule f/u appt  4: Diabetes mellitus- - A1C on 06/29/23 was 6.3% - continue metformin  500mg  BID - encouraged to get appt scheduled w/ PCP for continued DM monitoring   Return 1 week after echo, sooner if needed.   Kirk DELENA Class, FNP 01/07/24

## 2024-01-08 ENCOUNTER — Telehealth: Payer: Self-pay

## 2024-01-08 ENCOUNTER — Encounter: Admitting: Family

## 2024-01-08 NOTE — Telephone Encounter (Signed)
 Pt left a message yesterday asking to cancel his appointment in clinic today because he missed his echocardiogram appt on 6/12. Attempted to call patient to get echo and appointment here rescheduled. Unable to reach, left voicemail to call office to reschedule.

## 2024-01-15 ENCOUNTER — Ambulatory Visit: Payer: Self-pay

## 2024-01-15 NOTE — Telephone Encounter (Signed)
 FYI

## 2024-01-15 NOTE — Telephone Encounter (Signed)
 FYI Only or Action Required?: Action required by provider: request for appointment.  Patient was last seen in primary care on 12/04/2023 by Jerrell Cleatus Ned, MD. Called Nurse Triage reporting Foot Pain. Symptoms began today. Interventions attempted: Nothing. Symptoms are: gradually worsening.  Triage Disposition: See Physician Within 24 Hours  Patient/caregiver understands and will follow disposition?: YesCopied from CRM [id number]. Topic: Clinical - Red Word Triage >> Jan 15, 2024  3:29 PM Suzen RAMAN wrote: Red Word that prompted transfer to Nurse Triage: Severe Gout Pain Reason for Disposition  [1] Redness of the skin AND [2] no fever  Answer Assessment - Initial Assessment Questions 1. ONSET: When did the pain start?      This morning- 2. LOCATION: Where is the pain located?      Left heel 3. PAIN: How bad is the pain?    (Scale 1-10; or mild, moderate, severe)  - MILD (1-3): doesn't interfere with normal activities.   - MODERATE (4-7): interferes with normal activities (e.g., work or school) or awakens from sleep, limping.   - SEVERE (8-10): excruciating pain, unable to do any normal activities, unable to walk.      7  5. CAUSE: What do you think is causing the foot pain?     Gout flare up 6. OTHER SYMPTOMS: Do you have any other symptoms? (e.g., leg pain, rash, fever, numbness)    Redness on heel    Pain hit at 9am this morning. Pt stated it has been several months since last flare-up. Pt stated since on heel he can't see/tell if swollen but does think it looks red.  Protocols used: Foot Pain-A-AH

## 2024-01-16 ENCOUNTER — Ambulatory Visit (INDEPENDENT_AMBULATORY_CARE_PROVIDER_SITE_OTHER): Admitting: Student in an Organized Health Care Education/Training Program

## 2024-01-16 ENCOUNTER — Encounter: Payer: Self-pay | Admitting: Student in an Organized Health Care Education/Training Program

## 2024-01-16 ENCOUNTER — Other Ambulatory Visit (HOSPITAL_BASED_OUTPATIENT_CLINIC_OR_DEPARTMENT_OTHER): Payer: Self-pay

## 2024-01-16 ENCOUNTER — Ambulatory Visit (HOSPITAL_BASED_OUTPATIENT_CLINIC_OR_DEPARTMENT_OTHER)
Admission: RE | Admit: 2024-01-16 | Discharge: 2024-01-16 | Disposition: A | Discharge status: Home / Self Care | Source: Ambulatory Visit | Attending: Student in an Organized Health Care Education/Training Program | Admitting: Student in an Organized Health Care Education/Training Program

## 2024-01-16 VITALS — BP 140/90 | HR 87 | Ht 73.0 in | Wt 235.4 lb

## 2024-01-16 DIAGNOSIS — M79672 Pain in left foot: Secondary | ICD-10-CM | POA: Diagnosis not present

## 2024-01-16 DIAGNOSIS — M2042 Other hammer toe(s) (acquired), left foot: Secondary | ICD-10-CM | POA: Diagnosis not present

## 2024-01-16 DIAGNOSIS — M19072 Primary osteoarthritis, left ankle and foot: Secondary | ICD-10-CM | POA: Diagnosis not present

## 2024-01-16 DIAGNOSIS — S92902A Unspecified fracture of left foot, initial encounter for closed fracture: Secondary | ICD-10-CM | POA: Diagnosis not present

## 2024-01-16 MED ORDER — DICLOFENAC SODIUM 1 % EX GEL
2.0000 g | Freq: Four times a day (QID) | CUTANEOUS | 1 refills | Status: AC
Start: 2024-01-16 — End: ?
  Filled 2024-01-16: qty 100, 7d supply, fill #0

## 2024-01-16 MED ORDER — METFORMIN HCL 500 MG PO TABS
500.0000 mg | ORAL_TABLET | Freq: Two times a day (BID) | ORAL | 1 refills | Status: DC
  Filled 2024-01-16: qty 180, 90d supply, fill #0

## 2024-01-16 NOTE — Patient Instructions (Addendum)
 For your heel pain, you may safely use acetaminophen  (Tylenol ) 1000 mg 3 times daily and the Voltaren gel onto the affected area twice daily.  I will call you when I have the results of your x-ray.  If the pain does not improve with supportive measures, we will talk about a referral to podiatry.

## 2024-01-16 NOTE — Progress Notes (Signed)
 Acute Office Visit  Subjective:     Patient ID: Kirk Lyons, male    DOB: [birth year], 53 y.o.   MRN: [id number]  Chief Complaint  Patient presents with   Acute Visit    Is having a possible gout flare, in his Left heel. Never had a flare there before, normally happens in his toes. Has been eating a better diet, first noticed Yesterday    HPI  53 year old person here for acute visit due to left foot pain.  Says the discomfort is focused solely in the heel of his left foot.  He points to the lateral heel pad, and says it only hurts when he stands on it.  It started suddenly when he got up yesterday morning.  Not improved through the day.  Does not bother him at rest.  No fevers or chills.  He is No tenderness with movement of the ankle.  No pain or tenderness with movement of the toes.  No recent traumas.  No skin breakdown.  Has never had a podiatrist before.  Still functioning, able to walk, just bothers him.  Going back to work later today.  Does not want to use NSAIDs because of fear of kidney injury.  About a month ago he had a acute gout flare of the right first toe, this was treated with prednisone  and colchicine  and has resolved completely.      Objective:    BP (!) 140/90 (BP Location: Left Arm, Patient Position: Sitting)   Pulse 87   Ht 6' 1 (1.854 m)   Wt 235 lb 6.4 oz (106.8 kg)   SpO2 97%   BMI 31.06 kg/m   Physical Exam  Gen: Well-appearing man MSK: Left foot has some mild calluses on the heel, there is no swelling at the ankle joints or around the toes.  There is no erythema of the skin.  There is very focal tenderness deep in the lateral heel pad, I was able to press around the entire bottom of the foot before being able to find the tender spot.  He has no tenderness with palpation of the plantar fascia.  No atrophy, skin breakdown.  No purulence.  No tophi or nodules.  Currently wearing sandals, has a mildly antalgic gait due to the discomfort.      Assessment  & Plan:   Problem List Items Addressed This Visit       Unprioritized   Pain of left heel - Primary   Acute problem for 1 day.  Unusual focal tenderness in the heel pad on the left lateral calcaneus.  Discomfort is not in the typical location for plantar fasciitis.  There is no swelling or erythema or nodules to suggest acute gout.  No focal trauma.  I think the differential includes heel pad syndrome, calcaneal stress fracture, or perhaps a nerve entrapment.  Discomfort is currently not functional limiting.  We talked about treating this supportively with Tylenol  and topical Voltaren.  Plan for x-ray of the left foot to evaluate for stress fracture.  I recommended referral to podiatry, patient wants to hold off for now.  I will follow-up with him after I see the results of the x-ray.  If that is unrevealing and the pain progresses, we may need to do an MRI to rule out stress fracture fully.      Relevant Medications   diclofenac Sodium (VOLTAREN) 1 % GEL   Other Relevant Orders   DG Foot Complete Left  Meds ordered this encounter  Medications   metFORMIN  (GLUCOPHAGE ) 500 MG tablet    Sig: Take 1 tablet (500 mg total) by mouth 2 (two) times daily with a meal.    Dispense:  180 tablet    Refill:  1   diclofenac Sodium (VOLTAREN) 1 % GEL    Sig: Apply 2 g topically 4 (four) times daily.    Dispense:  100 g    Refill:  1    Return if symptoms worsen or fail to improve.  Cleatus Debby Specking, MD

## 2024-01-16 NOTE — Assessment & Plan Note (Signed)
 Acute problem for 1 day.  Unusual focal tenderness in the heel pad on the left lateral calcaneus.  Discomfort is not in the typical location for plantar fasciitis.  There is no swelling or erythema or nodules to suggest acute gout.  No focal trauma.  I think the differential includes heel pad syndrome, calcaneal stress fracture, or perhaps a nerve entrapment.  Discomfort is currently not functional limiting.  We talked about treating this supportively with Tylenol  and topical Voltaren.  Plan for x-ray of the left foot to evaluate for stress fracture.  I recommended referral to podiatry, patient wants to hold off for now.  I will follow-up with him after I see the results of the x-ray.  If that is unrevealing and the pain progresses, we may need to do an MRI to rule out stress fracture fully.

## 2024-01-17 ENCOUNTER — Ambulatory Visit: Payer: Self-pay | Admitting: Student in an Organized Health Care Education/Training Program

## 2024-01-26 DIAGNOSIS — Z419 Encounter for procedure for purposes other than remedying health state, unspecified: Secondary | ICD-10-CM | POA: Diagnosis not present

## 2024-01-31 ENCOUNTER — Telehealth: Payer: Self-pay | Admitting: Family

## 2024-01-31 DIAGNOSIS — I5022 Chronic systolic (congestive) heart failure: Secondary | ICD-10-CM

## 2024-02-01 NOTE — Progress Notes (Signed)
 Order placed for [place] location for echo. Called pt and notified him regarding this change. Pt was given phone number to [place] to call and schedule his echo [phone].

## 2024-02-05 ENCOUNTER — Telehealth: Payer: Self-pay

## 2024-02-05 NOTE — Telephone Encounter (Signed)
 Pt would like to know if red light therapy saunas are approved or dangerous because of his heart failure. Advice?

## 2024-02-05 NOTE — Telephone Encounter (Signed)
 Spoke to pt to relay advice. Pt agreeable to plan. Reminded pt that he needs follow up appointment. Pt states that he will call and make appt after his follow up echo.

## 2024-02-25 ENCOUNTER — Other Ambulatory Visit: Payer: Self-pay | Admitting: Family

## 2024-02-25 ENCOUNTER — Other Ambulatory Visit (HOSPITAL_BASED_OUTPATIENT_CLINIC_OR_DEPARTMENT_OTHER): Payer: Self-pay

## 2024-02-25 DIAGNOSIS — E7800 Pure hypercholesterolemia, unspecified: Secondary | ICD-10-CM

## 2024-02-26 ENCOUNTER — Other Ambulatory Visit (HOSPITAL_BASED_OUTPATIENT_CLINIC_OR_DEPARTMENT_OTHER): Payer: Self-pay

## 2024-02-26 DIAGNOSIS — Z419 Encounter for procedure for purposes other than remedying health state, unspecified: Secondary | ICD-10-CM | POA: Diagnosis not present

## 2024-02-27 ENCOUNTER — Emergency Department (HOSPITAL_BASED_OUTPATIENT_CLINIC_OR_DEPARTMENT_OTHER)
Admission: EM | Admit: 2024-02-27 | Discharge: 2024-02-27 | Disposition: A | Discharge status: Home / Self Care | Attending: Emergency Medicine | Admitting: Emergency Medicine

## 2024-02-27 ENCOUNTER — Encounter (HOSPITAL_BASED_OUTPATIENT_CLINIC_OR_DEPARTMENT_OTHER): Payer: Self-pay

## 2024-02-27 ENCOUNTER — Other Ambulatory Visit (HOSPITAL_BASED_OUTPATIENT_CLINIC_OR_DEPARTMENT_OTHER): Payer: Self-pay

## 2024-02-27 ENCOUNTER — Emergency Department (HOSPITAL_BASED_OUTPATIENT_CLINIC_OR_DEPARTMENT_OTHER): Admitting: Radiology

## 2024-02-27 ENCOUNTER — Other Ambulatory Visit: Payer: Self-pay

## 2024-02-27 DIAGNOSIS — M109 Gout, unspecified: Secondary | ICD-10-CM | POA: Diagnosis not present

## 2024-02-27 DIAGNOSIS — M19071 Primary osteoarthritis, right ankle and foot: Secondary | ICD-10-CM | POA: Diagnosis not present

## 2024-02-27 DIAGNOSIS — M1A9XX0 Chronic gout, unspecified, without tophus (tophi): Secondary | ICD-10-CM

## 2024-02-27 DIAGNOSIS — M79671 Pain in right foot: Secondary | ICD-10-CM | POA: Diagnosis not present

## 2024-02-27 DIAGNOSIS — M25571 Pain in right ankle and joints of right foot: Secondary | ICD-10-CM | POA: Diagnosis not present

## 2024-02-27 DIAGNOSIS — M7989 Other specified soft tissue disorders: Secondary | ICD-10-CM | POA: Diagnosis not present

## 2024-02-27 MED ORDER — COLCHICINE 0.6 MG PO TABS
ORAL_TABLET | ORAL | 0 refills | Status: DC
  Filled 2024-02-27 (×2): qty 3, 1d supply, fill #0

## 2024-02-27 MED ORDER — PREDNISONE 10 MG (21) PO TBPK
ORAL_TABLET | ORAL | 0 refills | Status: DC
  Filled 2024-02-27 (×2): qty 21, 6d supply, fill #0

## 2024-02-27 MED ORDER — OXYCODONE-ACETAMINOPHEN 5-325 MG PO TABS
1.0000 | ORAL_TABLET | Freq: Four times a day (QID) | ORAL | 0 refills | Status: DC | PRN
  Filled 2024-02-27 (×2): qty 15, 4d supply, fill #0

## 2024-02-27 NOTE — ED Triage Notes (Signed)
 Arrives POV with complaints of right ankle pain and swelling x2 days. Patient reports that he may be having a gout flare up. Rates pain an 8/10. No falls or injuries.

## 2024-02-27 NOTE — ED Provider Notes (Signed)
 [place] EMERGENCY DEPARTMENT AT [facility]  Provider Note  CSN: [id number] Arrival date & time: 02/27/24 1437  History Chief Complaint  Patient presents with   Foot Pain    right    Kirk Lyons is a 53 y.o. male with history of gout reports pain in his R foot/ankle for the last two days similar to previous. He reports pain started after he stepped down into his garage, but he did not have any injury.    Home Medications Prior to Admission medications   Medication Sig Start Date End Date Taking? Authorizing Provider  oxyCODONE -acetaminophen  (PERCOCET/ROXICET) 5-325 MG tablet Take 1 tablet by mouth every 6 (six) hours as needed for severe pain (pain score 7-10). 02/27/24  Yes Roselyn Carlin NOVAK, MD  predniSONE  (STERAPRED UNI-PAK 21 TAB) 10 MG (21) TBPK tablet 10mg  Tabs, 6 day taper. Use as directed 02/27/24  Yes Roselyn Carlin NOVAK, MD  albuterol  (VENTOLIN  HFA) 108 [phone] Base) MCG/ACT inhaler Inhale 2 puffs into the lungs every 6 (six) hours as needed for wheezing or shortness of breath. 05/22/23   Butler, Kristina, FNP  carvedilol  (COREG ) 6.25 MG tablet Take 0.5 tablets (3.125 mg total) by mouth 2 (two) times daily. 05/21/23   Donette Ellouise LABOR, FNP  colchicine  0.6 MG tablet Take 2 tablets now and then 1 tablet an hour later 02/27/24   Roselyn Carlin NOVAK, MD  diclofenac  Sodium (VOLTAREN ) 1 % GEL Apply 2 g topically 4 (four) times daily. 01/16/24   Jerrell Cleatus Ned, MD  empagliflozin  (JARDIANCE ) 10 MG TABS tablet Take 1 tablet (10 mg total) by mouth daily. 05/21/23   Donette Ellouise LABOR, FNP  metFORMIN  (GLUCOPHAGE ) 500 MG tablet Take 1 tablet (500 mg total) by mouth 2 (two) times daily with a meal. 01/16/24   Jerrell Cleatus Ned, MD  Multiple Vitamins-Minerals (EMERGEN-C VITAMIN C) PACK Take 1 Package by mouth as needed (immune system support).    [provider]  rosuvastatin  (CRESTOR ) 40 MG tablet Take 1 tablet (40 mg total) by mouth daily. 12/28/22 01/16/24  Towana Small, FNP  sacubitril -valsartan  (ENTRESTO ) 97-103 MG Take 1 tablet by mouth 2 (two) times daily. 08/28/23   Donette Ellouise LABOR, FNP     Allergies    Erythromycin   Review of Systems   Review of Systems Please see HPI for pertinent positives and negatives  Physical Exam BP (!) 139/105 (BP Location: Right Arm)   Pulse 94   Temp 98.6 F (37 C)   Resp 16   Ht 6' 1 (1.854 m)   Wt 106.6 kg   SpO2 100%   BMI 31.00 kg/m   Physical Exam Vitals and nursing note reviewed.  HENT:     Head: Normocephalic.     Nose: Nose normal.  Eyes:     Extraocular Movements: Extraocular movements intact.  Pulmonary:     Effort: Pulmonary effort is normal.  Musculoskeletal:        General: Swelling and tenderness (R lateral ankle/foot) present. Normal range of motion.     Cervical back: Neck supple.  Skin:    Findings: No rash (on exposed skin).  Neurological:     Mental Status: He is alert and oriented to person, place, and time.  Psychiatric:        Mood and Affect: Mood normal.     ED Results / Procedures / Treatments   EKG None  Procedures Procedures  Medications Ordered in the ED Medications - No data to display  Initial Impression and Plan  Patient here with R ankle pain, consistent with prior gout. No injuries reported. I personally viewed the images from radiology studies and awaiting radiologist interpretation: No bony abnormalities. Patient does not want to wait for formal rads read. Will Rx colchicine , prednisone  and percocet as this has worked well for him in the past.  ED Course       MDM Rules/Calculators/A&P Medical Decision Making Problems Addressed: Acute gout of right foot, unspecified cause: acute illness or injury  Amount and/or Complexity of Data Reviewed Radiology: ordered and independent interpretation performed. Decision-making details documented in ED Course.  Risk Prescription drug management.     Final Clinical Impression(s) / ED  Diagnoses Final diagnoses:  Acute gout of right foot, unspecified cause    Rx / DC Orders ED Discharge Orders          Ordered    colchicine  0.6 MG tablet        02/27/24 1542    predniSONE  (STERAPRED UNI-PAK 21 TAB) 10 MG (21) TBPK tablet        02/27/24 1542    oxyCODONE -acetaminophen  (PERCOCET/ROXICET) 5-325 MG tablet  Every 6 hours PRN        02/27/24 1542             Roselyn Carlin NOVAK, MD 02/27/24 1542

## 2024-03-01 ENCOUNTER — Emergency Department (HOSPITAL_COMMUNITY)

## 2024-03-01 ENCOUNTER — Emergency Department (HOSPITAL_COMMUNITY)
Admission: EM | Admit: 2024-03-01 | Discharge: 2024-03-01 | Disposition: A | Discharge status: Home / Self Care | Attending: Emergency Medicine | Admitting: Emergency Medicine

## 2024-03-01 ENCOUNTER — Other Ambulatory Visit: Payer: Self-pay

## 2024-03-01 ENCOUNTER — Other Ambulatory Visit (HOSPITAL_BASED_OUTPATIENT_CLINIC_OR_DEPARTMENT_OTHER): Payer: Self-pay

## 2024-03-01 ENCOUNTER — Encounter (HOSPITAL_COMMUNITY): Payer: Self-pay

## 2024-03-01 DIAGNOSIS — I700 Atherosclerosis of aorta: Secondary | ICD-10-CM | POA: Diagnosis not present

## 2024-03-01 DIAGNOSIS — Z7984 Long term (current) use of oral hypoglycemic drugs: Secondary | ICD-10-CM | POA: Insufficient documentation

## 2024-03-01 DIAGNOSIS — I509 Heart failure, unspecified: Secondary | ICD-10-CM | POA: Insufficient documentation

## 2024-03-01 DIAGNOSIS — R9082 White matter disease, unspecified: Secondary | ICD-10-CM | POA: Diagnosis not present

## 2024-03-01 DIAGNOSIS — Z743 Need for continuous supervision: Secondary | ICD-10-CM | POA: Diagnosis not present

## 2024-03-01 DIAGNOSIS — I672 Cerebral atherosclerosis: Secondary | ICD-10-CM | POA: Diagnosis not present

## 2024-03-01 DIAGNOSIS — E119 Type 2 diabetes mellitus without complications: Secondary | ICD-10-CM | POA: Insufficient documentation

## 2024-03-01 DIAGNOSIS — R000 Tachycardia, unspecified: Secondary | ICD-10-CM | POA: Diagnosis not present

## 2024-03-01 DIAGNOSIS — R42 Dizziness and giddiness: Secondary | ICD-10-CM | POA: Insufficient documentation

## 2024-03-01 DIAGNOSIS — Z87891 Personal history of nicotine dependence: Secondary | ICD-10-CM | POA: Diagnosis not present

## 2024-03-01 DIAGNOSIS — I6522 Occlusion and stenosis of left carotid artery: Secondary | ICD-10-CM | POA: Diagnosis not present

## 2024-03-01 DIAGNOSIS — G459 Transient cerebral ischemic attack, unspecified: Secondary | ICD-10-CM | POA: Diagnosis not present

## 2024-03-01 LAB — COMPREHENSIVE METABOLIC PANEL WITH GFR
ALT: 29 U/L (ref 0–44)
AST: 31 U/L (ref 15–41)
Albumin: 3.6 g/dL (ref 3.5–5.0)
Alkaline Phosphatase: 49 U/L (ref 38–126)
Anion gap: 9 (ref 5–15)
BUN: 20 mg/dL (ref 6–20)
CO2: 21 mmol/L — ABNORMAL LOW (ref 22–32)
Calcium: 8.4 mg/dL — ABNORMAL LOW (ref 8.9–10.3)
Chloride: 107 mmol/L (ref 98–111)
Creatinine, Ser: 1.27 mg/dL — ABNORMAL HIGH (ref 0.61–1.24)
GFR, Estimated: >60 mL/min (ref >60)
Glucose, Bld: 185 mg/dL — ABNORMAL HIGH (ref 70–99)
Potassium: 4 mmol/L (ref 3.5–5.1)
Sodium: 137 mmol/L (ref 135–145)
Total Bilirubin: 0.7 mg/dL (ref 0.0–1.2)
Total Protein: 6.6 g/dL (ref 6.5–8.1)

## 2024-03-01 LAB — CBC WITH DIFFERENTIAL/PLATELET
Abs Immature Granulocytes: 0.03 K/uL (ref 0.00–0.07)
Basophils Absolute: 0.1 K/uL (ref 0.0–0.1)
Basophils Relative: 1 %
Eosinophils Absolute: 0.2 K/uL (ref 0.0–0.5)
Eosinophils Relative: 2 %
HCT: 44.2 % (ref 39.0–52.0)
Hemoglobin: 14.9 g/dL (ref 13.0–17.0)
Immature Granulocytes: 0 %
Lymphocytes Relative: 26 %
Lymphs Abs: 1.8 K/uL (ref 0.7–4.0)
MCH: 32.5 pg (ref 26.0–34.0)
MCHC: 33.7 g/dL (ref 30.0–36.0)
MCV: 96.3 fL (ref 80.0–100.0)
Monocytes Absolute: 0.7 K/uL (ref 0.1–1.0)
Monocytes Relative: 10 %
Neutro Abs: 4.2 K/uL (ref 1.7–7.7)
Neutrophils Relative %: 61 %
Platelets: 228 K/uL (ref 150–400)
RBC: 4.59 MIL/uL (ref 4.22–5.81)
RDW: 12.2 % (ref 11.5–15.5)
WBC: 7 K/uL (ref 4.0–10.5)
nRBC: 0 % (ref 0.0–0.2)

## 2024-03-01 LAB — LIPASE, BLOOD: Lipase: 40 U/L (ref 11–51)

## 2024-03-01 LAB — TROPONIN I (HIGH SENSITIVITY)
Troponin I (High Sensitivity): 7 ng/L (ref <18)
Troponin I (High Sensitivity): 10 ng/L (ref <18)

## 2024-03-01 LAB — ETHANOL: Alcohol, Ethyl (B): <15 mg/dL (ref <15)

## 2024-03-01 MED ORDER — MECLIZINE HCL 25 MG PO TABS
25.0000 mg | ORAL_TABLET | Freq: Once | ORAL | Status: AC
  Administered 2024-03-01: 25 mg via ORAL
  Filled 2024-03-01: qty 1

## 2024-03-01 MED ORDER — MECLIZINE HCL 25 MG PO TABS
12.5000 mg | ORAL_TABLET | Freq: Three times a day (TID) | ORAL | 0 refills | Status: DC | PRN
  Filled 2024-03-01: qty 15, 10d supply, fill #0

## 2024-03-01 MED ORDER — IOHEXOL 350 MG/ML SOLN
75.0000 mL | Freq: Once | INTRAVENOUS | Status: AC | PRN
  Administered 2024-03-01: 75 mL via INTRAVENOUS

## 2024-03-01 NOTE — ED Provider Notes (Signed)
 [place] EMERGENCY DEPARTMENT AT [facility] Provider Note   CSN: [id number] Arrival date & time: 03/01/24  9273     Patient presents with: Hypertension   Kirk Lyons is a 53 y.o. male.   53 year old male with prior medical history as detailed below presents for evaluation.  Patient reports acute onset of dizziness and vertiginous symptoms this morning.  Patient reports that he was fine when he went to bed last night at 1230.  When he awoke this morning, patient noticed significant difficulty ambulating secondary to vertiginous symptoms including dizziness.  Symptoms were present when he awoke. Patient reports that he had to hold onto the wall in order to make it to the bathroom.  He called EMS shortly after discovering his symptoms.  He denies prior episodes of vertigo.  He denies headache, chest pain, shortness of breath, nausea, vomiting, other complaint.  He feels mildly improved upon arrival to the ED.    The history is provided by the patient and medical records.       Prior to Admission medications   Medication Sig Start Date End Date Taking? Authorizing Provider  albuterol  (VENTOLIN  HFA) 108 (90 Base) MCG/ACT inhaler Inhale 2 puffs into the lungs every 6 (six) hours as needed for wheezing or shortness of breath. 05/22/23   Butler, Kristina, FNP  carvedilol  (COREG ) 6.25 MG tablet Take 0.5 tablets (3.125 mg total) by mouth 2 (two) times daily. 05/21/23   Donette Ellouise LABOR, FNP  colchicine  0.6 MG tablet Take 2 tablets by mouth now and then 1 tablet one hour later. 02/27/24   Roselyn Carlin NOVAK, MD  diclofenac  Sodium (VOLTAREN ) 1 % GEL Apply 2 g topically 4 (four) times daily. 01/16/24   Jerrell Cleatus Ned, MD  empagliflozin  (JARDIANCE ) 10 MG TABS tablet Take 1 tablet (10 mg total) by mouth daily. 05/21/23   Donette Ellouise LABOR, FNP  metFORMIN  (GLUCOPHAGE ) 500 MG tablet Take 1 tablet (500 mg total) by mouth 2 (two) times daily with a meal. 01/16/24   Jerrell Cleatus Ned, MD   Multiple Vitamins-Minerals (EMERGEN-C VITAMIN C) PACK Take 1 Package by mouth as needed (immune system support).    [provider]  oxyCODONE -acetaminophen  (PERCOCET/ROXICET) 5-325 MG tablet Take 1 tablet by mouth every 6 (six) hours as needed for severe pain (pain score 7-10). 02/27/24   Roselyn Carlin NOVAK, MD  predniSONE  (STERAPRED UNI-PAK 21 TAB) 10 MG (21) TBPK tablet Take 6 tablets by mouth on day 1, 5 tabs on day 2, 4 tabs on day 3, 3 tabs on day 4, 2 tabs on day 5, 1 tab on day 6. Then stop. 02/27/24   Roselyn Carlin NOVAK, MD  rosuvastatin  (CRESTOR ) 40 MG tablet Take 1 tablet (40 mg total) by mouth daily. 12/28/22 01/16/24  Towana Small, FNP  sacubitril -valsartan  (ENTRESTO ) 97-103 MG Take 1 tablet by mouth 2 (two) times daily. 08/28/23   Donette Ellouise LABOR, FNP    Allergies: Erythromycin    Review of Systems  All other systems reviewed and are negative.   Updated Vital Signs BP (!) 146/110 (BP Location: Right Arm)   Resp 18   SpO2 95%   Physical Exam Vitals and nursing note reviewed.  Constitutional:      General: He is not in acute distress.    Appearance: Normal appearance. He is well-developed.  HENT:     Head: Normocephalic and atraumatic.  Eyes:     Conjunctiva/sclera: Conjunctivae normal.     Pupils: Pupils are equal, round,  and reactive to light.  Cardiovascular:     Rate and Rhythm: Normal rate and regular rhythm.     Heart sounds: Normal heart sounds.  Pulmonary:     Effort: Pulmonary effort is normal. No respiratory distress.     Breath sounds: Normal breath sounds.  Abdominal:     General: There is no distension.     Palpations: Abdomen is soft.     Tenderness: There is no abdominal tenderness.  Musculoskeletal:        General: No deformity. Normal range of motion.     Cervical back: Normal range of motion and neck supple.  Skin:    General: Skin is warm and dry.  Neurological:     General: No focal deficit present.     Mental Status: He is alert  and oriented to person, place, and time. Mental status is at baseline.     Cranial Nerves: No cranial nerve deficit.     Motor: No weakness.     (all labs ordered are listed, but only abnormal results are displayed) Labs Reviewed  COMPREHENSIVE METABOLIC PANEL WITH GFR  LIPASE, BLOOD  CBC WITH DIFFERENTIAL/PLATELET  ETHANOL  TROPONIN I (HIGH SENSITIVITY)    EKG: None  Radiology: No results found.   Procedures   Medications Ordered in the ED  meclizine  (ANTIVERT ) tablet 25 mg (has no administration in time range)                                    Medical Decision Making Patient presents with constellation of symptoms most consistent with likely vertigo.  Symptoms resolved completely with meclizine .  Initial CTA does not show acute pathology.  MRI was partially completed.  Patient would not remain still enough for study to be complete.  On reevaluation after MRI patient again denies any significant symptoms.  He now desires discharge home.  He declines additional workup and/or observation.  All imaging and lab results discussed with the patient.  Importance of close follow-up is stressed.  Strict return precautions given as understood.    Amount and/or Complexity of Data Reviewed Labs: ordered. Radiology: ordered.  Risk Prescription drug management.   CRITICAL CARE Performed by: Maude JAYSON Galloway   Total critical care time: 30 minutes  Critical care time was exclusive of separately billable procedures and treating other patients.  Critical care was necessary to treat or prevent imminent or life-threatening deterioration.  Critical care was time spent personally by me on the following activities: development of treatment plan with patient and/or surrogate as well as nursing, discussions with consultants, evaluation of patient's response to treatment, examination of patient, obtaining history from patient or surrogate, ordering and performing treatments and  interventions, ordering and review of laboratory studies, ordering and review of radiographic studies, pulse oximetry and re-evaluation of patient's condition.      Final diagnoses:  Vertigo    ED Discharge Orders          Ordered    meclizine  (ANTIVERT ) 25 MG tablet  3 times daily PRN        03/01/24 1300               Galloway Maude JAYSON, MD 03/01/24 1515

## 2024-03-01 NOTE — ED Notes (Signed)
Pt to Mri  

## 2024-03-01 NOTE — Discharge Instructions (Signed)
 Return for any problem.  ?

## 2024-03-01 NOTE — ED Triage Notes (Signed)
 [facility] brought in from home; woke up w dizziness, hard time ambulating around home; felt ok when went to bed; no hx of stroke-like symptoms; dry-mouth; been hypertensive; no complaints of headache, chest pain, or SOB  18G located in L AC  Hx of heart failure, diabetes, smoker, alcohol BP: 230/110;  HR: 116 CBG: 143 O2: 92%

## 2024-03-04 ENCOUNTER — Other Ambulatory Visit (HOSPITAL_BASED_OUTPATIENT_CLINIC_OR_DEPARTMENT_OTHER)

## 2024-03-06 ENCOUNTER — Telehealth: Payer: Self-pay

## 2024-03-06 ENCOUNTER — Other Ambulatory Visit (HOSPITAL_BASED_OUTPATIENT_CLINIC_OR_DEPARTMENT_OTHER): Payer: Self-pay

## 2024-03-06 ENCOUNTER — Ambulatory Visit: Admitting: Student in an Organized Health Care Education/Training Program

## 2024-03-06 DIAGNOSIS — E7800 Pure hypercholesterolemia, unspecified: Secondary | ICD-10-CM

## 2024-03-06 MED ORDER — ROSUVASTATIN CALCIUM 40 MG PO TABS
40.0000 mg | ORAL_TABLET | Freq: Every day | ORAL | 3 refills | Status: DC
  Filled 2024-03-06: qty 90, 90d supply, fill #0

## 2024-03-06 NOTE — Progress Notes (Signed)
 Pt called stating he wants his echo to be done in [place] because he lives there. Pt also requested refill on Rosuvastatin  40 MG. Pt;s Echo already placed for [place]. Will send to [facility] for pre-cert. Refill sent.

## 2024-03-07 ENCOUNTER — Other Ambulatory Visit (HOSPITAL_BASED_OUTPATIENT_CLINIC_OR_DEPARTMENT_OTHER): Payer: Self-pay

## 2024-03-07 ENCOUNTER — Encounter (HOSPITAL_BASED_OUTPATIENT_CLINIC_OR_DEPARTMENT_OTHER): Payer: Self-pay

## 2024-03-07 ENCOUNTER — Emergency Department (HOSPITAL_BASED_OUTPATIENT_CLINIC_OR_DEPARTMENT_OTHER)
Admission: EM | Admit: 2024-03-07 | Discharge: 2024-03-07 | Disposition: A | Discharge status: Home / Self Care | Attending: Emergency Medicine | Admitting: Emergency Medicine

## 2024-03-07 ENCOUNTER — Other Ambulatory Visit: Payer: Self-pay

## 2024-03-07 ENCOUNTER — Emergency Department (HOSPITAL_BASED_OUTPATIENT_CLINIC_OR_DEPARTMENT_OTHER): Admitting: Radiology

## 2024-03-07 DIAGNOSIS — M722 Plantar fascial fibromatosis: Secondary | ICD-10-CM | POA: Diagnosis not present

## 2024-03-07 DIAGNOSIS — M2012 Hallux valgus (acquired), left foot: Secondary | ICD-10-CM | POA: Diagnosis not present

## 2024-03-07 DIAGNOSIS — Z7984 Long term (current) use of oral hypoglycemic drugs: Secondary | ICD-10-CM | POA: Diagnosis not present

## 2024-03-07 DIAGNOSIS — M79672 Pain in left foot: Secondary | ICD-10-CM | POA: Insufficient documentation

## 2024-03-07 DIAGNOSIS — M7732 Calcaneal spur, left foot: Secondary | ICD-10-CM | POA: Diagnosis not present

## 2024-03-07 MED ORDER — HYDROCODONE-ACETAMINOPHEN 5-325 MG PO TABS
1.0000 | ORAL_TABLET | Freq: Once | ORAL | Status: AC
  Administered 2024-03-07: 1 via ORAL
  Filled 2024-03-07: qty 1

## 2024-03-07 MED ORDER — HYDROCODONE-ACETAMINOPHEN 5-325 MG PO TABS
1.0000 | ORAL_TABLET | Freq: Four times a day (QID) | ORAL | 0 refills | Status: DC | PRN
  Filled 2024-03-07: qty 5, 2d supply, fill #0

## 2024-03-07 NOTE — Discharge Instructions (Addendum)
 Evaluation today was overall reassuring but do have some concern for possible plantar fasciitis given the nature of your pain about your heel.  Please follow-up with podiatry.  In the meantime you can take Tylenol  and I did send a few tablets of Norco to your pharmacy.  Also recommend applying ice 3-4 times a day.

## 2024-03-07 NOTE — ED Triage Notes (Addendum)
 Arrives POV with complaints of left foot pain x3 days. Patient reports no falls or injuries. Recently diagnosed with gout in his right foot.

## 2024-03-07 NOTE — ED Notes (Signed)
 Discharge instructions, follow up care with podiatry, and prescription reviewed and explained, pt verbalized understanding and had no further questions on d/c. CAM boot and crutches provided.

## 2024-03-07 NOTE — ED Provider Notes (Signed)
 [place] EMERGENCY DEPARTMENT AT [facility] Provider Note   CSN: [id number] Arrival date & time: 03/07/24  1214     Patient presents with: Foot Pain (left)  HPI Kirk Lyons is a 53 y.o. male presenting for left foot pain for 3 days.  Pain is localized about the left heel.  He states he was walking around the pool and immediately felt a sharp pain in the left heel that radiated up the back of his calf.  States he is having difficulty ambulating or bearing weight.    Foot Pain       Prior to Admission medications   Medication Sig Start Date End Date Taking? Authorizing Provider  HYDROcodone -acetaminophen  (NORCO/VICODIN) 5-325 MG tablet Take 1 tablet by mouth every 6 (six) hours as needed for up to 5 doses. 03/07/24  Yes Tishawna Larouche K, PA-C  albuterol  (VENTOLIN  HFA) 108 (90 Base) MCG/ACT inhaler Inhale 2 puffs into the lungs every 6 (six) hours as needed for wheezing or shortness of breath. 05/22/23   Butler, Kristina, FNP  carvedilol  (COREG ) 6.25 MG tablet Take 0.5 tablets (3.125 mg total) by mouth 2 (two) times daily. 05/21/23   Donette Ellouise LABOR, FNP  colchicine  0.6 MG tablet Take 2 tablets by mouth now and then 1 tablet one hour later. 02/27/24   Roselyn Carlin NOVAK, MD  diclofenac  Sodium (VOLTAREN ) 1 % GEL Apply 2 g topically 4 (four) times daily. 01/16/24   Jerrell Cleatus Ned, MD  empagliflozin  (JARDIANCE ) 10 MG TABS tablet Take 1 tablet (10 mg total) by mouth daily. 05/21/23   Donette Ellouise LABOR, FNP  meclizine  (ANTIVERT ) 25 MG tablet Take 1/2 tablet (12.5 mg total) by mouth 3 (three) times daily as needed for dizziness. 03/01/24   Laurice Maude BROCKS, MD  metFORMIN  (GLUCOPHAGE ) 500 MG tablet Take 1 tablet (500 mg total) by mouth 2 (two) times daily with a meal. 01/16/24   Jerrell Cleatus Ned, MD  Multiple Vitamins-Minerals (EMERGEN-C VITAMIN C) PACK Take 1 Package by mouth as needed (immune system support).    [provider]  oxyCODONE -acetaminophen   (PERCOCET/ROXICET) 5-325 MG tablet Take 1 tablet by mouth every 6 (six) hours as needed for severe pain (pain score 7-10). 02/27/24   Roselyn Carlin NOVAK, MD  predniSONE  (STERAPRED UNI-PAK 21 TAB) 10 MG (21) TBPK tablet Take 6 tablets by mouth on day 1, 5 tabs on day 2, 4 tabs on day 3, 3 tabs on day 4, 2 tabs on day 5, 1 tab on day 6. Then stop. 02/27/24   Roselyn Carlin NOVAK, MD  rosuvastatin  (CRESTOR ) 40 MG tablet Take 1 tablet (40 mg total) by mouth daily. 03/06/24 03/01/25  Donette Ellouise LABOR, FNP  sacubitril -valsartan  (ENTRESTO ) 97-103 MG Take 1 tablet by mouth 2 (two) times daily. 08/28/23   Donette Ellouise LABOR, FNP    Allergies: Erythromycin    Review of Systems See HPI  Updated Vital Signs BP (!) 165/110   Pulse 77   Temp 97.7 F (36.5 C) (Oral)   Resp 18   Ht 6' 1 (1.854 m)   Wt 106.6 kg   SpO2 95%   BMI 31.00 kg/m   Physical Exam Constitutional:      Appearance: Normal appearance.  HENT:     Head: Normocephalic.     Nose: Nose normal.  Eyes:     Conjunctiva/sclera: Conjunctivae normal.  Pulmonary:     Effort: Pulmonary effort is normal.  Musculoskeletal:     Left ankle:  Left Achilles Tendon: Normal. No tenderness or defects. Thompson's test negative.       Feet:  Feet:     Comments: Pulses are 2+ bilaterally.  No obvious deformity noted in the feet.  No erythema or edema noted.  Range of motion of the ankle and toes are normal. Neurological:     Mental Status: He is alert.  Psychiatric:        Mood and Affect: Mood normal.     (all labs ordered are listed, but only abnormal results are displayed) Labs Reviewed - No data to display  EKG: None  Radiology: DG Foot Complete Left Result Date: 03/07/2024 CLINICAL DATA:  Left foot pain for 3 days.  No known injury. EXAM: LEFT FOOT - COMPLETE 3+ VIEW COMPARISON:  01/16/2024. FINDINGS: No acute fracture or dislocation. No aggressive osseous lesion. Mild hallux valgus deformity noted. No significant arthritis. No  periarticular osteopenia or bony erosions. Periarticular soft tissue calcifications. Calcaneal spur noted along the Plantar aponeurosis attachment site. No focal soft tissue swelling. No radiopaque foreign bodies. IMPRESSION: No acute osseous abnormality of the left foot. Electronically Signed   By: Ree Molt M.D.   On: 03/07/2024 13:20     Procedures   Medications Ordered in the ED  HYDROcodone -acetaminophen  (NORCO/VICODIN) 5-325 MG per tablet 1 tablet (has no administration in time range)                                    Medical Decision Making Amount and/or Complexity of Data Reviewed Radiology: ordered.   53 year old well-appearing male presenting for foot pain.  Exam notable for tenderness about the left heel.  Gout appears unlikely given lack of clinical findings and where the pain is on his foot.  I do have suspicion for plantar fasciitis.  Placed in a cam boot and gave him crutches as he was having difficulty bearing weight.  Advised him to follow-up with podiatry.  Sent a few tablets of Norco to his pharmacy as he stated that he cannot take NSAIDs with his current medications.  Also recommended RICE treatment at home.  Discharged.     Final diagnoses:  Left foot pain    ED Discharge Orders          Ordered    HYDROcodone -acetaminophen  (NORCO/VICODIN) 5-325 MG tablet  Every 6 hours PRN        03/07/24 1701               Becker Christopher K, PA-C 03/07/24 1701    Patsey Lot, MD 03/08/24 2244

## 2024-03-10 ENCOUNTER — Other Ambulatory Visit (HOSPITAL_BASED_OUTPATIENT_CLINIC_OR_DEPARTMENT_OTHER): Payer: Self-pay

## 2024-03-14 ENCOUNTER — Ambulatory Visit: Admitting: Student in an Organized Health Care Education/Training Program

## 2024-03-21 ENCOUNTER — Telehealth: Payer: Self-pay | Admitting: Family

## 2024-03-21 NOTE — Telephone Encounter (Signed)
 Pt is having trouble getting an EKG scheduled, pt # [phone], please advise

## 2024-03-28 DIAGNOSIS — Z419 Encounter for procedure for purposes other than remedying health state, unspecified: Secondary | ICD-10-CM | POA: Diagnosis not present

## 2024-04-11 NOTE — Telephone Encounter (Signed)
 Copied from CRM [id number]. Topic: Clinical - Medical Advice >> Apr 11, 2024  1:40 PM Rea ORN wrote: Reason for CRM: Pt stated he has sleep apnea and heart failure. Pt wants to advised if he can use a sauna. If so, what would be the maximum temp he can sit in.  Please call back [phone]

## 2024-04-11 NOTE — Telephone Encounter (Signed)
 Regular use of a sauna is likely safe, and may even improve his symptoms. I recommend do not exceed a temperature of 104 F, or no more than 20 minutes sessions.  Strictly avoid any alcohol use in the sauna.  Do not use the sauna if it causes you lightheadedness or chest discomfort.

## 2024-04-11 NOTE — Telephone Encounter (Signed)
 Called patient and let him know your message. Verbalized correction of the temp of 140 degree farenheit. Patient verbalized understanding.

## 2024-04-17 ENCOUNTER — Ambulatory Visit (INDEPENDENT_AMBULATORY_CARE_PROVIDER_SITE_OTHER)

## 2024-04-17 ENCOUNTER — Ambulatory Visit: Payer: Self-pay | Admitting: Family

## 2024-04-17 ENCOUNTER — Other Ambulatory Visit (HOSPITAL_BASED_OUTPATIENT_CLINIC_OR_DEPARTMENT_OTHER): Payer: Self-pay

## 2024-04-17 DIAGNOSIS — I5022 Chronic systolic (congestive) heart failure: Secondary | ICD-10-CM | POA: Diagnosis not present

## 2024-04-17 LAB — ECHOCARDIOGRAM COMPLETE
Area-P 1/2: 4.57 cm2
S' Lateral: 2.8 cm

## 2024-04-18 ENCOUNTER — Other Ambulatory Visit (HOSPITAL_BASED_OUTPATIENT_CLINIC_OR_DEPARTMENT_OTHER): Payer: Self-pay

## 2024-04-24 ENCOUNTER — Telehealth: Payer: Self-pay

## 2024-04-24 ENCOUNTER — Ambulatory Visit: Admitting: Student in an Organized Health Care Education/Training Program

## 2024-04-24 NOTE — Telephone Encounter (Signed)
 Pt states he needs prior authorization for entresto 

## 2024-04-25 ENCOUNTER — Other Ambulatory Visit (HOSPITAL_BASED_OUTPATIENT_CLINIC_OR_DEPARTMENT_OTHER): Payer: Self-pay

## 2024-04-25 ENCOUNTER — Other Ambulatory Visit (HOSPITAL_COMMUNITY): Payer: Self-pay

## 2024-04-25 MED ORDER — ENTRESTO 97-103 MG PO TABS
1.0000 | ORAL_TABLET | Freq: Two times a day (BID) | ORAL | 11 refills | Status: DC
  Filled 2024-04-25 – 2024-05-14 (×2): qty 60, 30d supply, fill #0

## 2024-04-25 NOTE — Telephone Encounter (Signed)
 Changed entresto  from generic to brand per insurance preference.

## 2024-05-05 ENCOUNTER — Other Ambulatory Visit (HOSPITAL_BASED_OUTPATIENT_CLINIC_OR_DEPARTMENT_OTHER): Payer: Self-pay

## 2024-05-14 ENCOUNTER — Other Ambulatory Visit (HOSPITAL_BASED_OUTPATIENT_CLINIC_OR_DEPARTMENT_OTHER): Payer: Self-pay

## 2024-05-22 ENCOUNTER — Other Ambulatory Visit: Payer: Self-pay

## 2024-05-22 ENCOUNTER — Emergency Department (HOSPITAL_BASED_OUTPATIENT_CLINIC_OR_DEPARTMENT_OTHER): Admission: EM | Admit: 2024-05-22 | Discharge: 2024-05-22 | Disposition: A | Discharge status: Home / Self Care

## 2024-05-22 ENCOUNTER — Other Ambulatory Visit (HOSPITAL_BASED_OUTPATIENT_CLINIC_OR_DEPARTMENT_OTHER): Payer: Self-pay

## 2024-05-22 ENCOUNTER — Encounter (HOSPITAL_BASED_OUTPATIENT_CLINIC_OR_DEPARTMENT_OTHER): Payer: Self-pay | Admitting: Emergency Medicine

## 2024-05-22 ENCOUNTER — Emergency Department (HOSPITAL_BASED_OUTPATIENT_CLINIC_OR_DEPARTMENT_OTHER)

## 2024-05-22 DIAGNOSIS — I10 Essential (primary) hypertension: Secondary | ICD-10-CM | POA: Diagnosis not present

## 2024-05-22 DIAGNOSIS — M10071 Idiopathic gout, right ankle and foot: Secondary | ICD-10-CM | POA: Diagnosis not present

## 2024-05-22 DIAGNOSIS — E119 Type 2 diabetes mellitus without complications: Secondary | ICD-10-CM | POA: Diagnosis not present

## 2024-05-22 DIAGNOSIS — M79671 Pain in right foot: Secondary | ICD-10-CM | POA: Diagnosis present

## 2024-05-22 DIAGNOSIS — M19071 Primary osteoarthritis, right ankle and foot: Secondary | ICD-10-CM | POA: Diagnosis not present

## 2024-05-22 DIAGNOSIS — M10171 Lead-induced gout, right ankle and foot: Secondary | ICD-10-CM | POA: Insufficient documentation

## 2024-05-22 DIAGNOSIS — Z0389 Encounter for observation for other suspected diseases and conditions ruled out: Secondary | ICD-10-CM | POA: Diagnosis not present

## 2024-05-22 DIAGNOSIS — Z79899 Other long term (current) drug therapy: Secondary | ICD-10-CM | POA: Diagnosis not present

## 2024-05-22 DIAGNOSIS — Z7984 Long term (current) use of oral hypoglycemic drugs: Secondary | ICD-10-CM | POA: Insufficient documentation

## 2024-05-22 DIAGNOSIS — T560X1A Toxic effect of lead and its compounds, accidental (unintentional), initial encounter: Secondary | ICD-10-CM | POA: Insufficient documentation

## 2024-05-22 MED ORDER — OXYCODONE-ACETAMINOPHEN 5-325 MG PO TABS
1.0000 | ORAL_TABLET | Freq: Once | ORAL | Status: AC
  Administered 2024-05-22: 1 via ORAL
  Filled 2024-05-22: qty 1

## 2024-05-22 MED ORDER — OXYCODONE-ACETAMINOPHEN 5-325 MG PO TABS
1.0000 | ORAL_TABLET | Freq: Four times a day (QID) | ORAL | 0 refills | Status: AC | PRN
  Filled 2024-05-22: qty 15, 4d supply, fill #0

## 2024-05-22 MED ORDER — COLCHICINE 0.6 MG PO TABS
ORAL_TABLET | ORAL | 0 refills | Status: AC
  Filled 2024-05-22: qty 30, 30d supply, fill #0

## 2024-05-22 NOTE — ED Provider Notes (Cosign Needed Addendum)
 [place] [facility]ERGENCY DEPARTMENT AT [facility] Provider Note   CSN: [id number] Arrival date & time: 05/22/24  1257     Patient presents with: Foot Pain   Kirk Lyons is a 53 y.o. male with past medical history of HF, OSA, HTN, tobacco use, T2DM, HLD, gout presents Emergency Department for evaluation of right foot pain that started at 0400 this morning.  Pain is located on dorsal aspect of right foot.  Feels similar to gout flares in the past. Does not take preventative gout medications. No known triggers. Has been adhering to healthy diet of chicken and vegetables. Denies recent falls or known traumatic injury.  No fevers.     Foot Pain       Prior to Admission medications   Medication Sig Start Date End Date Taking? Authorizing Provider  colchicine  0.6 MG tablet Take 2 tablets by mouth then take one tablet one hour later. Take one tablet daily starting tomorrow 05/22/24  Yes Tinaya Ceballos E, PA  oxyCODONE -acetaminophen  (PERCOCET/ROXICET) 5-325 MG tablet Take 1 tablet by mouth every 6 (six) hours as needed for up to 5 days for severe pain (pain score 7-10). 05/22/24 05/27/24 Yes Minnie Tinnie BRAVO, PA  albuterol  (VENTOLIN  HFA) 108 (90 Base) MCG/ACT inhaler Inhale 2 puffs into the lungs every 6 (six) hours as needed for wheezing or shortness of breath. 05/22/23   Butler, Kristina, FNP  carvedilol  (COREG ) 6.25 MG tablet Take 0.5 tablets (3.125 mg total) by mouth 2 (two) times daily. 05/21/23   Donette Ellouise LABOR, FNP  diclofenac  Sodium (VOLTAREN ) 1 % GEL Apply 2 g topically 4 (four) times daily. 01/16/24   Jerrell Cleatus Ned, MD  empagliflozin  (JARDIANCE ) 10 MG TABS tablet Take 1 tablet (10 mg total) by mouth daily. 05/21/23   Donette Ellouise LABOR, FNP  ENTRESTO  97-103 MG Take 1 tablet by mouth 2 (two) times daily. 04/25/24   Donette Ellouise LABOR, FNP  HYDROcodone -acetaminophen  (NORCO/VICODIN) 5-325 MG tablet Take 1 tablet by mouth every 6 (six) hours as needed for up to 5 doses. 03/07/24    Robinson, John K, PA-C  meclizine  (ANTIVERT ) 25 MG tablet Take 1/2 tablet (12.5 mg total) by mouth 3 (three) times daily as needed for dizziness. 03/01/24   Laurice Maude BROCKS, MD  metFORMIN  (GLUCOPHAGE ) 500 MG tablet Take 1 tablet (500 mg total) by mouth 2 (two) times daily with a meal. 01/16/24   Jerrell Cleatus Ned, MD  Multiple Vitamins-Minerals (EMERGEN-C VITAMIN C) PACK Take 1 Package by mouth as needed (immune system support).    [provider]  predniSONE  (STERAPRED UNI-PAK 21 TAB) 10 MG (21) TBPK tablet Take 6 tablets by mouth on day 1, 5 tabs on day 2, 4 tabs on day 3, 3 tabs on day 4, 2 tabs on day 5, 1 tab on day 6. Then stop. 02/27/24   Roselyn Carlin NOVAK, MD  rosuvastatin  (CRESTOR ) 40 MG tablet Take 1 tablet (40 mg total) by mouth daily. 03/06/24 03/01/25  Donette Ellouise LABOR, FNP    Allergies: Erythromycin    Review of Systems  Skin:  Positive for color change.    Updated Vital Signs BP (!) 171/124   Pulse 99   Temp (!) 97.5 F (36.4 C) (Oral)   Resp 20   Ht 6' 1 (1.854 m)   Wt 110.2 kg   SpO2 97%   BMI 32.06 kg/m   Physical Exam Vitals and nursing note reviewed.  Constitutional:      General: He is not  in acute distress.    Appearance: Normal appearance.  HENT:     Head: Normocephalic and atraumatic.  Eyes:     Conjunctiva/sclera: Conjunctivae normal.  Cardiovascular:     Rate and Rhythm: Normal rate.     Pulses:          Dorsalis pedis pulses are 2+ on the right side and 2+ on the left side.  Pulmonary:     Effort: Pulmonary effort is normal. No respiratory distress.  Feet:     Comments: Erythema and tenderness to dorsal aspect of right foot.  No fluctuance nor bony tenderness, crepitus.  Sensation 2/2 of feet bilaterally.  ROM of right ankle, digits of right foot WNL Skin:    Coloration: Skin is not jaundiced or pale.  Neurological:     Mental Status: He is alert. Mental status is at baseline.     (all labs ordered are listed, but only abnormal  results are displayed) Labs Reviewed - No data to display  EKG: None  Radiology: No results found.   Medications Ordered in the ED  oxyCODONE -acetaminophen  (PERCOCET/ROXICET) 5-325 MG per tablet 1 tablet (has no administration in time range)                                    Medical Decision Making Amount and/or Complexity of Data Reviewed Radiology: ordered.  Risk Prescription drug management.   Patient presents to the ED for concern of right foot pain, this involves an extensive number of treatment options, and is a complaint that carries with it a high risk of complications and morbidity.  The differential diagnosis includes fracture, dislocation, cellulitis, abscess, gout   Co morbidities that complicate the patient evaluation  Gout.  See HPI   Additional history obtained:  Additional history obtained from Nursing and Outside Medical Records   External records from outside source obtained and reviewed including triage note, ED note from 02/27/2024 for similar complaints of gout     Imaging Studies ordered:  I ordered imaging studies including right foot x-ray I independently visualized and interpreted imaging which showed without acute abnormalities I agree with the radiologist interpretation See interpretation for specific findings    Medicines ordered and prescription drug management:  I ordered medication including colchicine , Percocet for pain, gout Reevaluation of the patient after these medicines showed that the patient improved I have reviewed the patients home medicines and have made adjustments as needed     Problem List / ED Course:  Gout Vital signs notable for hypertension.  Has been sleeping over at a friend's house and did not take his home medications including his blood pressure medications this morning.  No fever no tachycardia.  No complaints of headache, chest pain.  Low suspicion for hypertensive urgency, ICH, ACS and hypertension  likely just related to not taking his morning medications Does have erythema, warmth to dorsal aspect of right foot.  Denies traumatic injury, fevers at home.  Low suspicion for fracture, infection DP 2+.  Well-perfused extremity.  Neurovascularly intact He states that this does feel similar to gout flares in the past.  Will provide analgesia, colchicine  for pain and gout.  He does have a extra steroid burst pack at home that he did not take with his labs gout flare and can take this for this flare Sent analgesia, colchicine  to patient's pharmacy as well as provided 1 dose of Percocet in the Emergency  Department for pain.  Patient is taking Uber home and will not be driving Patient reports pain is improved and is ready to be discharged   Reevaluation:  After the interventions noted above, I reevaluated the patient and found that they have :stayed the same    Dispostion:  After consideration of the diagnostic results and the patients response to treatment, I feel that the patent would benefit from outpatient management with analgesia and PCP follow-up.   Discussed ED workup, disposition, return to ED precautions with patient who expresses understanding agrees with plan.  All questions answered to their satisfaction.  They are agreeable to plan.  Discharge instructions provided on paperwork  Final diagnoses:  Lead-induced acute gout of right foot, initial encounter    ED Discharge Orders          Ordered    colchicine  0.6 MG tablet        05/22/24 1420    oxyCODONE -acetaminophen  (PERCOCET/ROXICET) 5-325 MG tablet  Every 6 hours PRN        05/22/24 1420             Minnie Tinnie BRAVO, PA 05/22/24 1507    Minnie Tinnie BRAVO, PA 05/22/24 1508    Minnie Tinnie BRAVO, PA 05/22/24 1508    Kammerer, Megan L, DO 05/23/24 1305

## 2024-05-22 NOTE — ED Notes (Signed)
 Reviewed AVS/discharge instructions with patient. Time allotted for and all questions answered. Patient is agreeable for d/c and escorted to ED exit by staff.

## 2024-05-22 NOTE — Discharge Instructions (Signed)
 Thank you for letting us  evaluate you today.  Your x-ray was notable for some arthritis in your toe.  Otherwise no acute fracture nor abnormality.  Please take your medications that you were supposed to take today when you get home.  I have sent colchicine  and analgesia to your pharmacy.  Please pick these up  Return to Emergency Department you experience worsening symptoms

## 2024-05-22 NOTE — ED Triage Notes (Signed)
 Pt caox4 ambulatory c/o R foot pain since 0430 this morning, denies injury, PMH gout.

## 2024-05-28 DIAGNOSIS — Z419 Encounter for procedure for purposes other than remedying health state, unspecified: Secondary | ICD-10-CM | POA: Diagnosis not present

## 2024-06-27 DIAGNOSIS — Z419 Encounter for procedure for purposes other than remedying health state, unspecified: Secondary | ICD-10-CM | POA: Diagnosis not present

## 2024-07-08 ENCOUNTER — Telehealth: Payer: Self-pay | Admitting: Family

## 2024-07-08 NOTE — Telephone Encounter (Signed)
 Pt states that he has had a headache on the left side behind his eye for 8 days. Describes it as pounding and throbbing. Pt doesn't believe in taking otc pain meds. Advised pt to follow up with his primary care as this does not sound heart failure related. Pt agreeable to advise and follow up appointment made.

## 2024-07-11 ENCOUNTER — Ambulatory Visit: Payer: Self-pay

## 2024-07-11 NOTE — Telephone Encounter (Signed)
 FYI - appointment scheduled on 07/14/24.

## 2024-07-11 NOTE — Telephone Encounter (Signed)
 FYI Only or Action Required?: FYI only for provider: appointment scheduled on 07/14/24.  Patient was last seen in primary care on 01/16/2024 by Jerrell Cleatus Ned, MD.  Called Nurse Triage reporting Headache.  Symptoms began a week ago.  Interventions attempted: Nothing.  Symptoms are: unchanged.  Triage Disposition: See PCP When Office is Open (Within 3 Days)  Patient/caregiver understands and will follow disposition?: Yes   Copied from CRM #[id number]. Topic: Clinical - Red Word Triage >> Jul 11, 2024 12:51 PM Alfonso HERO wrote: Red Word that prompted transfer to Nurse Triage: patient experiencing severe migraine Reason for Disposition  [1] MILD-MODERATE headache AND [2] present > 3 days (72 hours) AND [3] no improvement after using Care Advice  Answer Assessment - Initial Assessment Questions Scheduled appt 07/14/24. Advised 911 if symptoms worsen. Patient verbalized understanding.  1. LOCATION: Where does it hurt?      Left side temporal and behind left eye 2. ONSET: When did the headache start? (e.g., minutes, hours, days)      2 weeks ago 3. PATTERN: Does the pain come and go, or has it been constant since it started?     Comes and goes 4. SEVERITY: How bad is the pain? and What does it keep you from doing?  (e.g., Scale 1-10; mild, moderate, or severe) 3/10 currently; at worse 7/10 6. CAUSE: What do you think is causing the headache?     usnure 7. MIGRAINE: Have you been diagnosed with migraine headaches? If Yes, ask: Is this headache similar?      no 8. HEAD INJURY: Has there been any recent injury to your head?      no 9. OTHER SYMPTOMS: Do you have any other symptoms? (e.g., fever, stiff neck, eye pain, sore throat, cold symptoms) Denies HA, blurred/double vision, neck pain, fever chills/ n/v  Protocols used: Headache-A-AH

## 2024-07-14 ENCOUNTER — Encounter: Payer: Self-pay | Admitting: Student in an Organized Health Care Education/Training Program

## 2024-07-14 ENCOUNTER — Ambulatory Visit: Admitting: Student in an Organized Health Care Education/Training Program

## 2024-07-14 VITALS — BP 105/70 | HR 98 | Wt 255.0 lb

## 2024-07-14 DIAGNOSIS — E1169 Type 2 diabetes mellitus with other specified complication: Secondary | ICD-10-CM

## 2024-07-14 DIAGNOSIS — I152 Hypertension secondary to endocrine disorders: Secondary | ICD-10-CM

## 2024-07-14 DIAGNOSIS — G4452 New daily persistent headache (NDPH): Secondary | ICD-10-CM | POA: Insufficient documentation

## 2024-07-14 DIAGNOSIS — E1159 Type 2 diabetes mellitus with other circulatory complications: Secondary | ICD-10-CM

## 2024-07-14 DIAGNOSIS — I10 Essential (primary) hypertension: Secondary | ICD-10-CM | POA: Diagnosis not present

## 2024-07-14 DIAGNOSIS — G4733 Obstructive sleep apnea (adult) (pediatric): Secondary | ICD-10-CM

## 2024-07-14 DIAGNOSIS — I5042 Chronic combined systolic (congestive) and diastolic (congestive) heart failure: Secondary | ICD-10-CM | POA: Diagnosis not present

## 2024-07-14 DIAGNOSIS — E7800 Pure hypercholesterolemia, unspecified: Secondary | ICD-10-CM | POA: Diagnosis not present

## 2024-07-14 NOTE — Assessment & Plan Note (Signed)
 Chronic and stable.  Blood pressure well-controlled today.  Plan to continue with Entresto  and check BMP today.
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
## 2024-07-14 NOTE — Assessment & Plan Note (Signed)
 He has experienced a pressure-like, constant headache for two weeks, which wakes him from sleep. The differential diagnosis includes stress-related headache, sinus infection, or CNS disease like tumor. A previous MRI was inconclusive due to movement. Stress from his living situation may be a contributing factor.  Giant cell arteritis is possible given the location around his left temple, but seems less likely given his age.  Will check ESR and CRP today.  Because of his red flags including a new headache after the age of 23 as well as headache waking him from sleep, I think there is risk for a CNS space-occupying lesion and I have ordered an MRI of the brain to rule out a CNS tumor. Recommended Tylenol  1000 mg three times a day for three days.  We talked about lifestyle modifications including improved sleep, hydration, nutrition, and reducing stress at home.

## 2024-07-14 NOTE — Patient Instructions (Signed)
" °  VISIT SUMMARY: During your visit, we discussed your persistent headache, diabetes management, heart failure, and sleep apnea. We have ordered some tests to better understand your condition and adjust your treatment plan as needed.  YOUR PLAN: -NEW DAILY PERSISTENT HEADACHE: You have been experiencing a constant headache for two weeks, which may be due to stress, a sinus infection, or other causes. We have ordered blood work and a new MRI of your brain to investigate further. In the meantime, you can take Tylenol  1000 mg three times a day for three days to help manage the pain.  -TYPE 2 DIABETES MELLITUS: Your diabetes is currently managed with Metformin  500 mg twice daily. We have ordered blood work to assess your blood sugar levels and ensure your diabetes is well-controlled.  -HEART FAILURE WITH IMPROVED EJECTION FRACTION: Your heart function has improved, as shown by your recent echocardiogram. You should continue taking Entresto  as prescribed, and there is no need for additional diuretics at this time.  -OBSTRUCTIVE SLEEP APNEA: You are using a CPAP machine to help manage your sleep apnea. Continue using the CPAP machine as much as you can to improve your sleep quality.  INSTRUCTIONS: Please complete the blood work and MRI as soon as possible. Follow up with us  once the results are available so we can adjust your treatment plan if needed.   "

## 2024-07-14 NOTE — Assessment & Plan Note (Signed)
 Chronic and stable.  Echocardiogram in October showed that he now has a recovered ejection fraction.  He is euvolemic on exam today.  I do not think he needs diuretics currently.  I recommended continuing with goal-directed medical therapy including Entresto , carvedilol , and Jardiance .

## 2024-07-14 NOTE — Assessment & Plan Note (Addendum)
 Chronic obstructive sleep apnea and struggling with management with CPAP therapy through nasal prongs.  This may be contributing to his issue with new daily headaches.  Unfortunately no easy issue.  Perhaps can work with his DME company to find a more comfortable face mask.

## 2024-07-14 NOTE — Assessment & Plan Note (Signed)
 Chronic and stable.  Doing well on metformin  and Jardiance .  Will check A1c today.

## 2024-07-15 LAB — HEMOGLOBIN A1C: Hgb A1c MFr Bld: 6.9 % — ABNORMAL HIGH (ref 4.6–6.5)

## 2024-07-15 LAB — BASIC METABOLIC PANEL WITH GFR
BUN: 23 mg/dL (ref 6–23)
CO2: 26 meq/L (ref 19–32)
Calcium: 8.5 mg/dL (ref 8.4–10.5)
Chloride: 105 meq/L (ref 96–112)
Creatinine, Ser: 1.28 mg/dL (ref 0.40–1.50)
GFR: 63.94 mL/min
Glucose, Bld: 112 mg/dL — ABNORMAL HIGH (ref 70–99)
Potassium: 4.5 meq/L (ref 3.5–5.1)
Sodium: 141 meq/L (ref 135–145)

## 2024-07-15 LAB — C-REACTIVE PROTEIN: CRP: <0.5 mg/dL — ABNORMAL LOW (ref 1.0–20.0)

## 2024-07-15 LAB — LIPID PANEL
Cholesterol: 231 mg/dL — ABNORMAL HIGH (ref 28–200)
HDL: 75.6 mg/dL
NonHDL: 155.44
Total CHOL/HDL Ratio: 3
Triglycerides: 461 mg/dL — ABNORMAL HIGH (ref 10.0–149.0)
VLDL: 92.2 mg/dL — ABNORMAL HIGH (ref 0.0–40.0)

## 2024-07-15 LAB — LDL CHOLESTEROL, DIRECT: Direct LDL: 118 mg/dL

## 2024-07-15 LAB — MAGNESIUM: Magnesium: 2.3 mg/dL (ref 1.5–2.5)

## 2024-07-15 LAB — SEDIMENTATION RATE: Sed Rate: 7 mm/h (ref 0–20)

## 2024-07-16 ENCOUNTER — Ambulatory Visit: Payer: Self-pay | Admitting: Student in an Organized Health Care Education/Training Program

## 2024-07-16 NOTE — Progress Notes (Signed)
 Called patient and left vm to return call. Also let him know that I sent a password reset link to his email. If patient calls back, please relay Dr. Gaylin message below.     Mr. Vavra,   It was good seeing you in the clinic yesterday.  Thank you for having your blood work drawn.  Kidney function and electrolytes look normal.  Your blood sugar and A1c are in a good range.  Cholesterol levels are slightly above goal, I recommend continuing the rosuvastatin  on a daily basis and work to improve the quality of your diet.  I do not see an explanation for your new headache on your blood work.  I will call you when I see the results of the MRI.  Please keep me updated how you are feeling and tell me if you have questions.   Best,   Cleatus Specking, MD

## 2024-07-16 NOTE — Telephone Encounter (Signed)
 Copied from CRM #[id number]. Topic: Clinical - Lab/Test Results >> Jul 15, 2024  5:19 PM Ashley R wrote: Reason for CRM: callback [phone] with results - unable to access mychart

## 2024-07-16 NOTE — Telephone Encounter (Unsigned)
 Copied from CRM #[id number]. Topic: Clinical - Lab/Test Results >> Jul 16, 2024 11:41 AM Viola FALCON wrote: Reason for CRM: Patient returned Meighans call - relayed lab results he had no further questions

## 2024-07-23 ENCOUNTER — Encounter: Payer: Self-pay | Admitting: Student in an Organized Health Care Education/Training Program

## 2024-07-28 ENCOUNTER — Ambulatory Visit
Admission: RE | Admit: 2024-07-28 | Discharge: 2024-07-28 | Disposition: A | Source: Ambulatory Visit | Attending: Student in an Organized Health Care Education/Training Program

## 2024-07-28 DIAGNOSIS — G4452 New daily persistent headache (NDPH): Secondary | ICD-10-CM

## 2024-07-29 NOTE — Telephone Encounter (Signed)
 Patient would like the option to be put to sleep to complete his MRI  Copied from CRM #[id number]. Topic: General - Other >> Jul 29, 2024 12:51 PM Mercedes MATSU wrote: Reason for CRM: Patient called in stating that he could not do his MRI because he freaked out in the machine,. He said they informed him to ask the doctor for a pill to help relax him, but he states that he don't think that will help he said maybe if he could be put to sleep? Patient is requesting a call back from the nurse and can be reached at [phone].

## 2024-07-30 NOTE — Telephone Encounter (Signed)
 Copied from CRM #[id number]. Topic: General - Other >> Jul 30, 2024 10:55 AM Roselie BROCKS wrote: Patient called ,I provided the message from Dr Jerrell. The patient states he is deathly claustrophobic and has panic attacks in the MRI. And Patient states Ativan does nothing for him, it will not help him during a MRI, patient stated he will probably cancel the MRI.

## 2024-07-30 NOTE — Progress Notes (Signed)
 Called patient and lvmtrc. If patient calls back, please relay message

## 2024-08-04 ENCOUNTER — Ambulatory Visit: Admitting: Student in an Organized Health Care Education/Training Program

## 2024-08-05 ENCOUNTER — Telehealth: Payer: Self-pay | Admitting: Family

## 2024-08-05 NOTE — Telephone Encounter (Signed)
 Called to confirm/remind patient of their appointment [facility] on 08/06/[facility].   Appointment:   [x] Confirmed  [] Left mess   [] No answer/No voice mail  [] VM Full/unable to leave message  [] Phone not in service  Patient reminded to bring all medications and/or complete list.  Confirmed patient has transportation. Gave directions, instructed to utilize valet parking.

## 2024-08-06 ENCOUNTER — Other Ambulatory Visit (HOSPITAL_COMMUNITY): Payer: Self-pay

## 2024-08-06 ENCOUNTER — Other Ambulatory Visit (HOSPITAL_BASED_OUTPATIENT_CLINIC_OR_DEPARTMENT_OTHER): Payer: Self-pay

## 2024-08-06 ENCOUNTER — Encounter: Payer: Self-pay | Admitting: Family

## 2024-08-06 ENCOUNTER — Ambulatory Visit: Attending: Family | Admitting: Family

## 2024-08-06 VITALS — BP 164/120 | HR 85 | Wt 255.6 lb

## 2024-08-06 DIAGNOSIS — I5042 Chronic combined systolic (congestive) and diastolic (congestive) heart failure: Secondary | ICD-10-CM | POA: Diagnosis not present

## 2024-08-06 DIAGNOSIS — I428 Other cardiomyopathies: Secondary | ICD-10-CM | POA: Insufficient documentation

## 2024-08-06 DIAGNOSIS — Z59868 Other specified financial insecurity: Secondary | ICD-10-CM | POA: Insufficient documentation

## 2024-08-06 DIAGNOSIS — I10 Essential (primary) hypertension: Secondary | ICD-10-CM | POA: Diagnosis not present

## 2024-08-06 DIAGNOSIS — Z8249 Family history of ischemic heart disease and other diseases of the circulatory system: Secondary | ICD-10-CM | POA: Insufficient documentation

## 2024-08-06 DIAGNOSIS — F17210 Nicotine dependence, cigarettes, uncomplicated: Secondary | ICD-10-CM | POA: Insufficient documentation

## 2024-08-06 DIAGNOSIS — E119 Type 2 diabetes mellitus without complications: Secondary | ICD-10-CM | POA: Diagnosis not present

## 2024-08-06 DIAGNOSIS — Z79899 Other long term (current) drug therapy: Secondary | ICD-10-CM | POA: Insufficient documentation

## 2024-08-06 DIAGNOSIS — I3139 Other pericardial effusion (noninflammatory): Secondary | ICD-10-CM | POA: Insufficient documentation

## 2024-08-06 DIAGNOSIS — Z7984 Long term (current) use of oral hypoglycemic drugs: Secondary | ICD-10-CM | POA: Insufficient documentation

## 2024-08-06 DIAGNOSIS — E7800 Pure hypercholesterolemia, unspecified: Secondary | ICD-10-CM

## 2024-08-06 DIAGNOSIS — I110 Hypertensive heart disease with heart failure: Secondary | ICD-10-CM | POA: Insufficient documentation

## 2024-08-06 DIAGNOSIS — I5032 Chronic diastolic (congestive) heart failure: Secondary | ICD-10-CM | POA: Diagnosis not present

## 2024-08-06 DIAGNOSIS — G4733 Obstructive sleep apnea (adult) (pediatric): Secondary | ICD-10-CM | POA: Diagnosis not present

## 2024-08-06 DIAGNOSIS — E785 Hyperlipidemia, unspecified: Secondary | ICD-10-CM | POA: Insufficient documentation

## 2024-08-06 MED ORDER — SACUBITRIL-VALSARTAN 97-103 MG PO TABS
1.0000 | ORAL_TABLET | Freq: Two times a day (BID) | ORAL | 0 refills | Status: AC
  Filled 2024-08-06: qty 180, 90d supply, fill #0

## 2024-08-06 MED ORDER — EMPAGLIFLOZIN 10 MG PO TABS
10.0000 mg | ORAL_TABLET | Freq: Every day | ORAL | 0 refills | Status: AC
  Filled 2024-08-06: qty 90, 90d supply, fill #0

## 2024-08-06 MED ORDER — ROSUVASTATIN CALCIUM 40 MG PO TABS
40.0000 mg | ORAL_TABLET | Freq: Every day | ORAL | 0 refills | Status: AC
  Filled 2024-08-06: qty 90, 90d supply, fill #0

## 2024-08-06 MED ORDER — CARVEDILOL 6.25 MG PO TABS
6.2500 mg | ORAL_TABLET | Freq: Two times a day (BID) | ORAL | 3 refills | Status: AC
  Filled 2024-08-06: qty 180, 90d supply, fill #0

## 2024-08-06 NOTE — Progress Notes (Signed)
 "  Advanced Heart Failure Clinic Note   PCP: Jerrell Cleatus Ned, MD Primary Cardiologist: Emelia Hazy, NP   Chief Complaint: fatigue   HPI:  Kirk Lyons is a 54 y.o. male with a history of HTN, gout, tobacco use and chronic heart failure.   Was in the ED 04/03/22 due to left ankle pain where he was evaluated and released. Was in the ED 05/12/22 due to gout where he was treated and released. Was in the ED 01/30/23 due to allergic reaction. Was in the ED 03/19/23 due to gout flare.   Echo 07/20/21: Left Ventricle: Left ventricular ejection fraction, by estimation, is 45 to 50%. The left ventricle has mildly decreased function. The left ventricle demonstrates global hypokinesis. The left ventricular internal cavity size was normal in size. There is mild left ventricular hypertrophy. Left ventricular diastolic parameters are consistent with Grade II diastolic dysfunction (pseudonormalization). Right Ventricle: The right ventricular size is normal. Moderately increased right ventricular wall thickness. Left Atrium: Left atrial size was mildly dilated. Right Atrium: Right atrial size was mildly dilated  Several ED visits due to gout and vertigo. Had brain MRI 01/26 due to daily persistent HA's.   Echo 04/17/24: EF 55-60%, normal RV, small pericardial effusion  He presents today for a HF follow-up visit with a chief complaint of minimal fatigue. Has associated productive cough, mild HA (much improved). Denies shortness of breath, chest pain, palpitations, dizziness or edema. Took his BP meds ~ 1/2 hour before his appointment today. Has CPAP at home but doesn't consistently wear it as he says that he wakes up with it off his face. 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   Did not bring his medications with him.   ROS: All systems negative except as listed in HPI, PMH and Problem List.  SH:   Social History   Socioeconomic History   Marital status: Single    Spouse name: Not on file   Number of children: Not on file   Years of education: Not on file   Highest education level: Not on file  Occupational History   Not on file  Tobacco Use   Smoking status: Some Days    Types: Cigarettes   Smokeless tobacco: Never   Tobacco comments:    May smoke a cigarette if he has a beer  Vaping Use   Vaping status: Never Used  Substance and Sexual Activity   Alcohol use: Yes    Comment: social   Drug use: No   Sexual activity: Not Currently  Other Topics Concern   Not on file  Social History Narrative   Not on file   Social Drivers of Health   Tobacco Use: High Risk (07/14/2024)   Patient History    Smoking Tobacco Use: Some Days    Smokeless Tobacco Use: Never    Passive Exposure: Not on file  Financial Resource Strain: High Risk (12/28/2022)   Overall Financial Resource Strain (CARDIA)    Difficulty of Paying Living Expenses: Very hard  Food Insecurity: Food Insecurity Present (12/28/2022)   Hunger Vital Sign    Worried About Running Out of Food in the Last Year: Sometimes true    Ran Out of Food in the Last Year: Sometimes true  Transportation Needs: No Transportation Needs (01/24/2023)   PRAPARE - Transportation    Lack of Transportation (Medical): No    Lack of  Transportation (Non-Medical): No  Recent Concern: Transportation Needs - Unmet Transportation Needs (01/02/2023)   PRAPARE - Transportation    Lack of Transportation (Medical): No    Lack of Transportation (Non-Medical): Yes  Physical Activity: Not on file  Stress: Stress Concern Present (01/02/2023)   [employer] of Occupational [employer] - Occupational Stress Questionnaire    Feeling of Stress : Very much  Social Connections: Not on file  Intimate Partner Violence: Not on file  Depression (PHQ2-9): Low Risk (02/13/2022)   Depression (PHQ2-9)    PHQ-2 Score: 0  Alcohol Screen: Not on file  Housing:  Not on file  Utilities: Not on file  Health Literacy: Not on file    FH:  Family History  Problem Relation Age of Onset   Breast cancer Mother    Hyperlipidemia Father    Coronary artery disease Father    Prostate cancer Father    Testicular cancer Brother     Past Medical History:  Diagnosis Date   CHF (congestive heart failure) (HCC)    Chronic combined systolic and diastolic heart failure (HCC) 07/12/2021   Chronic diastolic heart failure (HCC) 07/12/2021   Essential hypertension 07/12/2021   Gout    Heart failure, type unknown (HCC) 07/12/2021   Hypertension    Obesity (BMI 30.0-34.9) 07/12/2021   OSA (obstructive sleep apnea) 07/12/2021   Tobacco use 07/12/2021    Current Outpatient Medications  Medication Sig Dispense Refill   albuterol  (VENTOLIN  HFA) 108 (90 Base) MCG/ACT inhaler Inhale 2 puffs into the lungs every 6 (six) hours as needed for wheezing or shortness of breath. 18 g 1   carvedilol  (COREG ) 6.25 MG tablet Take 0.5 tablets (3.125 mg total) by mouth 2 (two) times daily. 90 tablet 2   colchicine  0.6 MG tablet Take 2 tablets by mouth then take one tablet one hour later. Take one tablet daily starting tomorrow 30 tablet 0   diclofenac  Sodium (VOLTAREN ) 1 % GEL Apply 2 g topically 4 (four) times daily. 100 g 1   empagliflozin  (JARDIANCE ) 10 MG TABS tablet Take 1 tablet (10 mg total) by mouth daily. 90 tablet 3   ENTRESTO  97-103 MG Take 1 tablet by mouth 2 (two) times daily. 60 tablet 11   metFORMIN  (GLUCOPHAGE ) 500 MG tablet Take 1 tablet (500 mg total) by mouth 2 (two) times daily with a meal. 180 tablet 1   rosuvastatin  (CRESTOR ) 40 MG tablet Take 1 tablet (40 mg total) by mouth daily. 90 tablet 3   No current facility-administered medications for this visit.   Vitals:   08/06/24 1342 08/06/24 1400  BP: (!) 176/118 (!) 164/120  Pulse: 85   SpO2: 96%   Weight: 255 lb 9.6 oz (115.9 kg)    Wt Readings from Last 3 Encounters:  08/06/24 255 lb 9.6 oz (115.9  kg)  07/14/24 255 lb (115.7 kg)  05/22/24 243 lb (110.2 kg)   Lab Results  Component Value Date   CREATININE 1.28 07/14/2024   CREATININE 1.27 (H) 03/01/2024   CREATININE 1.21 11/09/2023     PHYSICAL EXAM:  General: Well appearing.  Cor: No JVD. Regular rhythm, rate.  Lungs: clear Abdomen: soft, nontender, nondistended. Extremities: no edema Neuro:. Affect pleasant    ECG: not done    ASSESSMENT & PLAN:  1: NICM with now improved ejection fraction- - suspect due to HTN/ untreated OSA - NYHA Lyons II - euvolemic today - weight up 17 pounds from last visit here 9 months ago. Much less physically  active than previously - Echo 07/20/21: EF 45-50% with mild LVH, Grade II DD, mild LAE, mild dilatation of ascending aorta at 38 mm - Echo 04/17/24: EF 55-60%, normal RV, small pericardial effusion - increase carvedilol  to 6.25mg  BID - change furosemide  to 40mg  every other day - continue jardiance  10mg  daily - continue entresto  97/103mg   - discussed adding MRA or amlodipine  for BP control but he doesn't want to take another medication so carvedilol  increased per above - not adding salt and tries to review food labels - saw cardiology Patricio) on 03/23; NS 01/25 - BNP 07/06/21 was 160.6  2: HTN- - BP 164/120. Rechecked after 30 minutes was 152/114. Increasing carvedilol  per above as he doesn't want to start additional medication - saw PCP 12/25 - BMP 07/14/24 reviewed: sodium 141, potassium 4.5, creatinine 1.28 and GFR 63.94   3: Obstructive sleep apnea-  - continues to not wear his CPAP consistently which will play into his HTN. Emphasized the importance of wearing it nightly. - had repeat sleep study on 10/07/21  - saw cardiology Mazie) 09/23; he needs to call her office and schedule f/u appt  4: Diabetes mellitus (managed by PCP)- - A1C on 07/14/24 was 6.9% - continue metformin  500mg  BID  5: HLD- - LDL 07/14/24 was 118 with triglycerides 461 - continue  rosuvastatin     Return in 1 month to get established with HF MD, sooner if needed.   I spent 35 minutes reviewing records, interviewing/ examing patient and managing plan/ orders.   Kirk DELENA Class, FNP 08/06/24  "

## 2024-08-06 NOTE — Patient Instructions (Signed)
 Increase your carvedilol  to 6.25mg  twice daily. This will be 1 tablet in the morning and 1 tablet in the evening.

## 2024-08-07 ENCOUNTER — Other Ambulatory Visit (HOSPITAL_COMMUNITY): Payer: Self-pay

## 2024-08-07 ENCOUNTER — Telehealth: Payer: Self-pay

## 2024-08-07 NOTE — Telephone Encounter (Signed)
 Advanced Heart Failure Patient Advocate Encounter  Prior authorization for Jardiance  has been submitted and approved. Test billing returns $4 for 90 day supply.  Key: [id number] Effective: 08/07/2024 to 08/07/2025  Rachel DEL, CPhT Rx Patient Advocate Phone: [phone]

## 2024-08-08 ENCOUNTER — Telehealth: Payer: Self-pay

## 2024-08-08 ENCOUNTER — Other Ambulatory Visit (HOSPITAL_COMMUNITY): Payer: Self-pay

## 2024-08-12 ENCOUNTER — Other Ambulatory Visit: Payer: Self-pay

## 2024-08-12 ENCOUNTER — Other Ambulatory Visit (HOSPITAL_BASED_OUTPATIENT_CLINIC_OR_DEPARTMENT_OTHER): Payer: Self-pay

## 2024-08-13 ENCOUNTER — Other Ambulatory Visit: Payer: Self-pay | Admitting: Family

## 2024-08-13 ENCOUNTER — Other Ambulatory Visit (HOSPITAL_COMMUNITY): Payer: Self-pay

## 2024-08-13 NOTE — Telephone Encounter (Signed)
 Advanced Heart Failure Patient Advocate Encounter  Prior authorization for Wegovy  has been submitted and approved. Test billing returns $4 for 28 day supply.  KeyBETHA LATCH Effective: 08/13/2024 to 02/09/2025  Rachel DEL, CPhT Rx Patient Advocate Phone: [phone]

## 2024-08-14 ENCOUNTER — Telehealth: Payer: Self-pay

## 2024-08-14 ENCOUNTER — Other Ambulatory Visit (HOSPITAL_BASED_OUTPATIENT_CLINIC_OR_DEPARTMENT_OTHER): Payer: Self-pay

## 2024-08-14 MED ORDER — WEGOVY 0.25 MG/0.5ML ~~LOC~~ SOAJ
0.2500 mg | SUBCUTANEOUS | 1 refills | Status: AC
  Filled 2024-08-14: qty 2, 28d supply, fill #0

## 2024-08-14 NOTE — Telephone Encounter (Signed)
 Orders placed for wegovy  to preferred pharmacy per Ellouise Class, FNP.

## 2024-08-21 ENCOUNTER — Other Ambulatory Visit (HOSPITAL_BASED_OUTPATIENT_CLINIC_OR_DEPARTMENT_OTHER): Payer: Self-pay

## 2024-08-21 ENCOUNTER — Telehealth: Payer: Self-pay

## 2024-08-21 MED ORDER — METFORMIN HCL 500 MG PO TABS
500.0000 mg | ORAL_TABLET | Freq: Two times a day (BID) | ORAL | 1 refills | Status: AC
  Filled 2024-08-21: qty 180, 90d supply, fill #0

## 2024-08-21 NOTE — Telephone Encounter (Signed)
 Copied from CRM #[id number]. Topic: Clinical - Medication Refill >> Aug 21, 2024 12:55 PM Alfonso ORN wrote: Medication: metFORMIN  (GLUCOPHAGE ) 500 MG tablet ( pt requesting 90 day supply )  albuterol  (VENTOLIN  HFA) 108 (90 Base) MCG/ACT inhaler  Has the patient contacted their pharmacy? no  This is the patient's preferred pharmacy:  [facility] [address] [place] [address] Phone: [phone] Fax: [phone]  Is this the correct pharmacy for this prescription? Yes If no, delete pharmacy and type the correct one.   Has the prescription been filled recently? No  Is the patient out of the medication? No  Has the patient been seen for an appointment in the last year OR does the patient have an upcoming appointment? Yes  Can we respond through MyChart? no

## 2024-09-08 ENCOUNTER — Ambulatory Visit (HOSPITAL_COMMUNITY): Admitting: Cardiology

## 2024-10-20 ENCOUNTER — Ambulatory Visit: Admitting: Student in an Organized Health Care Education/Training Program
